# Patient Record
Sex: Female | Born: 1960 | Race: White | Hispanic: No | Marital: Married | Smoking: Never smoker
Health system: Southern US, Community
[De-identification: ages and names within clinical notes are randomized; demographics above are authoritative.]

## PROBLEM LIST (undated history)

## (undated) DIAGNOSIS — T7840XA Allergy, unspecified, initial encounter: Secondary | ICD-10-CM

## (undated) DIAGNOSIS — C4491 Basal cell carcinoma of skin, unspecified: Secondary | ICD-10-CM

## (undated) DIAGNOSIS — E559 Vitamin D deficiency, unspecified: Secondary | ICD-10-CM

## (undated) DIAGNOSIS — Z86018 Personal history of other benign neoplasm: Secondary | ICD-10-CM

## (undated) DIAGNOSIS — Z85828 Personal history of other malignant neoplasm of skin: Secondary | ICD-10-CM

## (undated) DIAGNOSIS — G4730 Sleep apnea, unspecified: Secondary | ICD-10-CM

## (undated) DIAGNOSIS — M1990 Unspecified osteoarthritis, unspecified site: Secondary | ICD-10-CM

## (undated) DIAGNOSIS — R002 Palpitations: Secondary | ICD-10-CM

## (undated) HISTORY — PX: MYOMECTOMY: SHX85

## (undated) HISTORY — PX: SPINE SURGERY: SHX786

## (undated) HISTORY — PX: JOINT REPLACEMENT: SHX530

## (undated) HISTORY — DX: Allergy, unspecified, initial encounter: T78.40XA

## (undated) HISTORY — DX: Vitamin D deficiency, unspecified: E55.9

## (undated) HISTORY — PX: LAPAROSCOPY: SHX197

## (undated) HISTORY — PX: BIOPSY BREAST: PRO8

## (undated) HISTORY — PX: THYMECTOMY: SHX1063

## (undated) HISTORY — PX: TONSILLECTOMY: SUR1361

## (undated) HISTORY — PX: BACK SURGERY: SHX140

## (undated) HISTORY — PX: ANTERIOR CRUCIATE LIGAMENT REPAIR: SHX115

## (undated) HISTORY — PX: PAROTIDECTOMY: SUR1003

## (undated) HISTORY — DX: Sleep apnea, unspecified: G47.30

## (undated) HISTORY — PX: ARTHROSCOPIC REPAIR ACL: SUR80

---
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
## 2001-05-04 DIAGNOSIS — C4491 Basal cell carcinoma of skin, unspecified: Secondary | ICD-10-CM

## 2001-05-04 DIAGNOSIS — Z85828 Personal history of other malignant neoplasm of skin: Secondary | ICD-10-CM

## 2001-05-04 HISTORY — DX: Basal cell carcinoma of skin, unspecified: C44.91

## 2001-05-04 HISTORY — DX: Personal history of other malignant neoplasm of skin: Z85.828

## 2004-03-09 ENCOUNTER — Ambulatory Visit: Payer: Self-pay

## 2005-04-21 ENCOUNTER — Ambulatory Visit: Payer: Self-pay

## 2006-04-27 ENCOUNTER — Ambulatory Visit: Payer: Self-pay

## 2006-05-08 ENCOUNTER — Ambulatory Visit: Payer: Self-pay

## 2007-01-24 ENCOUNTER — Ambulatory Visit: Payer: Self-pay | Admitting: General Surgery

## 2007-07-26 ENCOUNTER — Ambulatory Visit: Payer: Self-pay | Admitting: Family Medicine

## 2008-02-22 HISTORY — PX: BREAST BIOPSY: SHX20

## 2008-11-25 ENCOUNTER — Ambulatory Visit: Payer: Self-pay | Admitting: Family Medicine

## 2008-12-04 ENCOUNTER — Ambulatory Visit: Payer: Self-pay | Admitting: Family Medicine

## 2009-02-21 ENCOUNTER — Ambulatory Visit: Payer: Self-pay | Admitting: Internal Medicine

## 2009-02-23 DIAGNOSIS — Z86018 Personal history of other benign neoplasm: Secondary | ICD-10-CM

## 2009-02-23 HISTORY — DX: Personal history of other benign neoplasm: Z86.018

## 2009-03-02 ENCOUNTER — Emergency Department: Payer: Self-pay | Admitting: Emergency Medicine

## 2009-03-05 ENCOUNTER — Ambulatory Visit: Payer: Self-pay | Admitting: Family Medicine

## 2009-03-11 ENCOUNTER — Ambulatory Visit: Payer: Self-pay | Admitting: Internal Medicine

## 2009-03-24 ENCOUNTER — Ambulatory Visit: Payer: Self-pay | Admitting: Internal Medicine

## 2009-04-21 ENCOUNTER — Ambulatory Visit: Payer: Self-pay | Admitting: Internal Medicine

## 2009-04-29 ENCOUNTER — Ambulatory Visit: Payer: Self-pay | Admitting: Cardiothoracic Surgery

## 2009-05-22 ENCOUNTER — Ambulatory Visit: Payer: Self-pay | Admitting: Internal Medicine

## 2009-05-27 ENCOUNTER — Ambulatory Visit: Payer: Self-pay | Admitting: Cardiothoracic Surgery

## 2009-05-27 ENCOUNTER — Ambulatory Visit: Payer: Self-pay | Admitting: Internal Medicine

## 2009-06-21 ENCOUNTER — Ambulatory Visit: Payer: Self-pay | Admitting: Internal Medicine

## 2009-07-06 ENCOUNTER — Ambulatory Visit: Payer: Self-pay | Admitting: Cardiothoracic Surgery

## 2009-07-08 ENCOUNTER — Ambulatory Visit: Payer: Self-pay | Admitting: Internal Medicine

## 2009-09-21 ENCOUNTER — Ambulatory Visit: Payer: Self-pay | Admitting: Internal Medicine

## 2009-10-09 ENCOUNTER — Ambulatory Visit: Payer: Self-pay | Admitting: Internal Medicine

## 2009-10-14 ENCOUNTER — Ambulatory Visit: Payer: Self-pay | Admitting: Internal Medicine

## 2010-01-26 ENCOUNTER — Ambulatory Visit: Payer: Self-pay

## 2010-04-16 ENCOUNTER — Ambulatory Visit: Payer: Self-pay

## 2010-04-22 ENCOUNTER — Ambulatory Visit: Payer: Self-pay | Admitting: Internal Medicine

## 2010-05-01 ENCOUNTER — Emergency Department: Payer: Self-pay | Admitting: Emergency Medicine

## 2010-05-07 ENCOUNTER — Ambulatory Visit: Payer: Self-pay | Admitting: Unknown Physician Specialty

## 2010-05-23 ENCOUNTER — Ambulatory Visit: Payer: Self-pay | Admitting: Internal Medicine

## 2011-03-15 ENCOUNTER — Ambulatory Visit: Payer: Self-pay | Admitting: Family Medicine

## 2011-04-21 ENCOUNTER — Ambulatory Visit: Payer: Self-pay | Admitting: Cardiothoracic Surgery

## 2011-04-28 ENCOUNTER — Ambulatory Visit: Payer: Self-pay | Admitting: Internal Medicine

## 2011-05-23 ENCOUNTER — Ambulatory Visit: Payer: Self-pay | Admitting: Internal Medicine

## 2011-09-29 ENCOUNTER — Ambulatory Visit: Payer: Self-pay | Admitting: Family Medicine

## 2011-10-27 ENCOUNTER — Ambulatory Visit: Payer: Self-pay | Admitting: Orthopedic Surgery

## 2012-02-02 ENCOUNTER — Ambulatory Visit: Payer: Self-pay | Admitting: Family Medicine

## 2012-02-02 LAB — HM HEPATITIS C SCREENING LAB: HM Hepatitis Screen: NEGATIVE

## 2012-03-15 ENCOUNTER — Ambulatory Visit: Payer: Self-pay | Admitting: Family Medicine

## 2012-04-21 ENCOUNTER — Ambulatory Visit: Payer: Self-pay | Admitting: Cardiothoracic Surgery

## 2012-04-23 ENCOUNTER — Ambulatory Visit: Payer: Self-pay | Admitting: Cardiothoracic Surgery

## 2012-05-22 ENCOUNTER — Ambulatory Visit: Payer: Self-pay | Admitting: Cardiothoracic Surgery

## 2013-03-18 ENCOUNTER — Ambulatory Visit: Payer: Self-pay | Admitting: Family Medicine

## 2013-04-25 ENCOUNTER — Ambulatory Visit: Payer: Self-pay | Admitting: Cardiothoracic Surgery

## 2013-05-02 ENCOUNTER — Ambulatory Visit: Payer: Self-pay | Admitting: Internal Medicine

## 2013-05-22 ENCOUNTER — Ambulatory Visit: Payer: Self-pay | Admitting: Internal Medicine

## 2014-02-19 ENCOUNTER — Ambulatory Visit: Payer: Self-pay | Admitting: Family Medicine

## 2014-02-19 LAB — HM PAP SMEAR: HM PAP: NEGATIVE

## 2014-02-26 LAB — BASIC METABOLIC PANEL
BUN: 14 mg/dL (ref 4–21)
Creatinine: 0.8 mg/dL (ref <=1.1)
Glucose: 94 mg/dL
Potassium: 4.7 mmol/L (ref 3.4–5.3)
Sodium: 144 mmol/L (ref 137–147)

## 2014-02-26 LAB — HEPATIC FUNCTION PANEL
ALT: 22 U/L (ref 7–35)
AST: 25 U/L (ref 13–35)
Alkaline Phosphatase: 56 U/L (ref 25–125)
BILIRUBIN, TOTAL: 0.7 mg/dL

## 2014-02-26 LAB — TSH: TSH: 2.08 u[IU]/mL (ref <=5.90)

## 2014-02-26 LAB — CBC AND DIFFERENTIAL
HEMATOCRIT: 41 % (ref 36–46)
Hemoglobin: 14.2 g/dL (ref 12.0–16.0)
Neutrophils Absolute: 49 /uL
Platelets: 236 10*3/uL (ref 150–399)
WBC: 5.2 10*3/mL

## 2014-02-26 LAB — LIPID PANEL
Cholesterol: 259 mg/dL — AB (ref 0–200)
HDL: 58 mg/dL (ref 35–70)
LDL CALC: 180 mg/dL
LDL/HDL RATIO: 3.1
TRIGLYCERIDES: 106 mg/dL (ref 40–160)

## 2014-03-20 ENCOUNTER — Ambulatory Visit: Payer: Self-pay | Admitting: Family Medicine

## 2014-04-30 ENCOUNTER — Ambulatory Visit: Payer: Self-pay | Admitting: Cardiothoracic Surgery

## 2014-05-02 ENCOUNTER — Ambulatory Visit
Admit: 2014-05-02 | Disposition: A | Discharge status: Home / Self Care | Payer: Self-pay | Attending: Internal Medicine | Admitting: Internal Medicine

## 2014-05-23 ENCOUNTER — Ambulatory Visit (INDEPENDENT_AMBULATORY_CARE_PROVIDER_SITE_OTHER): Payer: 59 | Admitting: Cardiovascular Disease

## 2014-05-23 ENCOUNTER — Encounter (INDEPENDENT_AMBULATORY_CARE_PROVIDER_SITE_OTHER): Payer: Self-pay

## 2014-05-23 ENCOUNTER — Encounter: Payer: Self-pay | Admitting: Cardiovascular Disease

## 2014-05-23 VITALS — BP 120/82 | HR 92 | Ht 68.0 in | Wt 160.0 lb

## 2014-05-23 DIAGNOSIS — Z87898 Personal history of other specified conditions: Secondary | ICD-10-CM | POA: Diagnosis not present

## 2014-05-23 DIAGNOSIS — R079 Chest pain, unspecified: Secondary | ICD-10-CM | POA: Diagnosis not present

## 2014-05-23 DIAGNOSIS — R000 Tachycardia, unspecified: Secondary | ICD-10-CM

## 2014-05-23 DIAGNOSIS — E785 Hyperlipidemia, unspecified: Secondary | ICD-10-CM | POA: Diagnosis not present

## 2014-05-23 DIAGNOSIS — E782 Mixed hyperlipidemia: Secondary | ICD-10-CM | POA: Insufficient documentation

## 2014-05-23 DIAGNOSIS — Z86018 Personal history of other benign neoplasm: Secondary | ICD-10-CM

## 2014-05-23 NOTE — Assessment & Plan Note (Signed)
We had a long discussion concerning her history of tachycardia, arrhythmia, various treatments and medication she has used. Type of arrhythmia is unclear at this time, unable to exclude atrial fibrillation, flutter, SVT or atrial tachycardia Symptoms are happening sporadically. Discussed the various options for management and she prefers a 30 day monitor to help identify her arrhythmia. This was ordered for her today. We did suggest after one or 2 weeks, that she consider changing her Toprol to evening dosing. If she continues to have symptoms, could increase the dose up to 75 mg daily. If this causes fatigue as before, could try bystolic. She does not want propranolol If other arrhythmia noted, could potentially try diltiazem or verapamil. We did touch on using antiarrhythmics depending on her rhythm

## 2014-05-23 NOTE — Progress Notes (Signed)
Patient ID: Hannah Gonzalez, female    DOB: [birth year], 54 y.o.   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  She reports that she had tachycardia as a child and was managed on propranolol. She states this caused eye problems and her physician weaned her off. She has put up with tachycardia for some time since then  15-16 years ago she woke up with general malaise one morning, while in the shower had syncope. Found to have very low blood pressure, erratic heart rate. Sent to the hospital and was noted to have tachycardia while in the hospital. She was offered ablation, started metoprolol 50 mg daily. 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 Faith Rogue  She reports her mother has history of A. fib, father with pacemaker, died of a stroke  EKG on today's visit shows normal sinus rhythm with rate 92 bpm, no significant ST or T-wave changes EKG from January 2011 shows normal sinus rhythm with rate 70 bpm, no significant ST or T-wave changes Echocardiogram from October 2001 is essentially normal  Lab work reviewed showing hematocrit 41, total cholesterol 259, LDL 180, HDL 58, TSH 2.0, creatinine 0.85, potassium 4.7   No Known Allergies  Outpatient Encounter  Prescriptions as of 05/23/2014  Medication Sig  . Cetirizine HCl 10 MG CAPS Take 10 mg by mouth daily.   . Cholecalciferol 1000 UNITS tablet Take 1,000 Units by mouth daily.   . Cranberry-Vitamin C-Vitamin E 420-100-3 MG-MG-UNIT CAPS Take 4,200 mg by mouth daily.   . Flaxseed MISC 1,400 mg daily. Use.  . metoprolol succinate (TOPROL-XL) 50 MG 24 hr tablet Take 50 mg by mouth daily.   . Multiple Vitamins-Minerals (MENS 50+ MULTI VITAMIN/MIN PO) Take by mouth daily.  . Omega-3 Fatty Acids (FISH OIL) 1000 MG CAPS Take 1,200 mg by mouth daily.   Marland Kitchen omeprazole (PRILOSEC) 20 MG capsule Take 20 mg by mouth daily.   Marland Kitchen tolterodine (DETROL LA) 4 MG 24 hr capsule Take 4 mg by mouth daily.     Past Medical History  Diagnosis Date  . Vitamin D deficiency   . GERD (gastroesophageal reflux disease)   . GAD (generalized anxiety disorder)     Past Surgical History  Procedure Laterality Date  . Tonsillectomy    . Arthroscopic repair acl      left  . Myomectomy    . Parotidectomy    . Biopsy breast      left   . Thymectomy      Social History  reports that she has never smoked. She does not have any smokeless tobacco history on file. She reports that she does not drink alcohol or use illicit drugs.  Family History family history includes Arrhythmia in her mother; Heart disease in her mother; Heart failure  in her mother; Hyperlipidemia in her brother and mother; Hypertension in her brother and mother.   Review of Systems  Constitutional: Negative.   Respiratory: Negative.   Cardiovascular: Negative.   Gastrointestinal: Negative.   Musculoskeletal: Negative.   Skin: Negative.   Neurological: Negative.   Hematological: Negative.   Psychiatric/Behavioral: Negative.   All other systems reviewed and are negative.   BP 120/82 mmHg  Pulse 92  Ht 5\' 8"  (1.727 m)  Wt 160 lb (72.576 kg)  BMI 24.33 kg/m2  Physical Exam  Constitutional: She is oriented to person, place, and time. She appears  well-developed and well-nourished.  HENT:  Head: Normocephalic.  Nose: Nose normal.  Mouth/Throat: Oropharynx is clear and moist.  Eyes: Conjunctivae are normal. Pupils are equal, round, and reactive to light.  Neck: Normal range of motion. Neck supple. No JVD present.  Cardiovascular: Normal rate, regular rhythm, S1 normal, S2 normal, normal heart sounds and intact distal pulses.  Exam reveals no gallop and no friction rub.   No murmur heard. Pulmonary/Chest: Effort normal and breath sounds normal. No respiratory distress. She has no wheezes. She has no rales. She exhibits no tenderness.  Abdominal: Soft. Bowel sounds are normal. She exhibits no distension. There is no tenderness.  Musculoskeletal: Normal range of motion. She exhibits no edema or tenderness.  Lymphadenopathy:    She has no cervical adenopathy.  Neurological: She is alert and oriented to person, place, and time. Coordination normal.  Skin: Skin is warm and dry. No rash noted. No erythema.  Psychiatric: She has a normal mood and affect. Her behavior is normal. Judgment and thought content normal.    Assessment and Plan  Nursing note and vitals reviewed.

## 2014-05-23 NOTE — Assessment & Plan Note (Signed)
Details are not available. She has had a resection, followed by Dr. Faith Rogue

## 2014-05-23 NOTE — Patient Instructions (Signed)
You are doing well. No medication changes were made.  We will order a 30 day monitor for tachycardia, chest pain  At a later date, consider changing the toprol to evening  Please call us if you have new issues that need to be addressed before your next appt.  Your physician wants you to follow-up in: 6 weeks

## 2014-05-23 NOTE — Assessment & Plan Note (Signed)
Total cholesterol and LDL are elevated. We will discuss this with her on her next visit

## 2014-05-26 DIAGNOSIS — R000 Tachycardia, unspecified: Secondary | ICD-10-CM

## 2014-07-08 ENCOUNTER — Telehealth: Payer: Self-pay

## 2014-07-08 ENCOUNTER — Ambulatory Visit (INDEPENDENT_AMBULATORY_CARE_PROVIDER_SITE_OTHER): Payer: 59 | Admitting: Cardiovascular Disease

## 2014-07-08 ENCOUNTER — Encounter: Payer: Self-pay | Admitting: Cardiovascular Disease

## 2014-07-08 VITALS — BP 120/86 | HR 67 | Ht 68.0 in | Wt 159.5 lb

## 2014-07-08 DIAGNOSIS — R000 Tachycardia, unspecified: Secondary | ICD-10-CM | POA: Diagnosis not present

## 2014-07-08 DIAGNOSIS — E785 Hyperlipidemia, unspecified: Secondary | ICD-10-CM | POA: Diagnosis not present

## 2014-07-08 NOTE — Telephone Encounter (Signed)
Reviewed results of 30 day monitor w/ pt:  "NSR w/ no significant arrythmia".  She verbalizes understanding, though she does voice some frustration as to what is causing her sx.  She will keep her appt w/ Dr. Rockey Situ this afternoon to discuss.

## 2014-07-08 NOTE — Assessment & Plan Note (Signed)
Currently not on a cholesterol medication. Will defer to primary care. No other risk factors

## 2014-07-08 NOTE — Patient Instructions (Signed)
You are doing well. No medication changes were made.  Please call us if you have new issues that need to be addressed before your next appt.  Your physician wants you to follow-up in: 12 months.  You will receive a reminder letter in the mail two months in advance. If you don't receive a letter, please call our office to schedule the follow-up appointment. 

## 2014-07-08 NOTE — Assessment & Plan Note (Signed)
Heart rate well controlled on the 30 day monitor. No significant arrhythmia noted. We have offered propranolol for breakthrough tachycardia. Also suggested she could try bystolic if she has side effects on metoprolol

## 2014-07-08 NOTE — Progress Notes (Signed)
Patient ID: Hannah Gonzalez, female    DOB: Dec 22, 1960, 54 y.o.   MRN: [id number]  HPI Comments: [name] is a 54 year old woman with long history of tachycardia, arrhythmia dating back to when she was young who presents for evaluation of her tachycardia. She presents today for follow-up after recent 30 day monitor  30 day monitor reviewed with her showing normal sinus rhythm, no significant arrhythmia, average heart rate 80-90 bpm  In general she is tolerating metoprolol succinate 50 mg in the morning. She does not want a higher dose secondary to side effects such as fatigue. Most of her symptoms are at nighttime.  In the past she has tried propranolol Previously she reported having eye problems on propranolol  and her physician weaned her off.  No EKG performed today  Other past medical history 15-16 years ago she woke up with general malaise one morning, while in the shower had syncope. Found to have very low blood pressure, erratic heart rate. Sent to the hospital and was noted to have tachycardia while in the hospital. She was offered ablation, started metoprolol 50 mg daily. She has tried metoprolol tartrate but this did not seem to work for her She tried metoprolol succinate 100 mg daily but this caused fatigue Since then she has stayed on metoprolol succinate 50 mg daily   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 Faith Rogue  She reports her mother has history of A. fib, father with pacemaker, died of a stroke  Echocardiogram from October 2001 is essentially normal  Lab work reviewed showing hematocrit 41, total cholesterol 259, LDL 180, HDL 58, TSH 2.0, creatinine 0.85, potassium 4.7   No Known Allergies  Outpatient Encounter Prescriptions as of 07/08/2014  Medication Sig  . Cetirizine HCl 10 MG CAPS Take 10 mg by mouth daily.   . Cholecalciferol 1000 UNITS tablet Take 1,000 Units by mouth daily.   . Cranberry-Vitamin C-Vitamin E 420-100-3  MG-MG-UNIT CAPS Take 4,200 mg by mouth daily.   . Flaxseed MISC 1,400 mg daily. Use.  . metoprolol succinate (TOPROL-XL) 50 MG 24 hr tablet Take 50 mg by mouth daily.   . Multiple Vitamins-Minerals (MENS 50+ MULTI VITAMIN/MIN PO) Take by mouth daily.  . Omega-3 Fatty Acids (FISH OIL) 1000 MG CAPS Take 1,200 mg by mouth daily.   Marland Kitchen omeprazole (PRILOSEC) 20 MG capsule Take 20 mg by mouth daily.   Marland Kitchen tolterodine (DETROL LA) 4 MG 24 hr capsule Take 4 mg by mouth daily.    No facility-administered encounter medications on file as of 07/08/2014.    Past Medical History  Diagnosis Date  . Vitamin D deficiency   . GERD (gastroesophageal reflux disease)   . GAD (generalized anxiety disorder)     Past Surgical History  Procedure Laterality Date  . Tonsillectomy    . Arthroscopic repair acl      left  . Myomectomy    . Parotidectomy    . Biopsy breast      left   . Thymectomy    . Laparoscopy    . Anterior cruciate ligament repair      Social History  reports that she has never smoked. She does not have any smokeless tobacco history on file. She reports that she does not drink alcohol or use illicit drugs.  Family History family history includes Arrhythmia in her mother; Arthritis in her father and mother; Heart disease in her mother; Heart failure in her mother; Hyperlipidemia in her  brother and mother; Hypertension in her brother, father, and mother; Prostate cancer in her father.   Review of Systems  Constitutional: Negative.   Respiratory: Negative.   Cardiovascular: Positive for palpitations.  Gastrointestinal: Negative.   Musculoskeletal: Negative.   Skin: Negative.   Neurological: Negative.   Hematological: Negative.   Psychiatric/Behavioral: Negative.   All other systems reviewed and are negative.   BP 120/86 mmHg  Pulse 67  Ht 5\' 8"  (1.727 m)  Wt 159 lb 8 oz (72.349 kg)  BMI 24.26 kg/m2  Physical Exam  Constitutional: She is oriented to person, place, and time.  She appears well-developed and well-nourished.  HENT:  Head: Normocephalic.  Nose: Nose normal.  Mouth/Throat: Oropharynx is clear and moist.  Eyes: Conjunctivae are normal. Pupils are equal, round, and reactive to light.  Neck: Normal range of motion. Neck supple. No JVD present.  Cardiovascular: Normal rate, regular rhythm, S1 normal, S2 normal, normal heart sounds and intact distal pulses.  Exam reveals no gallop and no friction rub.   No murmur heard. Pulmonary/Chest: Effort normal and breath sounds normal. No respiratory distress. She has no wheezes. She has no rales. She exhibits no tenderness.  Abdominal: Soft. Bowel sounds are normal. She exhibits no distension. There is no tenderness.  Musculoskeletal: Normal range of motion. She exhibits no edema or tenderness.  Lymphadenopathy:    She has no cervical adenopathy.  Neurological: She is alert and oriented to person, place, and time. Coordination normal.  Skin: Skin is warm and dry. No rash noted. No erythema.  Psychiatric: She has a normal mood and affect. Her behavior is normal. Judgment and thought content normal.    Assessment and Plan  Nursing note and vitals reviewed.

## 2014-07-29 ENCOUNTER — Other Ambulatory Visit: Payer: Self-pay

## 2014-07-29 DIAGNOSIS — R000 Tachycardia, unspecified: Secondary | ICD-10-CM

## 2014-08-01 ENCOUNTER — Encounter (INDEPENDENT_AMBULATORY_CARE_PROVIDER_SITE_OTHER): Payer: 59

## 2014-08-01 ENCOUNTER — Other Ambulatory Visit: Payer: Self-pay

## 2014-08-01 DIAGNOSIS — R000 Tachycardia, unspecified: Secondary | ICD-10-CM

## 2014-08-26 ENCOUNTER — Encounter: Payer: Self-pay | Admitting: Family Medicine

## 2014-08-26 ENCOUNTER — Ambulatory Visit (INDEPENDENT_AMBULATORY_CARE_PROVIDER_SITE_OTHER): Payer: 59 | Admitting: Family Medicine

## 2014-08-26 VITALS — BP 122/80 | HR 78 | Temp 98.4°F | Resp 16 | Wt 159.0 lb

## 2014-08-26 DIAGNOSIS — F411 Generalized anxiety disorder: Secondary | ICD-10-CM | POA: Insufficient documentation

## 2014-08-26 DIAGNOSIS — K219 Gastro-esophageal reflux disease without esophagitis: Secondary | ICD-10-CM | POA: Diagnosis not present

## 2014-08-26 DIAGNOSIS — E7800 Pure hypercholesterolemia, unspecified: Secondary | ICD-10-CM | POA: Insufficient documentation

## 2014-08-26 DIAGNOSIS — F329 Major depressive disorder, single episode, unspecified: Secondary | ICD-10-CM | POA: Insufficient documentation

## 2014-08-26 DIAGNOSIS — I10 Essential (primary) hypertension: Secondary | ICD-10-CM | POA: Diagnosis not present

## 2014-08-26 DIAGNOSIS — N318 Other neuromuscular dysfunction of bladder: Secondary | ICD-10-CM | POA: Insufficient documentation

## 2014-08-26 DIAGNOSIS — E559 Vitamin D deficiency, unspecified: Secondary | ICD-10-CM | POA: Insufficient documentation

## 2014-08-26 DIAGNOSIS — D150 Benign neoplasm of thymus: Secondary | ICD-10-CM

## 2014-08-26 DIAGNOSIS — D4989 Neoplasm of unspecified behavior of other specified sites: Secondary | ICD-10-CM | POA: Insufficient documentation

## 2014-08-26 NOTE — Progress Notes (Signed)
Patient ID: Hannah Gonzalez, female   DOB: Jul 13, 1960, 54 y.o.   MRN: [id number]    Subjective:  Hypertension This is a chronic problem. The problem is controlled. Associated symptoms include anxiety. Pertinent negatives include no blurred vision, headaches, malaise/fatigue, neck pain, orthopnea, palpitations, peripheral edema, PND, shortness of breath or sweats. Risk factors for coronary artery disease include dyslipidemia.  Gastrophageal Reflux She reports no abdominal pain, no belching, no choking, no coughing, no dysphagia, no early satiety, no globus sensation, no heartburn, no hoarse voice, no nausea, no sore throat, no stridor, no tooth decay, no water brash or no wheezing. This is a chronic problem. The problem has been unchanged.   Pt reports that she is doing well overall.   Prior to Admission medications   Medication Sig Start Date End Date Taking? Authorizing Provider  Cetirizine HCl 10 MG CAPS Take 10 mg by mouth daily.    Yes Historical Provider, MD  Cholecalciferol 1000 UNITS tablet Take 1,000 Units by mouth daily.    Yes Historical Provider, MD  Cranberry-Vitamin C-Vitamin E 420-100-3 MG-MG-UNIT CAPS Take 4,200 mg by mouth daily.    Yes Historical Provider, MD  Flaxseed MISC 1,400 mg daily. Use.   Yes Historical Provider, MD  metoprolol succinate (TOPROL-XL) 50 MG 24 hr tablet Take 50 mg by mouth daily.  05/19/13  Yes Historical Provider, MD  Multiple Vitamins-Minerals (MENS 50+ MULTI VITAMIN/MIN PO) Take by mouth daily.   Yes Historical Provider, MD  Omega-3 Fatty Acids (FISH OIL) 1000 MG CAPS Take 1,200 mg by mouth daily.    Yes Historical Provider, MD  omeprazole (PRILOSEC) 20 MG capsule Take 20 mg by mouth daily.  05/20/14  Yes Historical Provider, MD  tolterodine (DETROL LA) 4 MG 24 hr capsule Take 4 mg by mouth daily.  05/19/13  Yes Historical Provider, MD    Patient Active Problem List   Diagnosis Date Noted  . Avitaminosis D 08/26/2014  . Anxiety, generalized  08/26/2014  . Acid reflux 08/26/2014  . Hypercholesteremia 08/26/2014  . BP (high blood pressure) 08/26/2014  . 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 Tachycardia 05/23/2014  . Hyperlipidemia 05/23/2014  . H/O thymoma 05/23/2014    Past Medical History  Diagnosis Date  . Vitamin D deficiency   . GERD (gastroesophageal reflux disease)   . GAD (generalized anxiety disorder)     History   Social History  . Marital Status: Married    Spouse Name: N/A  . Number of Children: N/A  . Years of Education: N/A   Occupational History  . Not on file.   Social History Main Topics  . Smoking status: Never Smoker   . Smokeless tobacco: Not on file  . Alcohol Use: No  . Drug Use: No  . Sexual Activity: Not on file   Other Topics Concern  . Not on file   Social History Narrative    No Known Allergies  Review of Systems  Constitutional: Negative for malaise/fatigue.  HENT: Negative for hoarse voice and sore throat.   Eyes: Negative for blurred vision.  Respiratory: Negative for cough, choking, shortness of breath and wheezing.   Cardiovascular: Negative for palpitations, orthopnea and PND.  Gastrointestinal: Negative for heartburn, dysphagia, nausea and abdominal pain.  Musculoskeletal: Negative for neck pain.  Neurological: Negative.  Negative for headaches.  Endo/Heme/Allergies: Negative.   Psychiatric/Behavioral: Negative.   All other systems reviewed and are negative.   Immunization History  Administered Date(s) Administered  . Tdap 08/30/2005   Objective:  BP 122/80 mmHg  Pulse 78  Temp(Src) 98.4 F (36.9 C) (Oral)  Resp 16  Wt 159 lb (72.122 kg)  Physical Exam  Constitutional: She is oriented to person, place, and time and well-developed, well-nourished, and in no distress.  HENT:  Head: Normocephalic and atraumatic.  Right Ear: External ear normal.  Left Ear: External ear normal.  Nose: Nose  normal.  Eyes: Conjunctivae are normal. Pupils are equal, round, and reactive to light.  Neck: Normal range of motion. Neck supple.  Cardiovascular: Normal rate, regular rhythm and normal heart sounds.   Pulmonary/Chest: Effort normal and breath sounds normal.  Abdominal: Soft. Bowel sounds are normal.  Neurological: She is alert and oriented to person, place, and time. Gait normal.  Skin: Skin is warm and dry.  Very fair skin, reddish hair.  Psychiatric: Mood, memory, affect and judgment normal.  Vitals reviewed.   Lab Results  Component Value Date   WBC 5.2 02/26/2014   HGB 14.2 02/26/2014   HCT 41 02/26/2014   PLT 236 02/26/2014   CHOL 259* 02/26/2014   TRIG 106 02/26/2014   HDL 58 02/26/2014   LDLCALC 180 02/26/2014   TSH 2.08 02/26/2014    CMP     Component Value Date/Time   NA 144 02/26/2014   K 4.7 02/26/2014   BUN 14 02/26/2014   CREATININE 0.8 02/26/2014   AST 25 02/26/2014   ALT 22 02/26/2014   ALKPHOS 56 02/26/2014    Assessment and Plan :  Hypertension Stable  Chronic GERD Stable  Status post removal of thymoma Patient clinically doing well.  Anxiety Clinically stable. Father is deceased, mother in failing health. Patient has been working with [employer] for 35 years. Her job is not enjoyable anymore for her. No retirement plans.  I have done the exam and reviewed the above chart and it is accurate to the best of my knowledge.    Miguel Aschoff MD [facility] 08/26/2014 4:31 PM

## 2014-09-25 ENCOUNTER — Other Ambulatory Visit: Payer: Self-pay | Admitting: Family Medicine

## 2014-10-30 ENCOUNTER — Other Ambulatory Visit: Payer: Self-pay

## 2015-01-24 ENCOUNTER — Other Ambulatory Visit: Payer: Self-pay | Admitting: Family Medicine

## 2015-03-04 ENCOUNTER — Ambulatory Visit (INDEPENDENT_AMBULATORY_CARE_PROVIDER_SITE_OTHER): Payer: 59 | Admitting: Family Medicine

## 2015-03-04 ENCOUNTER — Encounter: Payer: Self-pay | Admitting: Family Medicine

## 2015-03-04 VITALS — BP 118/72 | HR 94 | Temp 98.1°F | Resp 12 | Ht 69.0 in | Wt 165.0 lb

## 2015-03-04 DIAGNOSIS — I10 Essential (primary) hypertension: Secondary | ICD-10-CM

## 2015-03-04 DIAGNOSIS — Z1239 Encounter for other screening for malignant neoplasm of breast: Secondary | ICD-10-CM

## 2015-03-04 DIAGNOSIS — G8929 Other chronic pain: Secondary | ICD-10-CM

## 2015-03-04 DIAGNOSIS — Z1211 Encounter for screening for malignant neoplasm of colon: Secondary | ICD-10-CM | POA: Diagnosis not present

## 2015-03-04 DIAGNOSIS — D369 Benign neoplasm, unspecified site: Secondary | ICD-10-CM

## 2015-03-04 DIAGNOSIS — Z0000 Encounter for general adult medical examination without abnormal findings: Secondary | ICD-10-CM

## 2015-03-04 DIAGNOSIS — M25551 Pain in right hip: Secondary | ICD-10-CM

## 2015-03-04 DIAGNOSIS — E7800 Pure hypercholesterolemia, unspecified: Secondary | ICD-10-CM

## 2015-03-04 LAB — POCT URINALYSIS DIPSTICK
BILIRUBIN UA: NEGATIVE
Blood, UA: NEGATIVE
Glucose, UA: NEGATIVE
KETONES UA: NEGATIVE
Leukocytes, UA: NEGATIVE
Nitrite, UA: NEGATIVE
PROTEIN UA: NEGATIVE
SPEC GRAV UA: 1.025
Urobilinogen, UA: NEGATIVE
pH, UA: 7

## 2015-03-04 NOTE — Progress Notes (Signed)
Patient ID: Hannah Gonzalez, female   DOB: 1960/04/20, 55 y.o.   MRN: [id number]  Visit Date: 03/04/2015  Today's Provider: [name], MD   Chief Complaint  Patient presents with  . Annual Exam   Subjective:  Hannah Gonzalez is a 55 y.o. female who presents today for health maintenance and complete physical. She feels fairly well. She reports exercising no. She reports she is sleeping well.  LAST: Pap smear with HPV 02/19/14 normal- never had a hysterectomy, never had abnormal pap smears  Mammogram 03/20/14  BMD 02/02/12 normal  Colonoscopy 05/07/10 repeat 04/2015-tubular adenoma  Tdap 08/30/05  Flu shot is up to date through work-had this done in October 2016.   Review of Systems  Constitutional: Negative.   HENT: Negative.   Eyes: Negative.   Respiratory: Negative.   Cardiovascular: Positive for palpitations. Negative for chest pain and leg swelling.  Gastrointestinal: Negative.   Endocrine: Negative.   Genitourinary: Positive for urgency.  Musculoskeletal: Positive for arthralgias.  Skin: Negative.   Allergic/Immunologic: Positive for environmental allergies.  Neurological: Negative.   Hematological: Negative.   Psychiatric/Behavioral: Negative.     Social History   Social History  . Marital Status: Married    Spouse Name: N/A  . Number of Children: N/A  . Years of Education: N/A   Occupational History  . Not on file.   Social History Main Topics  . Smoking status: Never Smoker   . Smokeless tobacco: Never Used  . Alcohol Use: No  . Drug Use: No  . Sexual Activity: Not on file   Other Topics Concern  . Not on file   Social History Narrative    Patient Active Problem List   Diagnosis Date Noted  . Avitaminosis D 08/26/2014  . Anxiety, generalized 08/26/2014  . Acid reflux 08/26/2014  . Hypercholesteremia 08/26/2014  . BP (high blood pressure) 08/26/2014  . Affective disorder, major ([place]) 08/26/2014  . Hypertonicity of bladder  08/26/2014  . 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 Tachycardia 05/23/2014  . Hyperlipidemia 05/23/2014  . H/O thymoma 05/23/2014    Past Surgical History  Procedure Laterality Date  . Tonsillectomy    . Arthroscopic repair acl      left  . Myomectomy    . Parotidectomy    . Biopsy breast      left   . Thymectomy    . Laparoscopy    . Anterior cruciate ligament repair      Her family history includes Arrhythmia in her mother; Arthritis in her father and mother; Cancer in her maternal aunt; Heart disease in her mother; Heart failure in her mother; Hyperlipidemia in her brother and mother; Hypertension in her brother, father, and mother; Prostate cancer in her father.    Outpatient Prescriptions Prior to Visit  Medication Sig Dispense Refill  . Cetirizine HCl 10 MG CAPS Take 10 mg by mouth daily.     . Cholecalciferol 1000 UNITS tablet Take 1,000 Units by mouth daily.     . Cranberry-Vitamin C-Vitamin E 420-100-3 MG-MG-UNIT CAPS Take 4,200 mg by mouth daily.     . Flaxseed MISC 1,400 mg daily. Use.    . Multiple Vitamins-Minerals (MENS 50+ MULTI VITAMIN/MIN PO) Take by mouth daily.    . Omega-3 Fatty Acids (FISH OIL) 1000 MG CAPS Take 1,200 mg by mouth daily.     Marland Kitchen omeprazole (PRILOSEC) 20 MG capsule Take 20 mg by mouth daily. Takes it 2 to 3 times a week    .  tolterodine (DETROL LA) 4 MG 24 hr capsule Take 1 capsule by mouth  daily 90 capsule 3  . TOPROL XL 50 MG 24 hr tablet Take 1 tablet by mouth  daily 90 tablet 3   No facility-administered medications prior to visit.    Patient Care Team: [name]., MD as PCP - General (Family Medicine)     Objective:   Vitals:  Filed Vitals:   03/04/15 0925  BP: 118/72  Pulse: 94  Temp: 98.1 F (36.7 C)  Resp: 12  Height: 5\' 9"  (1.753 m)  Weight: 165 lb (74.844 kg)    Physical Exam  Constitutional: She is oriented to person, place, and time. She appears well-developed and well-nourished.  HENT:  Head: Normocephalic and  atraumatic.  Right Ear: External ear normal.  Left Ear: External ear normal.  Mouth/Throat: Oropharynx is clear and moist.  Small tauri upper mouth.  Eyes: Conjunctivae are normal. Pupils are equal, round, and reactive to light.  Neck: Normal range of motion. Neck supple.  Cardiovascular: Normal rate, regular rhythm, normal heart sounds and intact distal pulses.   No murmur heard. Pulmonary/Chest: Effort normal and breath sounds normal. No respiratory distress. She has no wheezes.  Abdominal: Soft. Bowel sounds are normal. She exhibits no distension. There is no tenderness.  Musculoskeletal: She exhibits no edema.  Neurological: She is alert and oriented to person, place, and time.  Skin: Skin is warm and dry.  Psychiatric: She has a normal mood and affect. Her behavior is normal. Judgment and thought content normal.     Depression Screen PHQ 2/9 Scores 03/04/2015  PHQ - 2 Score 0      Assessment & Plan:  1. Annual physical exam Defer DRE today due to patient is due for colonoscopy. Check routine labs. Advised patient she does not need another pap smear until 2020.  2. Colon cancer screening - Ambulatory referral to Gastroenterology  3. Tubular adenoma - Ambulatory referral to Gastroenterology  4. Breast cancer screening - MM Digital Screening; Future  5. Chronic hip pain, right Chronic. Discussed this with patient at length today. Patient will decide if she wants to go back to see Dr. Mack Guise at any time and we can make that referral for her.can also obtain second opinion with Dr. Ricki Rodriguez or Dr. Alvan Dame in [place] I have done the exam and reviewed the above chart and it is accurate to the best of my knowledge.  Patient was seen and examined by Dr. Eulas Post and note was scribed by Theressa Millard, RMA.

## 2015-03-11 ENCOUNTER — Telehealth: Payer: Self-pay

## 2015-03-11 LAB — LIPID PANEL WITH LDL/HDL RATIO
Cholesterol, Total: 230 mg/dL — ABNORMAL HIGH (ref 100–199)
HDL: 54 mg/dL (ref >39)
LDL CALC: 156 mg/dL — AB (ref 0–99)
LDL/HDL RATIO: 2.9 ratio (ref 0.0–3.2)
Triglycerides: 99 mg/dL (ref 0–149)
VLDL Cholesterol Cal: 20 mg/dL (ref 5–40)

## 2015-03-11 LAB — COMPREHENSIVE METABOLIC PANEL
A/G RATIO: 2.1 (ref 1.1–2.5)
ALT: 19 IU/L (ref 0–32)
AST: 22 IU/L (ref 0–40)
Albumin: 4.2 g/dL (ref 3.5–5.5)
Alkaline Phosphatase: 57 IU/L (ref 39–117)
BUN/Creatinine Ratio: 20 (ref 9–23)
BUN: 17 mg/dL (ref 6–24)
Bilirubin Total: 0.3 mg/dL (ref 0.0–1.2)
CO2: 27 mmol/L (ref 18–29)
CREATININE: 0.85 mg/dL (ref 0.57–1.00)
Calcium: 9.1 mg/dL (ref 8.7–10.2)
Chloride: 102 mmol/L (ref 96–106)
GFR, EST AFRICAN AMERICAN: 89 mL/min/{1.73_m2} (ref >59)
GFR, EST NON AFRICAN AMERICAN: 77 mL/min/{1.73_m2} (ref >59)
GLOBULIN, TOTAL: 2 g/dL (ref 1.5–4.5)
Glucose: 96 mg/dL (ref 65–99)
POTASSIUM: 4.6 mmol/L (ref 3.5–5.2)
Sodium: 142 mmol/L (ref 134–144)
Total Protein: 6.2 g/dL (ref 6.0–8.5)

## 2015-03-11 LAB — CBC WITH DIFFERENTIAL/PLATELET
BASOS: 0 %
Basophils Absolute: 0 10*3/uL (ref 0.0–0.2)
EOS (ABSOLUTE): 0.1 10*3/uL (ref 0.0–0.4)
EOS: 2 %
HEMOGLOBIN: 13.2 g/dL (ref 11.1–15.9)
Hematocrit: 39.3 % (ref 34.0–46.6)
IMMATURE GRANS (ABS): 0 10*3/uL (ref 0.0–0.1)
Immature Granulocytes: 0 %
Lymphocytes Absolute: 2.2 10*3/uL (ref 0.7–3.1)
Lymphs: 49 %
MCH: 29.5 pg (ref 26.6–33.0)
MCHC: 33.6 g/dL (ref 31.5–35.7)
MCV: 88 fL (ref 79–97)
MONOCYTES: 6 %
Monocytes Absolute: 0.3 10*3/uL (ref 0.1–0.9)
Neutrophils Absolute: 2 10*3/uL (ref 1.4–7.0)
Neutrophils: 43 %
Platelets: 213 10*3/uL (ref 150–379)
RBC: 4.48 x10E6/uL (ref 3.77–5.28)
RDW: 13.7 % (ref 12.3–15.4)
WBC: 4.6 10*3/uL (ref 3.4–10.8)

## 2015-03-11 LAB — TSH: TSH: 3.17 u[IU]/mL (ref 0.450–4.500)

## 2015-03-11 NOTE — Telephone Encounter (Signed)
-----   Message from [name]., MD sent at 03/11/2015 11:33 AM EST ----- Labs stable

## 2015-03-11 NOTE — Telephone Encounter (Signed)
Left message to call back  

## 2015-03-12 NOTE — Telephone Encounter (Signed)
Patient advised.

## 2015-03-29 ENCOUNTER — Other Ambulatory Visit: Payer: Self-pay | Admitting: Family Medicine

## 2015-04-03 ENCOUNTER — Ambulatory Visit
Admission: RE | Admit: 2015-04-03 | Discharge: 2015-04-03 | Disposition: A | Discharge status: Home / Self Care | Payer: 59 | Source: Ambulatory Visit | Attending: Family Medicine | Admitting: Family Medicine

## 2015-04-03 DIAGNOSIS — Z1231 Encounter for screening mammogram for malignant neoplasm of breast: Secondary | ICD-10-CM | POA: Insufficient documentation

## 2015-04-03 DIAGNOSIS — Z1239 Encounter for other screening for malignant neoplasm of breast: Secondary | ICD-10-CM | POA: Insufficient documentation

## 2015-05-06 ENCOUNTER — Ambulatory Visit (INDEPENDENT_AMBULATORY_CARE_PROVIDER_SITE_OTHER): Payer: 59 | Admitting: Family Medicine

## 2015-05-06 ENCOUNTER — Encounter: Payer: Self-pay | Admitting: Family Medicine

## 2015-05-06 VITALS — BP 110/68 | HR 95 | Temp 97.7°F | Resp 18 | Wt 169.0 lb

## 2015-05-06 DIAGNOSIS — J329 Chronic sinusitis, unspecified: Secondary | ICD-10-CM

## 2015-05-06 MED ORDER — AMOXICILLIN-POT CLAVULANATE 875-125 MG PO TABS
1.0000 | ORAL_TABLET | Freq: Two times a day (BID) | ORAL | Status: DC
Start: 2015-05-06 — End: 2015-07-13

## 2015-05-06 MED ORDER — DOXYCYCLINE HYCLATE 100 MG PO TABS: 100.0000 mg | ORAL_TABLET | Freq: Two times a day (BID) | ORAL | Status: DC

## 2015-05-06 NOTE — Progress Notes (Signed)
Patient ID: Hannah Gonzalez, female   DOB: 09-04-1960, 55 y.o.   MRN: [id number]    Subjective:  HPI Pt is here for sinus problems. She was diagnoses with the flu about 10 days ago. She was given Tamiflu, she reports that she finished that but now she thinks that has turned into a sinus infection. She never got to feeling better since she had the flu but her body aches, fever all went away. Today she has sinus pain, pressure, congestion that is green, thick and bloody, and ear fullness. She still has a little cough.    Prior to Admission medications   Medication Sig Start Date End Date Taking? Authorizing Provider  Cetirizine HCl 10 MG CAPS Take 10 mg by mouth daily.    Yes Historical Provider, MD  Cholecalciferol 1000 UNITS tablet Take 1,000 Units by mouth daily.    Yes Historical Provider, MD  Cranberry-Vitamin C-Vitamin E 420-100-3 MG-MG-UNIT CAPS Take 4,200 mg by mouth daily.    Yes Historical Provider, MD  Flaxseed MISC 1,400 mg daily. Use.   Yes Historical Provider, MD  Multiple Vitamins-Minerals (MENS 50+ MULTI VITAMIN/MIN PO) Take by mouth daily.   Yes Historical Provider, MD  Omega-3 Fatty Acids (FISH OIL) 1000 MG CAPS Take 1,200 mg by mouth daily.    Yes Historical Provider, MD  omeprazole (PRILOSEC) 20 MG capsule Take 1 capsule by mouth  every morning 03/30/15  Yes [name]., MD  tolterodine (DETROL LA) 4 MG 24 hr capsule Take 1 capsule by mouth  daily 01/26/15  Yes [name]., MD  TOPROL XL 50 MG 24 hr tablet Take 1 tablet by mouth  daily 09/25/14  Yes [name]., MD    Patient Active Problem List   Diagnosis Date Noted  . Avitaminosis D 08/26/2014  . Anxiety, generalized 08/26/2014  . Acid reflux 08/26/2014  . Hypercholesteremia 08/26/2014  . BP (high blood pressure) 08/26/2014  . 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 Tachycardia 05/23/2014  . Hyperlipidemia 05/23/2014  .  H/O thymoma 05/23/2014    Past Medical History  Diagnosis Date  . Vitamin D deficiency   . GERD (gastroesophageal reflux disease)   . GAD (generalized anxiety disorder)     Social History   Social History  . Marital Status: Married    Spouse Name: N/A  . Number of Children: N/A  . Years of Education: N/A   Occupational History  . Not on file.   Social History Main Topics  . Smoking status: Never Smoker   . Smokeless tobacco: Never Used  . Alcohol Use: No  . Drug Use: No  . Sexual Activity: Not on file   Other Topics Concern  . Not on file   Social History Narrative    No Known Allergies  Review of Systems  Constitutional: Positive for malaise/fatigue.  HENT: Positive for congestion.        Sinus pain, pressure and behind her eyes  Eyes: Negative.   Respiratory: Positive for cough.   Cardiovascular: Negative.   Gastrointestinal: Negative.   Musculoskeletal: Negative.   Neurological: Positive for headaches.  Endo/Heme/Allergies: Negative.   Psychiatric/Behavioral: Negative.     Immunization History  Administered Date(s) Administered  . Influenza-Unspecified 12/03/2014  . Tdap 08/30/2005   Objective:  BP 110/68 mmHg  Pulse 95  Temp(Src) 97.7 F (36.5 C) (Oral)  Resp 18  Wt 169 lb (76.658 kg)  SpO2 98%  Physical Exam  Constitutional: She is oriented to person, place, and time and well-developed, well-nourished, and in no distress.  HENT:  Head: Normocephalic and atraumatic.  Right Ear: External ear normal.  Left Ear: External ear normal.  Nose: Nose normal.  Mouth/Throat: Oropharyngeal exudate present.  Mild maxillary sinus tenderness  Eyes: Conjunctivae are normal.  Neck: Neck supple.  Cardiovascular: Normal rate, regular rhythm and normal heart sounds.   Pulmonary/Chest: Effort normal and breath sounds normal. No respiratory distress.  Abdominal: Soft.  Lymphadenopathy:    She has no cervical adenopathy.  Neurological: She is alert and  oriented to person, place, and time.  Skin: Skin is warm and dry.  Psychiatric: Mood, memory, affect and judgment normal.    Lab Results  Component Value Date   WBC 4.6 03/10/2015   HGB 14.2 02/26/2014   HCT 39.3 03/10/2015   PLT 213 03/10/2015   GLUCOSE 96 03/10/2015   CHOL 230* 03/10/2015   TRIG 99 03/10/2015   HDL 54 03/10/2015   LDLCALC 156* 03/10/2015   TSH 3.170 03/10/2015    CMP     Component Value Date/Time   NA 142 03/10/2015 0854   K 4.6 03/10/2015 0854   CL 102 03/10/2015 0854   CO2 27 03/10/2015 0854   GLUCOSE 96 03/10/2015 0854   BUN 17 03/10/2015 0854   CREATININE 0.85 03/10/2015 0854   CREATININE 0.8 02/26/2014   CALCIUM 9.1 03/10/2015 0854   PROT 6.2 03/10/2015 0854   ALBUMIN 4.2 03/10/2015 0854   AST 22 03/10/2015 0854   ALT 19 03/10/2015 0854   ALKPHOS 57 03/10/2015 0854   BILITOT 0.3 03/10/2015 0854   GFRNONAA 77 03/10/2015 0854   GFRAA 89 03/10/2015 0854    Assessment and Plan :  1. Chronic sinusitis, unspecified location  - amoxicillin-clavulanate (AUGMENTIN) 875-125 MG tablet; Take 1 tablet by mouth 2 (two) times daily.  Dispense: 20 tablet; Refill: 1 - doxycycline (VIBRA-TABS) 100 MG tablet; Take 1 tablet (100 mg total) by mouth 2 (two) times daily.  Dispense: 20 tablet; Refill: 1 2. Viral URI Resolving. I have done the exam and reviewed the above chart and it is accurate to the best of my knowledge.   Miguel Aschoff MD [facility] 05/06/2015 3:00 PM

## 2015-05-19 LAB — HM COLONOSCOPY

## 2015-05-20 ENCOUNTER — Encounter: Payer: Self-pay | Admitting: Family Medicine

## 2015-07-13 ENCOUNTER — Ambulatory Visit (INDEPENDENT_AMBULATORY_CARE_PROVIDER_SITE_OTHER): Payer: 59 | Admitting: Cardiovascular Disease

## 2015-07-13 ENCOUNTER — Encounter: Payer: Self-pay | Admitting: Cardiovascular Disease

## 2015-07-13 VITALS — BP 110/78 | HR 73 | Ht 68.0 in | Wt 167.0 lb

## 2015-07-13 DIAGNOSIS — D150 Benign neoplasm of thymus: Secondary | ICD-10-CM | POA: Diagnosis not present

## 2015-07-13 DIAGNOSIS — R000 Tachycardia, unspecified: Secondary | ICD-10-CM

## 2015-07-13 DIAGNOSIS — E785 Hyperlipidemia, unspecified: Secondary | ICD-10-CM

## 2015-07-13 DIAGNOSIS — I10 Essential (primary) hypertension: Secondary | ICD-10-CM

## 2015-07-13 DIAGNOSIS — D4989 Neoplasm of unspecified behavior of other specified sites: Secondary | ICD-10-CM

## 2015-07-13 NOTE — Assessment & Plan Note (Signed)
Long discussion concerning her cholesterol Review of prior CT scans dating back for several years do not mention anything about coronary calcifications or premature CAD She is not particularly interested in taking a statin at this time

## 2015-07-13 NOTE — Assessment & Plan Note (Signed)
She feels well on her current dose of beta blocker, denies any symptoms No changes to her medications. We did discuss other beta blockers such as bystolic

## 2015-07-13 NOTE — Assessment & Plan Note (Signed)
Blood pressure is well controlled on today's visit. No changes made to the medications. 

## 2015-07-13 NOTE — Assessment & Plan Note (Signed)
History of prior surgical resection  She attributes the surgery to very Mild shortness of breath at times.

## 2015-07-13 NOTE — Patient Instructions (Signed)
You are doing well. No medication changes were made.  Please call us if you have new issues that need to be addressed before your next appt.  Your physician wants you to follow-up in: 12 months.  You will receive a reminder letter in the mail two months in advance. If you don't receive a letter, please call our office to schedule the follow-up appointment. 

## 2015-07-13 NOTE — Progress Notes (Signed)
Patient ID: Hannah Gonzalez, female    DOB: May 15, 1960, 55 y.o.   MRN: [id number]  HPI Comments: [name] is a 55 year old woman with long history of tachycardia, arrhythmia dating back to when she was young who presents for evaluation of her tachycardia. She presents today for follow-up of her tachycardia episodes 30 day monitor reviewed with her showing normal sinus rhythm, no significant arrhythmia, average heart rate 80-90 bpm  In follow-up today she reports that she is doing well overall, denies any significant arrhythmia events Active, no significant fatigue on her Toprol. She takes brand name drug not the generic  Lab work reviewed with her showing total cholesterol 230 Long discussion concerning her family history, atrial fibrillation, stroke  Some shortness of breath at times which she attributes to her chest surgery   EKG on today's visit shows normal sinus rhythm with rate 76 bpm, no significant ST or T-wave changes  Other past medical history Fatigue on higher dose metoprolol succinate . Tachycardia symptoms are at nighttime.  In the past she has tried propranolol Previously she reported having eye problems on propranolol  and her physician weaned her off.  15-16 years ago she woke up with general malaise one morning, while in the shower had syncope. Found to have very low blood pressure, erratic heart rate. Sent to the hospital and was noted to have tachycardia while in the hospital. She was offered ablation, started metoprolol 50 mg daily. 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 Faith Rogue  She reports her mother has history of A. fib, father with pacemaker, died of a stroke  Echocardiogram from October 2001 is essentially normal   No Known Allergies  Outpatient Encounter Prescriptions as of 07/13/2015   Medication Sig  . Cetirizine HCl 10 MG CAPS Take 10 mg by mouth daily.   . Cholecalciferol 1000 UNITS tablet Take 1,000 Units by mouth daily.   . Cranberry-Vitamin C-Vitamin E 420-100-3 MG-MG-UNIT CAPS Take 4,200 mg by mouth daily.   . Flaxseed MISC 1,400 mg daily. Use.  . Multiple Vitamins-Minerals (MENS 50+ MULTI VITAMIN/MIN PO) Take by mouth daily.  . Omega-3 Fatty Acids (FISH OIL) 1000 MG CAPS Take 1,200 mg by mouth daily.   Marland Kitchen omeprazole (PRILOSEC) 20 MG capsule Take 1 capsule by mouth  every morning  . tolterodine (DETROL LA) 4 MG 24 hr capsule Take 1 capsule by mouth  daily  . TOPROL XL 50 MG 24 hr tablet Take 1 tablet by mouth  daily  . [DISCONTINUED] amoxicillin-clavulanate (AUGMENTIN) 875-125 MG tablet Take 1 tablet by mouth 2 (two) times daily. (Patient not taking: Reported on 07/13/2015)  . [DISCONTINUED] doxycycline (VIBRA-TABS) 100 MG tablet Take 1 tablet (100 mg total) by mouth 2 (two) times daily. (Patient not taking: Reported on 07/13/2015)   No facility-administered encounter medications on file as of 07/13/2015.    Past Medical History  Diagnosis Date  . Vitamin D deficiency   . GERD (gastroesophageal reflux disease)   . GAD (generalized anxiety disorder)     Past Surgical History  Procedure Laterality Date  . Tonsillectomy    . Arthroscopic repair acl      left  . Myomectomy    . Parotidectomy    . Biopsy breast      left   . Thymectomy    .  Laparoscopy    . Anterior cruciate ligament repair    . Breast biopsy Left     benign    Social History  reports that she has never smoked. She has never used smokeless tobacco. She reports that she does not drink alcohol or use illicit drugs.  Family History family history includes Arrhythmia in her mother; Arthritis in her father and mother; Breast cancer in her cousin and maternal aunt; Cancer in her maternal aunt; Heart disease in her mother; Heart failure in her mother; Hyperlipidemia in her brother and mother;  Hypertension in her brother, father, and mother; Prostate cancer in her father.   Review of Systems  Constitutional: Negative.   Respiratory: Positive for shortness of breath.   Cardiovascular: Negative.   Gastrointestinal: Negative.   Musculoskeletal: Negative.   Skin: Negative.   Neurological: Negative.   Hematological: Negative.   Psychiatric/Behavioral: Negative.   All other systems reviewed and are negative.   BP 110/78 mmHg  Pulse 73  Ht 5\' 8"  (1.727 m)  Wt 167 lb (75.751 kg)  BMI 25.40 kg/m2  Physical Exam  Constitutional: She is oriented to person, place, and time. She appears well-developed and well-nourished.  HENT:  Head: Normocephalic.  Nose: Nose normal.  Mouth/Throat: Oropharynx is clear and moist.  Eyes: Conjunctivae are normal. Pupils are equal, round, and reactive to light.  Neck: Normal range of motion. Neck supple. No JVD present.  Cardiovascular: Normal rate, regular rhythm, S1 normal, S2 normal, normal heart sounds and intact distal pulses.  Exam reveals no gallop and no friction rub.   No murmur heard. Well-healed mediastinal scar  Pulmonary/Chest: Effort normal and breath sounds normal. No respiratory distress. She has no wheezes. She has no rales. She exhibits no tenderness.  Abdominal: Soft. Bowel sounds are normal. She exhibits no distension. There is no tenderness.  Musculoskeletal: Normal range of motion. She exhibits no edema or tenderness.  Lymphadenopathy:    She has no cervical adenopathy.  Neurological: She is alert and oriented to person, place, and time. Coordination normal.  Skin: Skin is warm and dry. No rash noted. No erythema.  Psychiatric: She has a normal mood and affect. Her behavior is normal. Judgment and thought content normal.    Assessment and Plan  Nursing note and vitals reviewed.

## 2015-09-01 ENCOUNTER — Ambulatory Visit: Payer: 59 | Admitting: Family Medicine

## 2015-09-03 ENCOUNTER — Other Ambulatory Visit: Payer: Self-pay | Admitting: Family Medicine

## 2015-09-09 ENCOUNTER — Ambulatory Visit: Payer: 59 | Admitting: Family Medicine

## 2015-09-21 ENCOUNTER — Ambulatory Visit (INDEPENDENT_AMBULATORY_CARE_PROVIDER_SITE_OTHER): Payer: 59 | Admitting: Family Medicine

## 2015-09-21 ENCOUNTER — Ambulatory Visit
Admission: RE | Admit: 2015-09-21 | Discharge: 2015-09-21 | Disposition: A | Discharge status: Home / Self Care | Payer: 59 | Source: Ambulatory Visit | Attending: Family Medicine | Admitting: Family Medicine

## 2015-09-21 VITALS — BP 124/82 | HR 82 | Temp 98.1°F | Resp 16 | Wt 171.0 lb

## 2015-09-21 DIAGNOSIS — E7800 Pure hypercholesterolemia, unspecified: Secondary | ICD-10-CM

## 2015-09-21 DIAGNOSIS — M25551 Pain in right hip: Secondary | ICD-10-CM | POA: Diagnosis not present

## 2015-09-21 DIAGNOSIS — R29898 Other symptoms and signs involving the musculoskeletal system: Secondary | ICD-10-CM

## 2015-09-21 DIAGNOSIS — M79642 Pain in left hand: Secondary | ICD-10-CM | POA: Diagnosis present

## 2015-09-21 DIAGNOSIS — Z23 Encounter for immunization: Secondary | ICD-10-CM | POA: Diagnosis not present

## 2015-09-21 DIAGNOSIS — K219 Gastro-esophageal reflux disease without esophagitis: Secondary | ICD-10-CM | POA: Diagnosis not present

## 2015-09-21 DIAGNOSIS — M6289 Other specified disorders of muscle: Secondary | ICD-10-CM | POA: Diagnosis present

## 2015-09-21 DIAGNOSIS — I10 Essential (primary) hypertension: Secondary | ICD-10-CM | POA: Diagnosis not present

## 2015-09-21 DIAGNOSIS — G8929 Other chronic pain: Secondary | ICD-10-CM | POA: Diagnosis not present

## 2015-09-21 DIAGNOSIS — M79641 Pain in right hand: Secondary | ICD-10-CM | POA: Diagnosis not present

## 2015-09-21 MED ORDER — NAPROXEN 500 MG PO TABS: 500.0000 mg | ORAL_TABLET | Freq: Two times a day (BID) | ORAL | 5 refills | Status: DC

## 2015-09-21 NOTE — Progress Notes (Signed)
Subjective:  HPI  Patient is here for 6 months follow up.  Hypertension: Patient has not been checking her b/p. No cardiac symptoms. BP Readings from Last 3 Encounters:  09/21/15 124/82  07/13/15 110/78  05/06/15 110/68   GERD: Patient does not have any issues with heartburn, she usually will take at the most 1 tablet twice a week if that.  Hip pain is still present, has click and cracks in the joints. She also developed bilateral hand pain since her last visit, knuckles are red and swollen, her grip is weaker and she drops things.  Prior to Admission medications   Medication Sig Start Date End Date Taking? Authorizing Provider  Cetirizine HCl 10 MG CAPS Take 10 mg by mouth daily.    Yes Historical Provider, MD  Cholecalciferol 1000 UNITS tablet Take 1,000 Units by mouth daily.    Yes Historical Provider, MD  Cranberry-Vitamin C-Vitamin E 420-100-3 MG-MG-UNIT CAPS Take 4,200 mg by mouth daily.    Yes Historical Provider, MD  Flaxseed MISC 1,400 mg daily. Use.   Yes Historical Provider, MD  Multiple Vitamins-Minerals (MENS 50+ MULTI VITAMIN/MIN PO) Take by mouth daily.   Yes Historical Provider, MD  Omega-3 Fatty Acids (FISH OIL) 1000 MG CAPS Take 1,200 mg by mouth daily.    Yes Historical Provider, MD  omeprazole (PRILOSEC) 20 MG capsule Take 1 capsule by mouth  every morning 03/30/15  Yes [name]., MD  tolterodine (DETROL LA) 4 MG 24 hr capsule Take 1 capsule by mouth  daily 01/26/15  Yes [name]., MD  TOPROL XL 50 MG 24 hr tablet Take 1 tablet by mouth  daily 09/03/15  Yes Johnnae Impastato Maceo Pro., MD    Patient Active Problem List   Diagnosis Date Noted  . Avitaminosis D 08/26/2014  . Anxiety, generalized 08/26/2014  . Acid reflux 08/26/2014  . Hypercholesteremia 08/26/2014  . BP (high blood pressure) 08/26/2014  . 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 Tachycardia 05/23/2014  .  Hyperlipidemia 05/23/2014  . 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 GAD (generalized anxiety disorder)   . GERD (gastroesophageal reflux disease)   . Vitamin D deficiency     Social History   Social History  . Marital status: Married    Spouse name: N/A  . Number of children: N/A  . Years of education: N/A   Occupational History  . Not on file.   Social History Main Topics  . Smoking status: Never Smoker  . Smokeless tobacco: Never Used  . Alcohol use No  . Drug use: No  . Sexual activity: Not on file   Other Topics Concern  . Not on file   Social History Narrative  . No narrative on file    No Known Allergies  Review of Systems  Constitutional: Negative.   Respiratory: Negative.   Cardiovascular: Negative.   Gastrointestinal: Negative.   Musculoskeletal: Positive for joint pain and myalgias.       Hand swelling, decreased grip strength.    Immunization History  Administered Date(s) Administered  . Influenza-Unspecified 12/03/2014  . Tdap 08/30/2005   Objective:  BP 124/82   Pulse 82   Temp 98.1 F (36.7 C)   Resp 16   Wt 171 lb (77.6 kg)   BMI 26.00 kg/m   Physical Exam  Constitutional: She is oriented to person, place, and  time and well-developed, well-nourished, and in no distress.  HENT:  Head: Normocephalic and atraumatic.  Right Ear: External ear normal.  Left Ear: External ear normal.  Nose: Nose normal.  Eyes: Conjunctivae are normal. Pupils are equal, round, and reactive to light.  Neck: Normal range of motion. Neck supple.  Cardiovascular: Normal rate, regular rhythm, normal heart sounds and intact distal pulses.   No murmur heard. Pulmonary/Chest: Effort normal and breath sounds normal. No respiratory distress. She has no wheezes.  Musculoskeletal: Normal range of motion. She exhibits no edema, tenderness or deformity.  Neurological: She is alert and oriented to person, place, and time.  Mild swelling  of the PIP joints of the fingers  Skin: Skin is warm and dry.  Psychiatric: Mood, memory, affect and judgment normal.    Lab Results  Component Value Date   WBC 4.6 03/10/2015   HGB 14.2 02/26/2014   HCT 39.3 03/10/2015   PLT 213 03/10/2015   GLUCOSE 96 03/10/2015   CHOL 230 (H) 03/10/2015   TRIG 99 03/10/2015   HDL 54 03/10/2015   LDLCALC 156 (H) 03/10/2015   TSH 3.170 03/10/2015    CMP     Component Value Date/Time   NA 142 03/10/2015 0854   K 4.6 03/10/2015 0854   CL 102 03/10/2015 0854   CO2 27 03/10/2015 0854   GLUCOSE 96 03/10/2015 0854   BUN 17 03/10/2015 0854   CREATININE 0.85 03/10/2015 0854   CALCIUM 9.1 03/10/2015 0854   PROT 6.2 03/10/2015 0854   ALBUMIN 4.2 03/10/2015 0854   AST 22 03/10/2015 0854   ALT 19 03/10/2015 0854   ALKPHOS 57 03/10/2015 0854   BILITOT 0.3 03/10/2015 0854   GFRNONAA 77 03/10/2015 0854   GFRAA 89 03/10/2015 0854    Assessment and Plan :  1. Hypercholesteremia  2. Essential hypertension Stable.  3. Gastroesophageal reflux disease, esophagitis presence not specified Advised patient she can try and decrease use of medication and then stop, follow.  4. Chronic hip pain, right  5. Bilateral hand pain New. I think this arthritis related. Try Naproxen for 2 weeks and take omeprazole daily with this medication. Will get xray-pending results.May need referral to rheumatology. - naproxen (NAPROSYN) 500 MG tablet; Take 1 tablet (500 mg total) by mouth 2 (two) times daily with a meal.  Dispense: 60 tablet; Refill: 5 - DG Hand Complete Left; Future - DG Hand Complete Right; Future  6. Need for TD vaccine Administered today. - Td : Tetanus/diphtheria >7yo Preservative  free  7. Reduced hand strength Pending xray results. - DG Hand Complete Left; Future - DG Hand Complete Right; Future  Patient was seen and examined by Dr. Eulas Post and note was scribed by Theressa Millard, RMA.   Miguel Aschoff MD [facility] 09/21/2015 3:11 PM

## 2015-09-22 ENCOUNTER — Telehealth: Payer: Self-pay | Admitting: Family Medicine

## 2015-09-22 DIAGNOSIS — M79641 Pain in right hand: Secondary | ICD-10-CM

## 2015-09-22 DIAGNOSIS — M79642 Pain in left hand: Principal | ICD-10-CM

## 2015-09-22 MED ORDER — NAPROXEN 500 MG PO TABS: 500.0000 mg | ORAL_TABLET | Freq: Two times a day (BID) | ORAL | 5 refills | Status: DC

## 2015-09-22 NOTE — Telephone Encounter (Signed)
Pt called saying her Rx for nyproxin 500mg  should have went to [facility] instead of her mail order.  CB [phone].  Baker Pierini

## 2015-09-22 NOTE — Telephone Encounter (Signed)
RX re sent, I thought it did go to [facility]. My mistake-aa

## 2015-09-23 ENCOUNTER — Telehealth: Payer: Self-pay

## 2015-09-23 NOTE — Telephone Encounter (Signed)
Pt advised of imaging results-aa

## 2015-10-29 ENCOUNTER — Other Ambulatory Visit: Payer: Self-pay

## 2015-10-29 MED ORDER — METOPROLOL SUCCINATE ER 50 MG PO TB24: 50.0000 mg | ORAL_TABLET | Freq: Every day | ORAL | 3 refills | Status: DC

## 2016-01-24 ENCOUNTER — Other Ambulatory Visit: Payer: Self-pay | Admitting: Family Medicine

## 2016-03-15 ENCOUNTER — Other Ambulatory Visit: Payer: Self-pay

## 2016-03-15 MED ORDER — TOLTERODINE TARTRATE ER 4 MG PO CP24: 4.0000 mg | ORAL_CAPSULE | Freq: Every day | ORAL | 3 refills | Status: DC

## 2016-03-15 NOTE — Telephone Encounter (Signed)
Refill request for Detrol LA-for urinary incontinence-generic-aa

## 2016-03-22 ENCOUNTER — Encounter: Payer: Self-pay | Admitting: Family Medicine

## 2016-03-22 ENCOUNTER — Ambulatory Visit (INDEPENDENT_AMBULATORY_CARE_PROVIDER_SITE_OTHER): Payer: 59 | Admitting: Family Medicine

## 2016-03-22 VITALS — BP 120/84 | HR 88 | Temp 98.6°F | Resp 16 | Ht 67.75 in | Wt 174.0 lb

## 2016-03-22 DIAGNOSIS — Z0000 Encounter for general adult medical examination without abnormal findings: Secondary | ICD-10-CM

## 2016-03-22 DIAGNOSIS — Z1231 Encounter for screening mammogram for malignant neoplasm of breast: Secondary | ICD-10-CM | POA: Diagnosis not present

## 2016-03-22 NOTE — Progress Notes (Signed)
Patient: Hannah Gonzalez, Female    DOB: 1960/12/22, 56 y.o.   MRN: [id number] Visit Date: 03/22/2016  Today's Provider: [name], MD   Chief Complaint  Patient presents with  . Annual Exam   Subjective:    Annual physical exam Hannah Gonzalez is a 56 y.o. female who presents today for health maintenance and complete physical. She feels well. She reports she is not exercising regularly. She reports she is sleeping well.  Last colonoscopy- 05/19/2015. Dr. Vira Agar. Repeat 5 years. Last mammo- 04/03/2015- negative Last pap with HPV- 02/19/2014- normal Pt had flu shot through work in October. -----------------------------------------------------------------   Review of Systems  Constitutional: Negative.   HENT: Negative.   Eyes: Negative.   Respiratory: Negative.   Cardiovascular: Negative.   Gastrointestinal: Negative.   Endocrine: Negative.   Genitourinary: Positive for urgency. Negative for decreased urine volume, difficulty urinating, dyspareunia, dysuria, enuresis, flank pain, frequency, genital sores, hematuria, menstrual problem, pelvic pain, vaginal bleeding, vaginal discharge and vaginal pain.  Musculoskeletal: Positive for arthralgias. Negative for back pain, gait problem, joint swelling, myalgias, neck pain and neck stiffness.  Skin: Negative.   Allergic/Immunologic: Negative.   Neurological: Negative.   Hematological: Negative.   Psychiatric/Behavioral: Negative.     Social History      She  reports that she has never smoked. She has never used smokeless tobacco. She reports that she does not drink alcohol or use drugs.       Social History   Social History  . Marital status: Married    Spouse name: Shanon Brow  . Number of children: 0  . Years of education: associates   Occupational History  .  Lab [employer]   Social History Main Topics  . Smoking status: Never Smoker  . Smokeless tobacco: Never Used  . Alcohol use No  . Drug use: No    . Sexual activity: Yes   Other Topics Concern  . Not on file   Social History Narrative  . No narrative on file    Past Medical History:  Diagnosis Date  . GAD (generalized anxiety disorder)   . GERD (gastroesophageal reflux disease)   . Vitamin D deficiency      Patient Active Problem List   Diagnosis Date Noted  . Avitaminosis D 08/26/2014  . Anxiety, generalized 08/26/2014  . Acid reflux 08/26/2014  . Hypercholesteremia 08/26/2014  . BP (high blood pressure) 08/26/2014  . 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 Tachycardia 05/23/2014  . Hyperlipidemia 05/23/2014  . 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 ANTERIOR CRUCIATE LIGAMENT REPAIR    . ARTHROSCOPIC REPAIR ACL     left  . BIOPSY BREAST     left   . BREAST BIOPSY Left    benign  . LAPAROSCOPY    . MYOMECTOMY    . PAROTIDECTOMY    . THYMECTOMY    . TONSILLECTOMY      Family History        Family Status  Relation Status  . Mother Alive  . Brother Alive  . Father Deceased   pacemaker implant  . Cousin Alive  . Maternal Aunt         Her family history includes Arrhythmia in her mother; Arthritis in her father and mother; Breast cancer in her cousin and maternal aunt; Cancer in her maternal aunt and mother; Heart  disease in her mother; Heart failure in her mother; Hyperlipidemia in her brother and mother; Hypertension in her brother, father, and mother; Prostate cancer in her father.     No Known Allergies   Current Outpatient Prescriptions:  .  Cetirizine HCl 10 MG CAPS, Take 10 mg by mouth daily. , Disp: , Rfl:  .  Cholecalciferol 1000 UNITS tablet, Take 1,000 Units by mouth daily. , Disp: , Rfl:  .  Cranberry-Vitamin C-Vitamin E 420-100-3 MG-MG-UNIT CAPS, Take 4,200 mg by mouth daily. , Disp: , Rfl:  .  Flaxseed MISC, 1,400 mg daily. Use., Disp: , Rfl:  .  metoprolol succinate (TOPROL XL) 50  MG 24 hr tablet, Take 1 tablet (50 mg total) by mouth daily. Take with or immediately following a meal., Disp: 90 tablet, Rfl: 3 .  Multiple Vitamins-Minerals (MENS 50+ MULTI VITAMIN/MIN PO), Take by mouth daily., Disp: , Rfl:  .  Omega-3 Fatty Acids (FISH OIL) 1000 MG CAPS, Take 1,200 mg by mouth daily. , Disp: , Rfl:  .  tolterodine (DETROL LA) 4 MG 24 hr capsule, Take 1 capsule (4 mg total) by mouth daily., Disp: 90 capsule, Rfl: 3   Patient Care Team: [name]., MD as PCP - General (Family Medicine) Minna Merritts, MD as Consulting Physician (Cardiology)      Objective:   Vitals: BP 120/84 (BP Location: Right Arm, Patient Position: Sitting, Cuff Size: Normal)   Pulse 88   Temp 98.6 F (37 C) (Oral)   Resp 16   Ht 5' 7.75" (1.721 m)   Wt 174 lb (78.9 kg)   BMI 26.65 kg/m    Physical Exam  Constitutional: She is oriented to person, place, and time. She appears well-developed and well-nourished.  HENT:  Head: Normocephalic and atraumatic.  Right Ear: Tympanic membrane, external ear and ear canal normal.  Left Ear: Tympanic membrane, external ear and ear canal normal.  Nose: Nose normal.  Mouth/Throat: Uvula is midline, oropharynx is clear and moist and mucous membranes are normal.  Eyes: Conjunctivae, EOM and lids are normal. Pupils are equal, round, and reactive to light.  Neck: Trachea normal and normal range of motion. Neck supple. Carotid bruit is not present. No thyroid mass and no thyromegaly present.  Cardiovascular: Normal rate, regular rhythm and normal heart sounds.   Pulmonary/Chest: Effort normal and breath sounds normal.  Abdominal: Soft. Normal appearance and bowel sounds are normal. There is no hepatosplenomegaly. There is no tenderness.  Genitourinary: No breast swelling, tenderness or discharge.  Musculoskeletal: Normal range of motion.  Lymphadenopathy:    She has no cervical adenopathy.    She has no axillary adenopathy.  Neurological: She is  alert and oriented to person, place, and time. She has normal strength. No cranial nerve deficit.  Skin: Skin is warm, dry and intact.  Psychiatric: She has a normal mood and affect. Her speech is normal and behavior is normal. Judgment and thought content normal. Cognition and memory are normal.     Depression Screen PHQ 2/9 Scores 03/22/2016 03/04/2015  PHQ - 2 Score 0 0      Assessment & Plan:     Routine Health Maintenance and Physical Exam  Exercise Activities and Dietary recommendations Goals    None      Immunization History  Administered Date(s) Administered  . Influenza Split 11/22/2015  . Influenza-Unspecified 12/03/2014  . Td 09/21/2015  . Tdap 08/30/2005    Health Maintenance  Topic Date Due  . Hepatitis  C Screening  12-Aug-1960  . HIV Screening  04/13/1974  . PAP SMEAR  04/13/1980  . INFLUENZA VACCINE  09/21/2016 (Originally 09/22/2015)  . MAMMOGRAM  04/02/2017  . COLONOSCOPY  05/06/2020  . TETANUS/TDAP  09/20/2025     Discussed health benefits of physical activity, and encouraged her to engage in regular exercise appropriate for her age and condition.    -------------------------------------------------------------------- 1. Annual physical exam Stable. Increase exercise. Consider swimming. Pap UTD; will check every 5 years since pt has no H/O abnormal paps. Immunizations UTD. Check labs as below. FU pending results. - CBC with Differential/Platelet - Comprehensive metabolic panel - TSH - Lipid panel  2. Encounter for screening mammogram for breast cancer Order mammogram. Pt will call [facility] to schedule this. - MM DIGITAL SCREENING BILATERAL; Future    Patient seen and examined by Miguel Aschoff, MD, and note scribed by Renaldo Fiddler, CMA.  I have done the exam and reviewed the above chart and it is accurate to the best of my knowledge. Development worker, community has been used in this note in any air is in the dictation or transcription are  unintentional.  [name], MD  [place]

## 2016-03-22 NOTE — Patient Instructions (Signed)
Call [facility] to schedule mammogram.

## 2016-04-06 ENCOUNTER — Telehealth: Payer: Self-pay

## 2016-04-06 LAB — CBC WITH DIFFERENTIAL/PLATELET
BASOS ABS: 0 10*3/uL (ref 0.0–0.2)
Basos: 0 %
EOS (ABSOLUTE): 0.1 10*3/uL (ref 0.0–0.4)
Eos: 2 %
HEMOGLOBIN: 14.1 g/dL (ref 11.1–15.9)
Hematocrit: 41.6 % (ref 34.0–46.6)
IMMATURE GRANULOCYTES: 0 %
Immature Grans (Abs): 0 10*3/uL (ref 0.0–0.1)
LYMPHS ABS: 2.9 10*3/uL (ref 0.7–3.1)
Lymphs: 48 %
MCH: 30.1 pg (ref 26.6–33.0)
MCHC: 33.9 g/dL (ref 31.5–35.7)
MCV: 89 fL (ref 79–97)
MONOCYTES: 7 %
MONOS ABS: 0.4 10*3/uL (ref 0.1–0.9)
NEUTROS PCT: 43 %
Neutrophils Absolute: 2.6 10*3/uL (ref 1.4–7.0)
Platelets: 242 10*3/uL (ref 150–379)
RBC: 4.68 x10E6/uL (ref 3.77–5.28)
RDW: 13.5 % (ref 12.3–15.4)
WBC: 6 10*3/uL (ref 3.4–10.8)

## 2016-04-06 LAB — COMPREHENSIVE METABOLIC PANEL
ALK PHOS: 58 IU/L (ref 39–117)
ALT: 20 IU/L (ref 0–32)
AST: 22 IU/L (ref 0–40)
Albumin/Globulin Ratio: 2.2 (ref 1.2–2.2)
Albumin: 4.4 g/dL (ref 3.5–5.5)
BUN/Creatinine Ratio: 18 (ref 9–23)
BUN: 15 mg/dL (ref 6–24)
Bilirubin Total: 0.4 mg/dL (ref 0.0–1.2)
CALCIUM: 9.4 mg/dL (ref 8.7–10.2)
CO2: 25 mmol/L (ref 18–29)
CREATININE: 0.84 mg/dL (ref 0.57–1.00)
Chloride: 102 mmol/L (ref 96–106)
GFR calc Af Amer: 90 mL/min/{1.73_m2} (ref >59)
GFR calc non Af Amer: 78 mL/min/{1.73_m2} (ref >59)
GLUCOSE: 92 mg/dL (ref 65–99)
Globulin, Total: 2 g/dL (ref 1.5–4.5)
Potassium: 4.6 mmol/L (ref 3.5–5.2)
Sodium: 143 mmol/L (ref 134–144)
Total Protein: 6.4 g/dL (ref 6.0–8.5)

## 2016-04-06 LAB — LIPID PANEL
CHOLESTEROL TOTAL: 235 mg/dL — AB (ref 100–199)
Chol/HDL Ratio: 4.4 ratio units (ref 0.0–4.4)
HDL: 53 mg/dL (ref >39)
LDL Calculated: 150 mg/dL — ABNORMAL HIGH (ref 0–99)
TRIGLYCERIDES: 160 mg/dL — AB (ref 0–149)
VLDL CHOLESTEROL CAL: 32 mg/dL (ref 5–40)

## 2016-04-06 LAB — TSH: TSH: 2.99 u[IU]/mL (ref 0.450–4.500)

## 2016-04-06 NOTE — Telephone Encounter (Signed)
Left message to call back  

## 2016-04-06 NOTE — Telephone Encounter (Signed)
-----   Message from [name]., MD sent at 04/06/2016  8:51 AM EST ----- Labs okay.

## 2016-04-07 NOTE — Telephone Encounter (Signed)
[id number] 

## 2016-04-08 NOTE — Telephone Encounter (Signed)
Pt informed and voiced understanding of results. 

## 2016-04-15 ENCOUNTER — Ambulatory Visit
Admission: RE | Admit: 2016-04-15 | Discharge: 2016-04-15 | Disposition: A | Discharge status: Home / Self Care | Payer: 59 | Source: Ambulatory Visit | Attending: Family Medicine | Admitting: Family Medicine

## 2016-04-15 DIAGNOSIS — Z1231 Encounter for screening mammogram for malignant neoplasm of breast: Secondary | ICD-10-CM | POA: Diagnosis not present

## 2016-05-12 ENCOUNTER — Other Ambulatory Visit: Payer: Self-pay

## 2016-05-12 MED ORDER — METOPROLOL SUCCINATE ER 50 MG PO TB24: 50.0000 mg | ORAL_TABLET | Freq: Every day | ORAL | 3 refills | Status: DC

## 2016-05-19 ENCOUNTER — Ambulatory Visit (INDEPENDENT_AMBULATORY_CARE_PROVIDER_SITE_OTHER): Payer: 59 | Admitting: Family Medicine

## 2016-05-19 ENCOUNTER — Encounter: Payer: Self-pay | Admitting: Family Medicine

## 2016-05-19 VITALS — BP 122/64 | Temp 98.2°F | Resp 16 | Wt 175.0 lb

## 2016-05-19 DIAGNOSIS — L539 Erythematous condition, unspecified: Secondary | ICD-10-CM | POA: Diagnosis not present

## 2016-05-19 DIAGNOSIS — W57XXXA Bitten or stung by nonvenomous insect and other nonvenomous arthropods, initial encounter: Secondary | ICD-10-CM

## 2016-05-19 MED ORDER — DOXYCYCLINE HYCLATE 100 MG PO TABS: 100.0000 mg | ORAL_TABLET | Freq: Two times a day (BID) | ORAL | 0 refills | Status: DC

## 2016-05-19 NOTE — Patient Instructions (Signed)
Gently cleanse with soap and water.  Call the office if you develop any headache, body aches, and fever.

## 2016-05-19 NOTE — Progress Notes (Signed)
       Patient: Hannah Gonzalez Female    DOB: 01-01-1961   56 y.o.   MRN: [id number] Visit Date: 05/19/2016  Today's Provider: [name], MD   Chief Complaint  Patient presents with  . Tick Removal   Subjective:    HPI  Patient comes in today c/o tick bite. She reports that she found a tick this morning attached to her skin. Patient reports that she is unaware how long the tick has been there. She comes in the office today asymptomatic.     No Known Allergies   Current Outpatient Prescriptions:  .  Cetirizine HCl 10 MG CAPS, Take 10 mg by mouth daily. , Disp: , Rfl:  .  Cholecalciferol 1000 UNITS tablet, Take 1,000 Units by mouth daily. , Disp: , Rfl:  .  Cranberry-Vitamin C-Vitamin E 420-100-3 MG-MG-UNIT CAPS, Take 4,200 mg by mouth daily. , Disp: , Rfl:  .  Flaxseed MISC, 1,400 mg daily. Use., Disp: , Rfl:  .  metoprolol succinate (TOPROL XL) 50 MG 24 hr tablet, Take 1 tablet (50 mg total) by mouth daily. Take with or immediately following a meal., Disp: 90 tablet, Rfl: 3 .  Multiple Vitamins-Minerals (MENS 50+ MULTI VITAMIN/MIN PO), Take by mouth daily., Disp: , Rfl:  .  Omega-3 Fatty Acids (FISH OIL) 1000 MG CAPS, Take 1,200 mg by mouth daily. , Disp: , Rfl:  .  tolterodine (DETROL LA) 4 MG 24 hr capsule, Take 1 capsule (4 mg total) by mouth daily., Disp: 90 capsule, Rfl: 3  Review of Systems  Constitutional: Negative.   HENT: Negative.   Respiratory: Negative.   Cardiovascular: Negative.   Endocrine: Negative.   Musculoskeletal: Negative.   Skin: Negative.   Allergic/Immunologic: Negative.   Neurological: Negative.   Psychiatric/Behavioral: Negative.     Social History  Substance Use Topics  . Smoking status: Never Smoker  . Smokeless tobacco: Never Used  . Alcohol use No   Objective:   BP 122/64 (BP Location: Right Arm, Patient Position: Sitting, Cuff Size: Normal)   Temp 98.2 F (36.8 C)   Resp 16   Wt 175 lb (79.4 kg)   BMI 26.81 kg/m    Vitals:   05/19/16 1345  BP: 122/64  Resp: 16  Temp: 98.2 F (36.8 C)  Weight: 175 lb (79.4 kg)     Physical Exam  Constitutional: She appears well-developed and well-nourished.  Skin: Skin is warm and dry. No rash noted. There is erythema. No pallor.  Psychiatric: She has a normal mood and affect. Her behavior is normal. Judgment and thought content normal.        Assessment & Plan:     1. Tick bite, initial encounter Cover for the possibility offuture  infection. Call if not improving.   - doxycycline (VIBRA-TABS) 100 MG tablet; Take 1 tablet (100 mg total) by mouth 2 (two) times daily.  Dispense: 20 tablet; Refill: 0       I have done the exam and reviewed the above chart and it is accurate to the best of my knowledge. Development worker, community has been used in this note in any air is in the dictation or transcription are unintentional.  [name], MD  [place]

## 2016-07-09 NOTE — Progress Notes (Signed)
Cardiology Office Note  Date:  07/12/2016   ID:  Hannah, Gonzalez [birth year], MRN [id number]  PCP:  [name]., MD   Chief Complaint  Patient presents with  . other    12 month follow up. Patient denies chest pain and SOB. Meds reviewd verbally with patient.     HPI:  Hannah Gonzalez is a 56 year old woman with history of  Tachycardia,  arrhythmia dating back to when she was young hyperlipidemia  who presents for evaluation of her tachycardia.  Major issue is back and hip pain "spinal stenosis" Had cortisone,  Better on prednisone for 3 days, better Now with pain down the leg  Denies any tachycardia on metoprolol  30 day monitor reviewed with her showing normal sinus rhythm, no significant arrhythmia, average heart rate 80-90 bpm  In follow-up today she reports that she is doing well overall, denies any significant arrhythmia events Active, no significant fatigue on her Toprol. She takes brand name drug not the generic  Previous labs reviewed personally by myself and with the patient on todays visit total cholesterol 235, LDL 150  EKG personally reviewed by myself on todays visit shows normal sinus rhythm with rate 84 bpm, no significant ST or T-wave changes  Other past medical history Fatigue on higher dose metoprolol succinate . Tachycardia symptoms are at nighttime.  In the past she has tried propranolol Previously she reported having eye problems on propranolol  and her physician weaned her off.  15-16 years ago she woke up with general malaise one morning, while in the shower had syncope. Found to have very low blood pressure, erratic heart rate. Sent to the hospital and was noted to have tachycardia while in the hospital. She was offered ablation, started metoprolol 50 mg daily. 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 Faith Rogue  She reports her mother has history of A. fib, father with pacemaker, died of a stroke  Echocardiogram from October 2001 is essentially normal   PMH:   has a past medical history of GAD (generalized anxiety disorder); GERD (gastroesophageal reflux disease); and Vitamin D deficiency.  PSH:    Past Surgical History:  Procedure Laterality Date  . ANTERIOR CRUCIATE LIGAMENT REPAIR    . ARTHROSCOPIC REPAIR ACL     left  . BIOPSY BREAST     left   . BREAST BIOPSY Left    benign  . LAPAROSCOPY    . MYOMECTOMY    . PAROTIDECTOMY    . THYMECTOMY    . TONSILLECTOMY      Current Outpatient Prescriptions  Medication Sig Dispense Refill  . Cetirizine HCl 10 MG CAPS Take 10 mg by mouth daily.     . Cholecalciferol 1000 UNITS tablet Take 1,000 Units by mouth daily.     . Cranberry-Vitamin C-Vitamin E 420-100-3 MG-MG-UNIT CAPS Take 4,200 mg by mouth daily.     . Flaxseed MISC 1,400 mg daily. Use.    . metoprolol succinate (TOPROL XL) 50 MG 24 hr tablet Take 1 tablet (50 mg total) by mouth daily. Take with or immediately following a meal. 90 tablet 3  . Multiple Vitamins-Minerals (MENS 50+ MULTI VITAMIN/MIN PO) Take by mouth daily.    . Omega-3 Fatty Acids (FISH OIL) 1000 MG CAPS Take 1,200 mg by mouth daily.     Marland Kitchen  tolterodine (DETROL LA) 4 MG 24 hr capsule Take 1 capsule (4 mg total) by mouth daily. 90 capsule 3   No current facility-administered medications for this visit.      Allergies:   Patient has no known allergies.   Social History:  The patient  reports that she has never smoked. She has never used smokeless tobacco. She reports that she does not drink alcohol or use drugs.   Family History:   family history includes Arrhythmia in her mother; Arthritis in her father and mother; Breast cancer in her cousin and maternal aunt; Cancer in her maternal aunt and mother; Heart disease in her mother; Heart failure in her mother;  Hyperlipidemia in her brother and mother; Hypertension in her brother, father, and mother; Prostate cancer in her father.    Review of Systems: Review of Systems  Constitutional: Negative.   Respiratory: Negative.   Cardiovascular: Negative.   Gastrointestinal: Negative.   Musculoskeletal: Positive for back pain.       Hip pain  Neurological: Negative.   Psychiatric/Behavioral: Negative.   All other systems reviewed and are negative.    PHYSICAL EXAM: VS:  BP 122/82 (BP Location: Left Arm, Patient Position: Sitting, Cuff Size: Normal)   Pulse 84   Ht 5\' 8"  (1.727 m)   Wt 174 lb 8 oz (79.2 kg)   BMI 26.53 kg/m  , BMI Body mass index is 26.53 kg/m. GEN: Well nourished, well developed, in no acute distress  HEENT: normal  Neck: no JVD, carotid bruits, or masses Cardiac: RRR; no murmurs, rubs, or gallops,no edema  Respiratory:  clear to auscultation bilaterally, normal work of breathing GI: soft, nontender, nondistended, + BS MS: no deformity or atrophy  Skin: warm and dry, no rash Neuro:  Strength and sensation are intact Psych: euthymic mood, full affect    Recent Labs: 04/05/2016: ALT 20; BUN 15; Creatinine, Ser 0.84; Platelets 242; Potassium 4.6; Sodium 143; TSH 2.990    Lipid Panel Lab Results  Component Value Date   CHOL 235 (H) 04/05/2016   HDL 53 04/05/2016   LDLCALC 150 (H) 04/05/2016   TRIG 160 (H) 04/05/2016      Wt Readings from Last 3 Encounters:  07/12/16 174 lb 8 oz (79.2 kg)  05/19/16 175 lb (79.4 kg)  03/22/16 174 lb (78.9 kg)       ASSESSMENT AND PLAN:  Tachycardia - Plan: EKG 12-Lead Well controlled on metoprolol  Mixed hyperlipidemia - Plan: EKG 12-Lead Elevated, discussed with her Suggested CT coronary calcium score  Essential hypertension - Plan: EKG 12-Lead Blood pressure is well controlled on today's visit. No changes made to the medications.  Hip and back pain Sees orthopedics, had shots, Significant symptoms   Total  encounter time more than 25 minutes  Greater than 50% was spent in counseling and coordination of care with the patient   Disposition:   F/U  12 months   Orders Placed This Encounter  Procedures  . EKG 12-Lead     Signed, Hannah Gonzalez, M.D., Ph.D. 07/12/2016  Gonzalez, Hannah

## 2016-07-12 ENCOUNTER — Ambulatory Visit (INDEPENDENT_AMBULATORY_CARE_PROVIDER_SITE_OTHER): Payer: 59 | Admitting: Cardiovascular Disease

## 2016-07-12 ENCOUNTER — Encounter: Payer: Self-pay | Admitting: Cardiovascular Disease

## 2016-07-12 VITALS — BP 122/82 | HR 84 | Ht 68.0 in | Wt 174.5 lb

## 2016-07-12 DIAGNOSIS — M546 Pain in thoracic spine: Secondary | ICD-10-CM | POA: Diagnosis not present

## 2016-07-12 DIAGNOSIS — M25552 Pain in left hip: Secondary | ICD-10-CM

## 2016-07-12 DIAGNOSIS — R000 Tachycardia, unspecified: Secondary | ICD-10-CM

## 2016-07-12 DIAGNOSIS — E782 Mixed hyperlipidemia: Secondary | ICD-10-CM | POA: Diagnosis not present

## 2016-07-12 DIAGNOSIS — I10 Essential (primary) hypertension: Secondary | ICD-10-CM

## 2016-07-12 DIAGNOSIS — G8929 Other chronic pain: Secondary | ICD-10-CM

## 2016-07-12 DIAGNOSIS — M25551 Pain in right hip: Secondary | ICD-10-CM

## 2016-07-12 NOTE — Patient Instructions (Signed)

## 2016-07-13 DIAGNOSIS — M5416 Radiculopathy, lumbar region: Secondary | ICD-10-CM | POA: Insufficient documentation

## 2016-07-15 ENCOUNTER — Other Ambulatory Visit: Payer: Self-pay | Admitting: Orthopedic Surgery

## 2016-07-15 DIAGNOSIS — M5416 Radiculopathy, lumbar region: Secondary | ICD-10-CM

## 2016-07-27 ENCOUNTER — Ambulatory Visit
Admission: RE | Admit: 2016-07-27 | Discharge: 2016-07-27 | Disposition: A | Discharge status: Home / Self Care | Payer: 59 | Source: Ambulatory Visit | Attending: Orthopedic Surgery | Admitting: Orthopedic Surgery

## 2016-07-27 DIAGNOSIS — M5416 Radiculopathy, lumbar region: Secondary | ICD-10-CM | POA: Diagnosis present

## 2016-07-27 DIAGNOSIS — M5116 Intervertebral disc disorders with radiculopathy, lumbar region: Secondary | ICD-10-CM | POA: Diagnosis not present

## 2016-09-15 ENCOUNTER — Ambulatory Visit (INDEPENDENT_AMBULATORY_CARE_PROVIDER_SITE_OTHER): Payer: 59 | Admitting: Family Medicine

## 2016-09-15 VITALS — BP 138/82 | HR 102 | Temp 98.4°F | Resp 14 | Wt 170.0 lb

## 2016-09-15 DIAGNOSIS — M5126 Other intervertebral disc displacement, lumbar region: Secondary | ICD-10-CM

## 2016-09-15 DIAGNOSIS — I10 Essential (primary) hypertension: Secondary | ICD-10-CM | POA: Diagnosis not present

## 2016-09-15 DIAGNOSIS — E7800 Pure hypercholesterolemia, unspecified: Secondary | ICD-10-CM

## 2016-09-15 DIAGNOSIS — M5416 Radiculopathy, lumbar region: Secondary | ICD-10-CM

## 2016-09-15 NOTE — Progress Notes (Signed)
Hannah Gonzalez  MRN: [id number] DOB: [birth year]  Subjective:  HPI  Patient is here for 6 months follow up. Last routine office visit was on 03/22/16 for CPE. Routine lab work was done at that time and it was ok. Patient is not checking her b/p at home. BP Readings from Last 3 Encounters:  09/15/16 138/82  07/12/16 122/82  05/19/16 122/64   She is not on any cholesterol medication. Lab Results  Component Value Date   CHOL 235 (H) 04/05/2016   HDL 53 04/05/2016   LDLCALC 150 (H) 04/05/2016   TRIG 160 (H) 04/05/2016   CHOLHDL 4.4 04/05/2016   Patient has been seen Dr Erma Heritage for ruptured disc and sciatica nerve since April 2018 and has surgery scheduled in August 2018. She did have epidural injections but only gave her relief for 2 days.  Patient Active Problem List   Diagnosis Date Noted  . Pain of both hip joints 07/12/2016  . Avitaminosis D 08/26/2014  . Anxiety, generalized 08/26/2014  . Acid reflux 08/26/2014  . Hypercholesteremia 08/26/2014  . Essential hypertension 08/26/2014  . 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 Tachycardia 05/23/2014  . Mixed hyperlipidemia 05/23/2014  . 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 GAD (generalized anxiety disorder)   . GERD (gastroesophageal reflux disease)   . Vitamin D deficiency     Social History   Social History  . Marital status: Married    Spouse name: Shanon Brow  . Number of children: 0  . Years of education: associates   Occupational History  .  Lab [employer]   Social History Main Topics  . Smoking status: Never Smoker  . Smokeless tobacco: Never Used  . Alcohol use No  . Drug use: No  . Sexual activity: Yes   Other Topics Concern  . Not on file   Social History Narrative  . No narrative on file    Outpatient Encounter Prescriptions as of 09/15/2016  Medication Sig Note  . Cetirizine HCl 10 MG CAPS Take 10 mg by  mouth daily.  05/23/2014: Received from: [place] [facility]  . Cholecalciferol 1000 UNITS tablet Take 1,000 Units by mouth daily.  05/23/2014: Received from: [place]  . Cranberry-Vitamin C-Vitamin E 420-100-3 MG-MG-UNIT CAPS Take 4,200 mg by mouth daily.  05/23/2014: Received from: [place]  . Flaxseed MISC 1,400 mg daily. Use. 05/23/2014: Received from: [place]  . ibuprofen (ADVIL,MOTRIN) 200 MG tablet Take 200 mg by mouth every 6 (six) hours as needed.   . metoprolol succinate (TOPROL XL) 50 MG 24 hr tablet Take 1 tablet (50 mg total) by mouth daily. Take with or immediately following a meal.   . Multiple Vitamins-Minerals (MENS 50+ MULTI VITAMIN/MIN PO) Take by mouth daily.   . Omega-3 Fatty Acids (FISH OIL) 1000 MG CAPS Take 1,200 mg by mouth daily.  05/23/2014: Received from: [place]  . tolterodine (DETROL LA) 4 MG 24 hr capsule Take 1 capsule (4 mg total) by mouth daily.    No facility-administered encounter medications on file as of 09/15/2016.     No Known Allergies  Review of Systems  Constitutional: Negative.   Eyes: Negative.   Respiratory: Negative.   Cardiovascular: Negative.   Gastrointestinal: Negative.   Musculoskeletal: Positive for back pain and joint pain.       Has ruptured disc and  sciatica nerve pain on the right per patient.  Skin: Negative.   Neurological:       Numbness on the right, in the right leg and down to the right foot, has decreased sensation in the right foot.  Endo/Heme/Allergies: Negative.   Psychiatric/Behavioral: Negative.     Objective:  BP 138/82   Pulse (!) 102   Temp 98.4 F (36.9 C)   Resp 14   Wt 170 lb (77.1 kg)   BMI 25.85 kg/m   Physical Exam  Assessment and Plan :  1. Essential hypertension Stable.  2. Displacement of lumbar intervertebral disc without myelopathy Following Dr. Sharyne Richters. Has surgery scheduled in August 2018. 3. Lumbar  radiculopathy  4. Hypercholesteremia  HPI, Exam and A&P transcribed by Theressa Millard, RMA under direction and in the presence of Miguel Aschoff, MD. I have done the exam and reviewed the chart and it is accurate to the best of my knowledge. Development worker, community has been used and  any errors in dictation or transcription are unintentional. Miguel Aschoff M.D. [facility]

## 2016-10-19 ENCOUNTER — Other Ambulatory Visit: Payer: Self-pay | Admitting: Family Medicine

## 2016-11-13 ENCOUNTER — Emergency Department
Admission: EM | Admit: 2016-11-13 | Discharge: 2016-11-14 | Disposition: A | Discharge status: Home / Self Care | Payer: 59 | Attending: Emergency Medicine | Admitting: Emergency Medicine

## 2016-11-13 DIAGNOSIS — K219 Gastro-esophageal reflux disease without esophagitis: Secondary | ICD-10-CM | POA: Diagnosis not present

## 2016-11-13 DIAGNOSIS — R197 Diarrhea, unspecified: Secondary | ICD-10-CM | POA: Diagnosis present

## 2016-11-13 DIAGNOSIS — I10 Essential (primary) hypertension: Secondary | ICD-10-CM | POA: Insufficient documentation

## 2016-11-13 DIAGNOSIS — Z79899 Other long term (current) drug therapy: Secondary | ICD-10-CM | POA: Diagnosis not present

## 2016-11-13 DIAGNOSIS — R112 Nausea with vomiting, unspecified: Secondary | ICD-10-CM

## 2016-11-13 HISTORY — DX: Palpitations: R00.2

## 2016-11-13 LAB — CBC
HEMATOCRIT: 42.7 % (ref 35.0–47.0)
Hemoglobin: 14.7 g/dL (ref 12.0–16.0)
MCH: 29.9 pg (ref 26.0–34.0)
MCHC: 34.6 g/dL (ref 32.0–36.0)
MCV: 86.6 fL (ref 80.0–100.0)
PLATELETS: 278 10*3/uL (ref 150–440)
RBC: 4.93 MIL/uL (ref 3.80–5.20)
RDW: 13.1 % (ref 11.5–14.5)
WBC: 7.6 10*3/uL (ref 3.6–11.0)

## 2016-11-13 LAB — COMPREHENSIVE METABOLIC PANEL
ALT: 25 U/L (ref 14–54)
AST: 33 U/L (ref 15–41)
Albumin: 4.4 g/dL (ref 3.5–5.0)
Alkaline Phosphatase: 47 U/L (ref 38–126)
Anion gap: 12 (ref 5–15)
BILIRUBIN TOTAL: 0.9 mg/dL (ref 0.3–1.2)
BUN: 11 mg/dL (ref 6–20)
CO2: 24 mmol/L (ref 22–32)
CREATININE: 0.77 mg/dL (ref 0.44–1.00)
Calcium: 9.6 mg/dL (ref 8.9–10.3)
Chloride: 101 mmol/L (ref 101–111)
Glucose, Bld: 136 mg/dL — ABNORMAL HIGH (ref 65–99)
POTASSIUM: 3.7 mmol/L (ref 3.5–5.1)
Sodium: 137 mmol/L (ref 135–145)
TOTAL PROTEIN: 7.5 g/dL (ref 6.5–8.1)

## 2016-11-13 LAB — LIPASE, BLOOD: Lipase: 30 U/L (ref 11–51)

## 2016-11-13 MED ORDER — ONDANSETRON 4 MG PO TBDP
4.0000 mg | ORAL_TABLET | Freq: Once | ORAL | Status: AC | PRN
  Administered 2016-11-13: 4 mg via ORAL
  Filled 2016-11-13: qty 1

## 2016-11-13 NOTE — ED Triage Notes (Signed)
Patient c/o multiple emeses and episodes of diarrhea beginning yesterday. Pt denies pain. Patient reports some form to stool.

## 2016-11-14 LAB — URINALYSIS, COMPLETE (UACMP) WITH MICROSCOPIC
BACTERIA UA: NONE SEEN
BILIRUBIN URINE: NEGATIVE
Glucose, UA: NEGATIVE mg/dL
Hgb urine dipstick: NEGATIVE
KETONES UR: 5 mg/dL — AB
LEUKOCYTES UA: NEGATIVE
Nitrite: NEGATIVE
PH: 7 (ref 5.0–8.0)
PROTEIN: NEGATIVE mg/dL
Specific Gravity, Urine: 1.016 (ref 1.005–1.030)

## 2016-11-14 MED ORDER — ONDANSETRON HCL 4 MG/2ML IJ SOLN
4.0000 mg | Freq: Once | INTRAMUSCULAR | Status: AC
  Administered 2016-11-14: 4 mg via INTRAVENOUS
  Filled 2016-11-14: qty 2

## 2016-11-14 MED ORDER — ONDANSETRON 4 MG PO TBDP: 4.0000 mg | ORAL_TABLET | Freq: Three times a day (TID) | ORAL | 0 refills | Status: DC | PRN

## 2016-11-14 MED ORDER — SODIUM CHLORIDE 0.9 % IV BOLUS (SEPSIS)
1000.0000 mL | Freq: Once | INTRAVENOUS | Status: AC
  Administered 2016-11-14: 1000 mL via INTRAVENOUS

## 2016-11-14 NOTE — ED Provider Notes (Signed)
[facility] Emergency Department Provider Note   ____________________________________________   First MD Initiated Contact with Patient 11/14/16 0022     (approximate)  I have reviewed the triage vital signs and the nursing notes.   HISTORY  Chief Complaint Emesis and Diarrhea    HPI Hannah Gonzalez is a 56 y.o. female who presents to the ED from home with a chief complaint of nausea, vomiting and diarrhea.symptoms started yesterday about lunch time. Reports vomiting about 7 times and diarrhea about 4 times. Symptoms not associated with abdominal pain. Patient does report recent travel from [facility] to home, with multiple connections in multiple airports. Denies associated fever, chills, chest pain, shortness of breath, dysuria. ODT Zofran given in triage without relief of symptoms.   Past Medical History:  Diagnosis Date  . GAD (generalized anxiety disorder)   . GERD (gastroesophageal reflux disease)   . Palpitations   . Vitamin D deficiency     Patient Active Problem List   Diagnosis Date Noted  . Lumbar radiculopathy 07/13/2016  . Pain of both hip joints 07/12/2016  . Avitaminosis D 08/26/2014  . Anxiety, generalized 08/26/2014  . Acid reflux 08/26/2014  . Hypercholesteremia 08/26/2014  . Essential hypertension 08/26/2014  . 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 Tachycardia 05/23/2014  . Mixed hyperlipidemia 05/23/2014  . 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 ANTERIOR CRUCIATE LIGAMENT REPAIR    . ARTHROSCOPIC REPAIR ACL     left  . BIOPSY BREAST     left   . BREAST BIOPSY Left    benign  . LAPAROSCOPY    . MYOMECTOMY    . PAROTIDECTOMY    . THYMECTOMY    . TONSILLECTOMY      Prior to Admission medications   Medication Sig Start Date End Date Taking? Authorizing Provider  Cetirizine HCl 10 MG CAPS Take 10 mg by  mouth daily.     [provider]  Cholecalciferol 1000 UNITS tablet Take 1,000 Units by mouth daily.     [provider]  Cranberry-Vitamin C-Vitamin E 420-100-3 MG-MG-UNIT CAPS Take 4,200 mg by mouth daily.     [provider]  Flaxseed MISC 1,400 mg daily. Use.    [provider]  ibuprofen (ADVIL,MOTRIN) 200 MG tablet Take 200 mg by mouth every 6 (six) hours as needed.    [provider]  metoprolol succinate (TOPROL XL) 50 MG 24 hr tablet Take 1 tablet (50 mg total) by mouth daily. Take with or immediately following a meal. 05/12/16   [name]., MD  Multiple Vitamins-Minerals (MENS 50+ MULTI VITAMIN/MIN PO) Take by mouth daily.    [provider]  Omega-3 Fatty Acids (FISH OIL) 1000 MG CAPS Take 1,200 mg by mouth daily.     [provider]  tolterodine (DETROL LA) 4 MG 24 hr capsule Take 1 capsule (4 mg total) by mouth daily. 03/15/16   [name]., MD    Allergies Patient has no known allergies.  Family History  Problem Relation Age of Onset  . Hypertension Mother   . Hyperlipidemia Mother   . Heart disease Mother   . Heart failure Mother   . Arrhythmia Mother        A-Fib  . Arthritis Mother   . Cancer Mother   . Hypertension Brother   . Hyperlipidemia Brother   .  Arthritis Father   . Hypertension Father   . Prostate cancer Father   . Breast cancer Cousin   . Cancer Maternal Aunt   . Breast cancer Maternal Aunt     Social History Social History  Substance Use Topics  . Smoking status: Never Smoker  . Smokeless tobacco: Never Used  . Alcohol use No    Review of Systems  Constitutional: No fever/chills. Eyes: No visual changes. ENT: No sore throat. Cardiovascular: Denies chest pain. Respiratory: Denies shortness of breath. Gastrointestinal: No abdominal pain.  positive for nausea, vomiting and diarrhea.  No constipation. Genitourinary: Negative for dysuria. Musculoskeletal:  Negative for back pain. Skin: Negative for rash. Neurological: Negative for headaches, focal weakness or numbness.   ____________________________________________   PHYSICAL EXAM:  VITAL SIGNS: ED Triage Vitals  Enc Vitals Group     BP 11/13/16 2301 118/84     Pulse Rate 11/13/16 2301 85     Resp 11/13/16 2301 17     Temp 11/13/16 2301 (!) 96.6 F (35.9 C)     Temp Source 11/13/16 2301 Axillary     SpO2 11/13/16 2301 100 %     Weight 11/13/16 2303 170 lb (77.1 kg)     Height --      Head Circumference --      Peak Flow --      Pain Score --      Pain Loc --      Pain Edu? --      Excl. in [place]? --     Constitutional: Alert and oriented. Tired appearing and in no acute distress. Eyes: Conjunctivae are normal. PERRL. EOMI. Head: Atraumatic. Nose: No congestion/rhinnorhea. Mouth/Throat: Mucous membranes are mildly dry.  Oropharynx non-erythematous. Neck: No stridor.   Cardiovascular: Normal rate, regular rhythm. Grossly normal heart sounds.  Good peripheral circulation. Respiratory: Normal respiratory effort.  No retractions. Lungs CTAB. Gastrointestinal: Soft and nontender to light or deep palpation. No distention. No abdominal bruits. No CVA tenderness. Musculoskeletal: No lower extremity tenderness nor edema.  No joint effusions. Neurologic:  Normal speech and language. No gross focal neurologic deficits are appreciated. No gait instability. Skin:  Skin is warm, dry and intact. No rash noted. Psychiatric: Mood and affect are normal. Speech and behavior are normal.  ____________________________________________   LABS (all labs ordered are listed, but only abnormal results are displayed)  Labs Reviewed  COMPREHENSIVE METABOLIC PANEL - Abnormal; Notable for the following:       Result Value   Glucose, Bld 136 (*)    All other components within normal limits  URINALYSIS, COMPLETE (UACMP) WITH MICROSCOPIC - Abnormal; Notable for the following:    Color, Urine YELLOW (*)     APPearance HAZY (*)    Ketones, ur 5 (*)    Squamous Epithelial / LPF 0-5 (*)    All other components within normal limits  C DIFFICILE QUICK SCREEN W PCR REFLEX  GASTROINTESTINAL PANEL BY PCR, STOOL (REPLACES STOOL CULTURE)  LIPASE, BLOOD  CBC   ____________________________________________  EKG  none ____________________________________________  RADIOLOGY  No results found.  ____________________________________________   PROCEDURES  Procedure(s) performed: None  Procedures  Critical Care performed: No  ____________________________________________   INITIAL IMPRESSION / ASSESSMENT AND PLAN / ED COURSE  Pertinent labs & imaging results that were available during my care of the patient were reviewed by me and considered in my medical decision making (see chart for details).  56 year old female who presents with nausea, vomiting and diarrhea in the  setting of recent travel. Differential diagnosis includes but is not limited to gastroenteritis, colitis, diverticulitis, food borne illness.laboratory results noted and are unremarkable. Will initiate IV fluid resuscitation, IV antiemetic, stool cultures, urinalysis and reassess.  Clinical Course as of Nov 14 349  Mon Nov 14, 2016  0250 Patient is feeling much better. Resting comfortably in no acute distress. Will try ice chips.  [JS]  0320 Patient tolerated ice chips without emesis. Feeling much better and eager for discharge home. Will discharge home with Zofran and patient will follow up closely with her PCP. Strict return precautions given. Patient verbalizes understanding and agrees with plan of care.  [JS]    Clinical Course User Index [JS] Paulette Blanch, MD     ____________________________________________   FINAL CLINICAL IMPRESSION(S) / ED DIAGNOSES  Final diagnoses:  Nausea vomiting and diarrhea      NEW MEDICATIONS STARTED DURING THIS VISIT:  New Prescriptions   No medications on file      Note:  This document was prepared using Dragon voice recognition software and may include unintentional dictation errors.    Paulette Blanch, MD 11/14/16 [phone]

## 2016-11-14 NOTE — ED Notes (Signed)
Pt reports she developed n/v/d on Saturday around lunch time. Pt reports vomited about 7 times and stool that has been soft to liquid about 4 times. Pt denies abd pain. Pt does report she flew home from [place] on Friday. Pt denies fever.

## 2016-11-14 NOTE — Discharge Instructions (Signed)
1. You may take Zofran as needed for nausea. 2. Clear liquids 12 hours, then slowly advance diet as tolerated. BRAT diet 3 days, then slowly advance diet as tolerated. 3. Return to the ER for worsening symptoms, persistent vomiting, difficulty breathing or other concerns.

## 2016-11-16 ENCOUNTER — Ambulatory Visit (INDEPENDENT_AMBULATORY_CARE_PROVIDER_SITE_OTHER): Payer: 59 | Admitting: Family Medicine

## 2016-11-16 VITALS — BP 122/80 | HR 96 | Temp 98.8°F | Resp 16 | Wt 168.0 lb

## 2016-11-16 DIAGNOSIS — M5416 Radiculopathy, lumbar region: Secondary | ICD-10-CM

## 2016-11-16 DIAGNOSIS — K529 Noninfective gastroenteritis and colitis, unspecified: Secondary | ICD-10-CM | POA: Diagnosis not present

## 2016-11-16 NOTE — Patient Instructions (Signed)
Continue to push fluids and follow the BRAT diet.

## 2016-11-16 NOTE — Progress Notes (Signed)
TYTEANNA OST  MRN: [id number] DOB: 06-20-1960  Subjective:  HPI   The patient is a 56 year old female who presents for follow after being seen in the emergency room on 11/13/16 for nausea and diarrhea.  At the time she had reported to the ER team that she had vomited 7 times and had diarrhea 4 times in about a 12 hour period.  She denied abdominal pain at the time.  She had reported recent travel from [facility] but on international travel.   Vital signs, urine and labs in the ER were relatively normal.  She was given fluids and Zofran with improvement and sent home with Zofran and instructions to contact PCP or return to ED if symptoms returned or worsened.   The patient reports today that she had continued to have diarrhea after leaving the hospital until yesterday, no vomiting.  She also reports that yesterday she ran a low grade fever off 99.6 and had chills.  Yesterday was the first day she was able to eat something and not have it run through her.  She had rice last night and today along with some crackers and toast today.  She has not had a bowel movement of any kind today.   Patient Active Problem List   Diagnosis Date Noted  . Lumbar radiculopathy 07/13/2016  . Pain of both hip joints 07/12/2016  . Avitaminosis D 08/26/2014  . Anxiety, generalized 08/26/2014  . Acid reflux 08/26/2014  . Hypercholesteremia 08/26/2014  . Essential hypertension 08/26/2014  . 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 Tachycardia 05/23/2014  . Mixed hyperlipidemia 05/23/2014  . 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 GAD (generalized anxiety disorder)   . GERD (gastroesophageal reflux disease)   . Palpitations   . Vitamin D deficiency     Social History   Social History  . Marital status: Married    Spouse name: Shanon Brow  . Number of children: 0  . Years of education: associates   Occupational  History  .  [employer]   Social History Main Topics  . Smoking status: Never Smoker  . Smokeless tobacco: Never Used  . Alcohol use No  . Drug use: No  . Sexual activity: Yes   Other Topics Concern  . Not on file   Social History Narrative  . No narrative on file    Outpatient Encounter Prescriptions as of 11/16/2016  Medication Sig Note  . Cetirizine HCl 10 MG CAPS Take 10 mg by mouth daily.  05/23/2014: Received from: [place]  . Cholecalciferol 1000 UNITS tablet Take 1,000 Units by mouth daily.  05/23/2014: Received from: [place]  . Cranberry-Vitamin C-Vitamin E 420-100-3 MG-MG-UNIT CAPS Take 4,200 mg by mouth daily.  05/23/2014: Received from: [place]  . Flaxseed MISC 1,400 mg daily. Use. 05/23/2014: Received from: [place]  . ibuprofen (ADVIL,MOTRIN) 200 MG tablet Take 200 mg by mouth every 6 (six) hours as needed.   . metoprolol succinate (TOPROL XL) 50 MG 24 hr tablet Take 1 tablet (50 mg total) by mouth daily. Take with or immediately following a meal.   . Multiple Vitamins-Minerals (MENS 50+ MULTI VITAMIN/MIN PO) Take by mouth daily.   . Omega-3 Fatty Acids (FISH OIL) 1000 MG CAPS Take 1,200 mg by mouth daily.  05/23/2014: Received from: [place]  . ondansetron (  ZOFRAN ODT) 4 MG disintegrating tablet Take 1 tablet (4 mg total) by mouth every 8 (eight) hours as needed for nausea or vomiting.   . tolterodine (DETROL LA) 4 MG 24 hr capsule Take 1 capsule (4 mg total) by mouth daily.    No facility-administered encounter medications on file as of 11/16/2016.     No Known Allergies  Review of Systems  Constitutional: Positive for chills, diaphoresis, fever and malaise/fatigue.  Respiratory: Negative.   Cardiovascular: Negative.   Gastrointestinal: Positive for nausea. Negative for abdominal pain, blood in stool, constipation, diarrhea, heartburn, melena and vomiting.       As of  today patient is not having any GI symptoms except for the nausea.  Neurological: Positive for weakness.  Endo/Heme/Allergies: Negative.   Psychiatric/Behavioral: Negative.     Objective:  BP 122/80 (BP Location: Right Arm, Patient Position: Sitting, Cuff Size: Normal)   Pulse 96   Temp 98.8 F (37.1 C) (Oral)   Resp 16   Wt 168 lb (76.2 kg)   BMI 25.54 kg/m   Physical Exam  Constitutional: She is well-developed, well-nourished, and in no distress.  HENT:  Head: Normocephalic and atraumatic.  Eyes: Pupils are equal, round, and reactive to light. Conjunctivae are normal.  Neck: Normal range of motion.  Cardiovascular: Normal rate, regular rhythm and normal heart sounds.   Pulmonary/Chest: Effort normal and breath sounds normal.  Abdominal: Soft. Bowel sounds are normal. There is tenderness (mild tenderness throughtout).    Assessment and Plan :    Viral gastroenteritis  slowly resolving.  Encouraged fluids and slowly advancing diet.  recent lumbar laminectomy  I have done the exam and reviewed the chart and it is accurate to the best of my knowledge. Development worker, community has been used and  any errors in dictation or transcription are unintentional. Miguel Aschoff M.D. [facility]

## 2016-11-22 ENCOUNTER — Telehealth: Payer: Self-pay

## 2016-11-22 MED ORDER — METOPROLOL SUCCINATE ER 50 MG PO TB24: 50.0000 mg | ORAL_TABLET | Freq: Every day | ORAL | 3 refills | Status: DC

## 2016-11-22 NOTE — Telephone Encounter (Signed)
Done-Kiaraliz Rafuse V Tonjua Rossetti, RMA  

## 2016-11-22 NOTE — Telephone Encounter (Signed)
Patient called office request a 90day supply for Metoprolol Succinate 50mg  be sent to [facility]. KW

## 2017-02-11 ENCOUNTER — Other Ambulatory Visit: Payer: Self-pay | Admitting: Family Medicine

## 2017-03-23 ENCOUNTER — Encounter: Payer: Self-pay | Admitting: Family Medicine

## 2017-03-23 ENCOUNTER — Ambulatory Visit (INDEPENDENT_AMBULATORY_CARE_PROVIDER_SITE_OTHER): Payer: Managed Care, Other (non HMO) | Admitting: Family Medicine

## 2017-03-23 ENCOUNTER — Other Ambulatory Visit: Payer: Self-pay

## 2017-03-23 VITALS — BP 110/72 | HR 82 | Temp 98.5°F | Resp 12 | Ht 67.75 in | Wt 173.0 lb

## 2017-03-23 DIAGNOSIS — I10 Essential (primary) hypertension: Secondary | ICD-10-CM | POA: Diagnosis not present

## 2017-03-23 DIAGNOSIS — Z1231 Encounter for screening mammogram for malignant neoplasm of breast: Secondary | ICD-10-CM

## 2017-03-23 DIAGNOSIS — N941 Unspecified dyspareunia: Secondary | ICD-10-CM | POA: Diagnosis not present

## 2017-03-23 DIAGNOSIS — Z0000 Encounter for general adult medical examination without abnormal findings: Secondary | ICD-10-CM | POA: Diagnosis not present

## 2017-03-23 DIAGNOSIS — Z1211 Encounter for screening for malignant neoplasm of colon: Secondary | ICD-10-CM

## 2017-03-23 DIAGNOSIS — M25551 Pain in right hip: Secondary | ICD-10-CM | POA: Diagnosis not present

## 2017-03-23 DIAGNOSIS — G8929 Other chronic pain: Secondary | ICD-10-CM | POA: Diagnosis not present

## 2017-03-23 DIAGNOSIS — G5791 Unspecified mononeuropathy of right lower limb: Secondary | ICD-10-CM | POA: Diagnosis not present

## 2017-03-23 LAB — IFOBT (OCCULT BLOOD): IMMUNOLOGICAL FECAL OCCULT BLOOD TEST: NEGATIVE

## 2017-03-23 NOTE — Progress Notes (Signed)
Patient: Hannah Gonzalez, Female    DOB: Sep 26, 1960, 57 y.o.   MRN: [id number] Visit Date: 03/23/2017  Today's Provider: [name], MD   Chief Complaint  Patient presents with  . Annual Exam   Subjective:  Hannah Gonzalez is a 57 y.o. female who presents today for health maintenance and complete physical. She feels well. She reports exercising some walking. She reports she is sleeping well.  Last colonoscopy was 05/19/2015 Dr Elliott-record requested. Pap smear 02/19/2014 normal. Per last CPE note in 2018 decided to repeat pap smear every 5 years. No history of abnormal pap smears, no hysterectomy in the past done. Mammogram 04/15/2016 normal BMD 02/02/2012 normal.  Review of Systems  Constitutional: Negative.   HENT: Negative.   Eyes: Negative.   Respiratory: Negative.   Cardiovascular: Negative.   Gastrointestinal: Negative.   Endocrine: Negative.   Genitourinary: Positive for urgency.  Musculoskeletal: Positive for arthralgias.  Skin: Negative.   Allergic/Immunologic: Negative.   Neurological: Negative.   Hematological: Negative.   Psychiatric/Behavioral: Negative.     Social History   Socioeconomic History  . Marital status: Married    Spouse name: Shanon Brow  . Number of children: 0  . Years of education: associates  . Highest education level: Not on file  Social Needs  . Financial resource strain: Not on file  . Food insecurity - worry: Not on file  . Food insecurity - inability: Not on file  . Transportation needs - medical: Not on file  . Transportation needs - non-medical: Not on file  Occupational History    Employer: [employer]  Tobacco Use  . Smoking status: Never Smoker  . Smokeless tobacco: Never Used  Substance and Sexual Activity  . Alcohol use: No  . Drug use: No  . Sexual activity: Yes  Other Topics Concern  . Not on file  Social History Narrative  . Not on file    Patient Active Problem List   Diagnosis Date Noted  . Lumbar  radiculopathy 07/13/2016  . Pain of both hip joints 07/12/2016  . Avitaminosis D 08/26/2014  . Anxiety, generalized 08/26/2014  . Acid reflux 08/26/2014  . Hypercholesteremia 08/26/2014  . Essential hypertension 08/26/2014  . 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 Tachycardia 05/23/2014  . Mixed hyperlipidemia 05/23/2014  . 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 ANTERIOR CRUCIATE LIGAMENT REPAIR    . ARTHROSCOPIC REPAIR ACL     left  . BIOPSY BREAST     left   . BREAST BIOPSY Left    benign  . LAPAROSCOPY    . MYOMECTOMY    . PAROTIDECTOMY    . THYMECTOMY    . TONSILLECTOMY      Her family history includes Arrhythmia in her mother; Arthritis in her father and mother; Breast cancer in her cousin and maternal aunt; Cancer in her maternal aunt and mother; Heart disease in her mother; Heart failure in her mother; Hyperlipidemia in her brother and mother; Hypertension in her brother, father, and mother; Prostate cancer in her father.     Outpatient Encounter Medications as of 03/23/2017  Medication Sig Note  . Cetirizine HCl 10 MG CAPS Take 10 mg by mouth daily.  05/23/2014: Received from: [place]  . Cholecalciferol 1000 UNITS tablet Take 1,000 Units by mouth daily.  05/23/2014: Received from: [place]  . Cranberry-Vitamin C-Vitamin E  420-100-3 MG-MG-UNIT CAPS Take 4,200 mg by mouth daily.  05/23/2014: Received from: [place]  . Flaxseed MISC 1,400 mg daily. Use. 05/23/2014: Received from: [employer]  . ibuprofen (ADVIL,MOTRIN) 200 MG tablet Take 200 mg by mouth every 6 (six) hours as needed.   . metoprolol succinate (TOPROL XL) 50 MG 24 hr tablet Take 1 tablet (50 mg total) by mouth daily. Take with or immediately following a meal.   . Multiple Vitamins-Minerals (MENS 50+ MULTI VITAMIN/MIN PO) Take by mouth  daily.   . Omega-3 Fatty Acids (FISH OIL) 1000 MG CAPS Take 1,200 mg by mouth daily.  05/23/2014: Received from: [place]  . tolterodine (DETROL LA) 4 MG 24 hr capsule TAKE 1 CAPSULE BY MOUTH  DAILY   . TURMERIC PO Take by mouth daily.   . [DISCONTINUED] ondansetron (ZOFRAN ODT) 4 MG disintegrating tablet Take 1 tablet (4 mg total) by mouth every 8 (eight) hours as needed for nausea or vomiting.    No facility-administered encounter medications on file as of 03/23/2017.     Patient Care Team: [name]., MD as PCP - General (Family Medicine) Minna Merritts, MD as Consulting Physician (Cardiology)      Objective:   Vitals:  Vitals:   03/23/17 0849  BP: 110/72  Pulse: 82  Resp: 12  Temp: 98.5 F (36.9 C)  Weight: 173 lb (78.5 kg)  Height: 5' 7.75" (1.721 m)    Physical Exam  Constitutional: She is oriented to person, place, and time. She appears well-developed and well-nourished.  HENT:  Head: Normocephalic and atraumatic.  Right Ear: External ear normal.  Left Ear: External ear normal.  Nose: Nose normal.  Mouth/Throat: Oropharynx is clear and moist.  Eyes: Conjunctivae are normal. No scleral icterus.  Neck: No thyromegaly present.  Cardiovascular: Normal rate, regular rhythm, normal heart sounds and intact distal pulses.  Pulmonary/Chest: Effort normal and breath sounds normal.  Abdominal: Soft.  Genitourinary: Vagina normal. Rectal exam shows guaiac negative stool. No vaginal discharge found.  Genitourinary Comments: Attempted to perform pap smear, patient was in significant pain. Defer at this time. Bimanual was done. See A&P   Lymphadenopathy:    She has no cervical adenopathy.  Neurological: She is alert and oriented to person, place, and time.  Skin: Skin is warm and dry.  Fair skin.  Psychiatric: She has a normal mood and affect. Her behavior is normal. Judgment and thought content normal.   Depression Screen PHQ 2/9 Scores  03/22/2016 03/04/2015  PHQ - 2 Score 0 0    Assessment & Plan:   1. Annual physical exam - CBC with Differential/Platelet - Comprehensive metabolic panel - TSH - Lipid Panel With LDL/HDL Ratio  2. Encounter for screening mammogram for breast cancer - MM SCREENING BREAST TOMO BILATERAL  3. Essential hypertension 4. Chronic hip pain, right  5. Colon cancer screening - IFOBT POC (occult bld, rslt in office)  6. Neuropathy of right foot  7. Dyspareunia, female Per patient this has been an issue since menopause. Patient was in significant pain/discomfort when attempting to do pap smear today which is new. Bimanual exam was done and was normal today. Sexual intercourse is very painful for patient also. Discussed this in detail. Refer to Dr Glennon Mac for further work up. - Ambulatory referral to Gynecology  HPI, Exam and A&P transcribed by Tiffany Kocher, RMA under direction and in the presence of Miguel Aschoff, MD. I have done the  exam and reviewed the chart and it is accurate to the best of my knowledge. Development worker, community has been used and  any errors in dictation or transcription are unintentional. Miguel Aschoff M.D. [facility]

## 2017-04-07 ENCOUNTER — Encounter: Payer: Self-pay | Admitting: Obstetrics and Gynecology

## 2017-04-07 ENCOUNTER — Ambulatory Visit: Payer: Managed Care, Other (non HMO) | Admitting: Obstetrics and Gynecology

## 2017-04-07 VITALS — BP 128/82 | HR 99 | Ht 67.75 in | Wt 171.0 lb

## 2017-04-07 DIAGNOSIS — N941 Unspecified dyspareunia: Secondary | ICD-10-CM | POA: Diagnosis not present

## 2017-04-07 MED ORDER — OSPEMIFENE 60 MG PO TABS: 1.0000 | ORAL_TABLET | Freq: Every day | ORAL | 11 refills | Status: DC

## 2017-04-07 NOTE — Progress Notes (Signed)
.   Patient ID: Hannah Gonzalez, female   DOB: [birth year], 57 y.o.   MRN: [id number]  Reason for Consult: Referral (dyspareunia)   Referred by [name].,*  Subjective:     HPI:  Hannah Gonzalez is a 57 y.o. female she presents today for dyspareunia. She has not been sexually active for over a year. She had back surgery and was recovering so there was a long hiatus of sexual activity. Recently she was at her primary care providers office and she had significant pain on speculum exam. Her provider was not able to obtain a pap smear. She denies any vulvar itching or burning. Denies any abnormal discharge or malodorous discharge.  Past Medical History:  Diagnosis Date  . GAD (generalized anxiety disorder)   . GERD (gastroesophageal reflux disease)   . Palpitations   . Vitamin D deficiency    Family History  Problem Relation Age of Onset  . Hypertension Mother   . Hyperlipidemia Mother   . Heart disease Mother   . Heart failure Mother   . Arrhythmia Mother        A-Fib  . Arthritis Mother   . Cancer Mother   . Hypertension Brother   . Hyperlipidemia Brother   . Arthritis Father   . Hypertension Father   . Prostate cancer Father   . Breast cancer Cousin   . Cancer Maternal Aunt   . Breast cancer Maternal Aunt    Past Surgical History:  Procedure Laterality Date  . ANTERIOR CRUCIATE LIGAMENT REPAIR    . ARTHROSCOPIC REPAIR ACL     left  . BIOPSY BREAST     left   . BREAST BIOPSY Left    benign  . LAPAROSCOPY    . MYOMECTOMY    . PAROTIDECTOMY    . THYMECTOMY    . TONSILLECTOMY      Short Social History:  Social History   Tobacco Use  . Smoking status: Never Smoker  . Smokeless tobacco: Never Used  Substance Use Topics  . Alcohol use: No    No Known Allergies  Current Outpatient Medications  Medication Sig Dispense Refill  . Cetirizine HCl 10 MG CAPS Take 10 mg by mouth daily.     . Cholecalciferol 1000 UNITS tablet Take 1,000 Units  by mouth daily.     . Cranberry-Vitamin C-Vitamin E 420-100-3 MG-MG-UNIT CAPS Take 4,200 mg by mouth daily.     . Flaxseed MISC 1,400 mg daily. Use.    . ibuprofen (ADVIL,MOTRIN) 200 MG tablet Take 200 mg by mouth every 6 (six) hours as needed.    . metoprolol succinate (TOPROL XL) 50 MG 24 hr tablet Take 1 tablet (50 mg total) by mouth daily. Take with or immediately following a meal. 90 tablet 3  . Multiple Vitamins-Minerals (MENS 50+ MULTI VITAMIN/MIN PO) Take by mouth daily.    . Omega-3 Fatty Acids (FISH OIL) 1000 MG CAPS Take 1,200 mg by mouth daily.     Marland Kitchen tolterodine (DETROL LA) 4 MG 24 hr capsule TAKE 1 CAPSULE BY MOUTH  DAILY 90 capsule 3  . TURMERIC PO Take by mouth daily.    . Ospemifene (OSPHENA) 60 MG TABS Take 1 tablet by mouth daily. 30 tablet 11   No current facility-administered medications for this visit.     Review of Systems  Constitutional: Negative for chills, fatigue, fever and unexpected weight change.  HENT: Negative for trouble swallowing.  Eyes: Negative for loss of vision.  Respiratory: Negative for cough, shortness of breath and wheezing.  Cardiovascular: Negative for chest pain, leg swelling, palpitations and syncope.  GI: Negative for abdominal pain, blood in stool, diarrhea, nausea and vomiting.  GU: Negative for difficulty urinating, dysuria, frequency and hematuria.  Musculoskeletal: Negative for back pain, leg pain and joint pain.  Skin: Negative for rash.  Neurological: Negative for dizziness, headaches, light-headedness, numbness and seizures.  Psychiatric: Negative for behavioral problem, confusion, depressed mood and sleep disturbance.        Objective:  Objective   Vitals:   04/07/17 0829  BP: 128/82  Pulse: 99  Weight: 171 lb (77.6 kg)  Height: 5' 7.75" (1.721 m)   Body mass index is 26.19 kg/m.  Physical Exam  Constitutional: She is oriented to person, place, and time. She appears well-developed and well-nourished.  HENT:  Head:  Normocephalic and atraumatic.  Eyes: EOM are normal.  Cardiovascular: Normal rate, regular rhythm and normal heart sounds.  Pulmonary/Chest: Effort normal and breath sounds normal.  Genitourinary:  Genitourinary Comments: Significantly narrowed introitus.  Perineal skin appears normal, no discoloration, no lesions, no erythema, no induration.  Neurological: She is alert and oriented to person, place, and time.  Skin: Skin is warm and dry.  Psychiatric: She has a normal mood and affect. Her behavior is normal. Judgment and thought content normal.  Nursing note and vitals reviewed.       Assessment/Plan:    57 yo with dyspareunia- Discussed options with patient of oral medication (osphena),  vaginal creams (premarin, intrarosa etc.), Monalisa touch procedure and pelvic floor physical therapy.  Patient elected for Osphena medication. She will consider physical therapy in the future, but not now.    Follow up in 3-4 months to evaluate treatment efficacy.    Homero Fellers MD  [facility] 04/09/17 2:56 PM

## 2017-04-09 ENCOUNTER — Encounter: Payer: Self-pay | Admitting: Obstetrics and Gynecology

## 2017-04-17 ENCOUNTER — Ambulatory Visit
Admission: RE | Admit: 2017-04-17 | Discharge: 2017-04-17 | Disposition: A | Discharge status: Home / Self Care | Payer: Managed Care, Other (non HMO) | Source: Ambulatory Visit | Attending: Family Medicine | Admitting: Family Medicine

## 2017-04-17 DIAGNOSIS — Z1231 Encounter for screening mammogram for malignant neoplasm of breast: Secondary | ICD-10-CM | POA: Diagnosis not present

## 2017-05-11 ENCOUNTER — Telehealth: Payer: Self-pay

## 2017-05-11 LAB — COMPREHENSIVE METABOLIC PANEL
A/G RATIO: 2 (ref 1.2–2.2)
ALK PHOS: 53 IU/L (ref 39–117)
ALT: 18 IU/L (ref 0–32)
AST: 24 IU/L (ref 0–40)
Albumin: 4.4 g/dL (ref 3.5–5.5)
BUN / CREAT RATIO: 18 (ref 9–23)
BUN: 15 mg/dL (ref 6–24)
Bilirubin Total: 0.3 mg/dL (ref 0.0–1.2)
CALCIUM: 8.9 mg/dL (ref 8.7–10.2)
CO2: 23 mmol/L (ref 20–29)
Chloride: 103 mmol/L (ref 96–106)
Creatinine, Ser: 0.84 mg/dL (ref 0.57–1.00)
GFR calc Af Amer: 89 mL/min/{1.73_m2} (ref >59)
GFR, EST NON AFRICAN AMERICAN: 77 mL/min/{1.73_m2} (ref >59)
GLOBULIN, TOTAL: 2.2 g/dL (ref 1.5–4.5)
Glucose: 97 mg/dL (ref 65–99)
POTASSIUM: 4.3 mmol/L (ref 3.5–5.2)
SODIUM: 143 mmol/L (ref 134–144)
Total Protein: 6.6 g/dL (ref 6.0–8.5)

## 2017-05-11 LAB — LIPID PANEL WITH LDL/HDL RATIO
CHOLESTEROL TOTAL: 232 mg/dL — AB (ref 100–199)
HDL: 50 mg/dL (ref >39)
LDL CALC: 156 mg/dL — AB (ref 0–99)
LDl/HDL Ratio: 3.1 ratio (ref 0.0–3.2)
Triglycerides: 129 mg/dL (ref 0–149)
VLDL Cholesterol Cal: 26 mg/dL (ref 5–40)

## 2017-05-11 LAB — CBC WITH DIFFERENTIAL/PLATELET
BASOS: 0 %
Basophils Absolute: 0 10*3/uL (ref 0.0–0.2)
EOS (ABSOLUTE): 0.1 10*3/uL (ref 0.0–0.4)
EOS: 2 %
HEMATOCRIT: 42.2 % (ref 34.0–46.6)
Hemoglobin: 13.4 g/dL (ref 11.1–15.9)
Immature Grans (Abs): 0 10*3/uL (ref 0.0–0.1)
Immature Granulocytes: 0 %
LYMPHS ABS: 2.3 10*3/uL (ref 0.7–3.1)
Lymphs: 46 %
MCH: 29.1 pg (ref 26.6–33.0)
MCHC: 31.8 g/dL (ref 31.5–35.7)
MCV: 92 fL (ref 79–97)
MONOS ABS: 0.4 10*3/uL (ref 0.1–0.9)
Monocytes: 9 %
Neutrophils Absolute: 2.2 10*3/uL (ref 1.4–7.0)
Neutrophils: 43 %
Platelets: 254 10*3/uL (ref 150–379)
RBC: 4.61 x10E6/uL (ref 3.77–5.28)
RDW: 13.8 % (ref 12.3–15.4)
WBC: 5.1 10*3/uL (ref 3.4–10.8)

## 2017-05-11 LAB — TSH: TSH: 3.39 u[IU]/mL (ref 0.450–4.500)

## 2017-05-11 NOTE — Telephone Encounter (Signed)
-----   Message from [name]., MD sent at 05/11/2017 10:28 AM EDT ----- Stable.

## 2017-05-11 NOTE — Telephone Encounter (Signed)
[id number] 

## 2017-05-12 NOTE — Telephone Encounter (Signed)
Pt advised.

## 2017-06-02 ENCOUNTER — Telehealth: Payer: Self-pay | Admitting: Cardiovascular Disease

## 2017-06-02 NOTE — Telephone Encounter (Signed)
° °  [facility] Pre-operative Risk Assessment    Request for surgical clearance:  1. What type of surgery is being performed? Right Total interior hip replacement  2. When is this surgery scheduled? 07/24/17   3. What type of clearance is required (medical clearance vs. Pharmacy clearance to hold med vs. Both)? Not listed   4. Are there any medications that need to be held prior to surgery and how long? Not listed   5. Practice name and name of physician performing surgery? [facility] Dr Kurtis Bushman   6. What is your office phone number  [phone]   7.   What is your office fax number [phone]   8.   Anesthesia type (None, local, MAC, general) General

## 2017-06-02 NOTE — Telephone Encounter (Signed)
Spoke with patient regarding clearance request and she denies any problems or changes since her last office visit. She is scheduled to come in for follow up and confirmed her appointment. Will route clearance to provider for his review.

## 2017-06-05 NOTE — Telephone Encounter (Signed)
Acceptable risk for procedure No further testing needed 

## 2017-06-06 ENCOUNTER — Telehealth: Payer: Self-pay | Admitting: Emergency Medicine

## 2017-06-06 NOTE — Telephone Encounter (Signed)
Tried to call pt to schedule surgical clearance. LMTCB. Her surgery is on 07/24/17. Needs to be no more than 30 days prior to surgery. Form in [facility] box.

## 2017-06-06 NOTE — Telephone Encounter (Signed)
Clearance routed to number provided via Epic fax.  

## 2017-06-08 NOTE — Telephone Encounter (Signed)
Scheduled 07/20/17

## 2017-06-20 NOTE — Progress Notes (Signed)
Cardiology Office Note  Date:  06/21/2017   ID:  Hannah Gonzalez, DOB 1960/12/08, MRN [id number]  PCP:  [name]., MD   Chief Complaint  Patient presents with  . Other    12 month follow up. Patient denies chest pain and SOB at this time. Patient needs clearance for a procedure. Meds reviewed verbally with patient.     HPI:  Hannah Gonzalez is a 57 year old woman with history of  Tachycardia,  arrhythmia dating back to when she was young hyperlipidemia  who presents for evaluation of her tachycardia.  Major issue is back and hip pain Had back surgery Now back better  Now hip pain Previously with shot was doing well, now hip is back starting 04/2017 Needs THR ("buckling")  Denies any tachycardia on metoprolol Heart is "ok" Dr.Bowen at [facility]  EKG personally reviewed by myself on todays visit Shows normal sinus rhythm with rate 90 bpm no significant ST or T wave changes   Other past medical history reviewed 30 day monitor reviewed with her showing normal sinus rhythm, no significant arrhythmia, average heart rate 80-90 bpm  Previous labs reviewed personally by myself and with the patient on todays visit total cholesterol 235, LDL 150  Fatigue on higher dose metoprolol succinate . Tachycardia symptoms are at nighttime.  In the past she has tried propranolol Previously she reported having eye problems on propranolol  and her physician weaned her off.  15-16 years ago she woke up with general malaise one morning, while in the shower had syncope. Found to have very low blood pressure, erratic heart rate. Sent to the hospital and was noted to have tachycardia while in the hospital. She was offered ablation, started metoprolol 50 mg daily. 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 Faith Rogue  She  reports her mother has history of A. fib, father with pacemaker, died of a stroke  Echocardiogram from October 2001 is essentially normal   PMH:   has a past medical history of GAD (generalized anxiety disorder), GERD (gastroesophageal reflux disease), Palpitations, and Vitamin D deficiency.  PSH:    Past Surgical History:  Procedure Laterality Date  . ANTERIOR CRUCIATE LIGAMENT REPAIR    . ARTHROSCOPIC REPAIR ACL     left  . BIOPSY BREAST     left   . BREAST BIOPSY Left    benign  . LAPAROSCOPY    . MYOMECTOMY    . PAROTIDECTOMY    . THYMECTOMY    . TONSILLECTOMY      Current Outpatient Medications  Medication Sig Dispense Refill  . Cetirizine HCl 10 MG CAPS Take 10 mg by mouth daily.     . Cholecalciferol 1000 UNITS tablet Take 1,000 Units by mouth daily.     . Cranberry-Vitamin C-Vitamin E 420-100-3 MG-MG-UNIT CAPS Take 4,200 mg by mouth daily.     . Flaxseed MISC 1,400 mg daily. Use.    . ibuprofen (ADVIL,MOTRIN) 200 MG tablet Take 200 mg by mouth every 6 (six) hours as needed.    . metoprolol succinate (TOPROL XL) 50 MG 24 hr tablet Take 1 tablet (50 mg total) by mouth daily. Take with or immediately following a meal. 90 tablet 3  . Multiple Vitamins-Minerals (MENS 50+ MULTI VITAMIN/MIN PO) Take by mouth daily.    . Omega-3 Fatty  Acids (FISH OIL) 1000 MG CAPS Take 1,200 mg by mouth daily.     . Ospemifene (OSPHENA) 60 MG TABS Take 1 tablet by mouth daily. 30 tablet 11  . tolterodine (DETROL LA) 4 MG 24 hr capsule TAKE 1 CAPSULE BY MOUTH  DAILY 90 capsule 3  . TURMERIC PO Take by mouth daily.     No current facility-administered medications for this visit.      Allergies:   Patient has no known allergies.   Social History:  The patient  reports that she has never smoked. She has never used smokeless tobacco. She reports that she does not drink alcohol or use drugs.   Family History:   family history includes Arrhythmia in her mother; Arthritis in her father and  mother; Breast cancer in her cousin and maternal aunt; Cancer in her maternal aunt and mother; Heart disease in her mother; Heart failure in her mother; Hyperlipidemia in her brother and mother; Hypertension in her brother, father, and mother; Prostate cancer in her father.    Review of Systems: Review of Systems  Constitutional: Negative.   Respiratory: Negative.   Cardiovascular: Negative.   Gastrointestinal: Negative.   Musculoskeletal: Positive for joint pain.       Hip pain  Neurological: Negative.   Psychiatric/Behavioral: Negative.   All other systems reviewed and are negative.    PHYSICAL EXAM: VS:  BP 128/74 (BP Location: Left Arm, Patient Position: Sitting, Cuff Size: Normal)   Pulse 90   Ht 5\' 8"  (1.727 m)   Wt 173 lb (78.5 kg)   BMI 26.30 kg/m  , BMI Body mass index is 26.3 kg/m. Constitutional:  oriented to person, place, and time. No distress.  HENT:  Head: Normocephalic and atraumatic.  Eyes:  no discharge. No scleral icterus.  Neck: Normal range of motion. Neck supple. No JVD present.  Cardiovascular: Normal rate, regular rhythm, normal heart sounds and intact distal pulses. Exam reveals no gallop and no friction rub. No edema No murmur heard. Pulmonary/Chest: Effort normal and breath sounds normal. No stridor. No respiratory distress.  no wheezes.  no rales.  no tenderness.  Abdominal: Soft.  no distension.  no tenderness.  Musculoskeletal: Normal range of motion.  no  tenderness or deformity.  Neurological:  normal muscle tone. Coordination normal. No atrophy Skin: Skin is warm and dry. No rash noted. not diaphoretic.  Psychiatric:  normal mood and affect. behavior is normal. Thought content normal.   Recent Labs: 05/10/2017: ALT 18; BUN 15; Creatinine, Ser 0.84; Hemoglobin 13.4; Platelets 254; Potassium 4.3; Sodium 143; TSH 3.390    Lipid Panel Lab Results  Component Value Date   CHOL 232 (H) 05/10/2017   HDL 50 05/10/2017   LDLCALC 156 (H) 05/10/2017    TRIG 129 05/10/2017      Wt Readings from Last 3 Encounters:  06/21/17 173 lb (78.5 kg)  04/07/17 171 lb (77.6 kg)  03/23/17 173 lb (78.5 kg)       ASSESSMENT AND PLAN:  Tachycardia - Plan: EKG 12-Lead Well controlled on metoprolol No further work-up needed, no medication changes made  Mixed hyperlipidemia - Plan: EKG 12-Lead Previous CT scan reviewed in detail, images discussed There is no coronary calcification, no aortic atherosclerosis No strong indication for aggressive treatment of her cholesterol  Essential hypertension - Plan: EKG 12-Lead Blood pressure is well controlled on today's visit. No changes made to the medications. Stable Hip and back pain Followed by orthopedics, scheduled to have total hip replacement  Acceptable risk for procedure, no further testing needed  Long discussion concerning her cardiac risk factors, CT scan images reviewed in detail in the office  Total encounter time more than 45 minutes  Greater than 50% was spent in counseling and coordination of care with the patient   Disposition:   F/U  12 months   Orders Placed This Encounter  Procedures  . EKG 12-Lead     Signed, Esmond Plants, M.D., Ph.D. 06/21/2017  [place], [place]

## 2017-06-21 ENCOUNTER — Ambulatory Visit: Payer: Managed Care, Other (non HMO) | Admitting: Cardiovascular Disease

## 2017-06-21 ENCOUNTER — Encounter: Payer: Self-pay | Admitting: Cardiovascular Disease

## 2017-06-21 VITALS — BP 128/74 | HR 90 | Ht 68.0 in | Wt 173.0 lb

## 2017-06-21 DIAGNOSIS — M546 Pain in thoracic spine: Secondary | ICD-10-CM | POA: Diagnosis not present

## 2017-06-21 DIAGNOSIS — I479 Paroxysmal tachycardia, unspecified: Secondary | ICD-10-CM | POA: Diagnosis not present

## 2017-06-21 DIAGNOSIS — I10 Essential (primary) hypertension: Secondary | ICD-10-CM

## 2017-06-21 DIAGNOSIS — E782 Mixed hyperlipidemia: Secondary | ICD-10-CM | POA: Diagnosis not present

## 2017-06-21 DIAGNOSIS — M25551 Pain in right hip: Secondary | ICD-10-CM

## 2017-06-21 DIAGNOSIS — G8929 Other chronic pain: Secondary | ICD-10-CM | POA: Insufficient documentation

## 2017-06-21 NOTE — Patient Instructions (Signed)

## 2017-06-30 ENCOUNTER — Ambulatory Visit: Payer: Self-pay | Admitting: Orthopedic Surgery

## 2017-07-12 ENCOUNTER — Other Ambulatory Visit: Payer: Self-pay

## 2017-07-12 ENCOUNTER — Encounter
Admission: RE | Admit: 2017-07-12 | Discharge: 2017-07-12 | Disposition: A | Discharge status: Home / Self Care | Payer: Managed Care, Other (non HMO) | Source: Ambulatory Visit | Attending: Orthopedic Surgery | Admitting: Orthopedic Surgery

## 2017-07-12 DIAGNOSIS — Z01812 Encounter for preprocedural laboratory examination: Secondary | ICD-10-CM | POA: Insufficient documentation

## 2017-07-12 HISTORY — DX: Basal cell carcinoma of skin, unspecified: C44.91

## 2017-07-12 LAB — URINALYSIS, ROUTINE W REFLEX MICROSCOPIC
Bilirubin Urine: NEGATIVE
GLUCOSE, UA: NEGATIVE mg/dL
Hgb urine dipstick: NEGATIVE
Ketones, ur: NEGATIVE mg/dL
LEUKOCYTES UA: NEGATIVE
Nitrite: NEGATIVE
PROTEIN: NEGATIVE mg/dL
SPECIFIC GRAVITY, URINE: 1.014 (ref 1.005–1.030)
pH: 5 (ref 5.0–8.0)

## 2017-07-12 LAB — TYPE AND SCREEN
ABO/RH(D): O POS
ANTIBODY SCREEN: NEGATIVE

## 2017-07-12 LAB — CBC
HEMATOCRIT: 38.5 % (ref 35.0–47.0)
Hemoglobin: 13.4 g/dL (ref 12.0–16.0)
MCH: 30.9 pg (ref 26.0–34.0)
MCHC: 34.7 g/dL (ref 32.0–36.0)
MCV: 89 fL (ref 80.0–100.0)
Platelets: 244 10*3/uL (ref 150–440)
RBC: 4.32 MIL/uL (ref 3.80–5.20)
RDW: 13.2 % (ref 11.5–14.5)
WBC: 5.2 10*3/uL (ref 3.6–11.0)

## 2017-07-12 LAB — PROTIME-INR
INR: 0.93
Prothrombin Time: 12.4 seconds (ref 11.4–15.2)

## 2017-07-12 LAB — BASIC METABOLIC PANEL
ANION GAP: 6 (ref 5–15)
BUN: 17 mg/dL (ref 6–20)
CHLORIDE: 109 mmol/L (ref 101–111)
CO2: 27 mmol/L (ref 22–32)
Calcium: 9.1 mg/dL (ref 8.9–10.3)
Creatinine, Ser: 0.62 mg/dL (ref 0.44–1.00)
Glucose, Bld: 99 mg/dL (ref 65–99)
POTASSIUM: 4 mmol/L (ref 3.5–5.1)
SODIUM: 142 mmol/L (ref 135–145)

## 2017-07-12 LAB — SURGICAL PCR SCREEN
MRSA, PCR: NEGATIVE
Staphylococcus aureus: NEGATIVE

## 2017-07-12 LAB — APTT: APTT: 26 s (ref 24–36)

## 2017-07-12 NOTE — Patient Instructions (Signed)
Your procedure is scheduled on: Monday 07/24/17 Report to [place]. To find out your arrival time please call [phone] between 1PM - 3PM on Friday 07/21/17 .  Remember: Instructions that are not followed completely may result in serious medical risk, up to and including death, or upon the discretion of your surgeon and anesthesiologist your surgery may need to be rescheduled.     _X__ 1. Do not eat food after midnight the night before your procedure.                 No gum chewing or hard candies. You may drink clear liquids up to 2 hours                 before you are scheduled to arrive for your surgery- DO not drink clear                 liquids within 2 hours of the start of your surgery.                 Clear Liquids include:  water, apple juice without pulp, clear carbohydrate                 drink such as Clearfast or Gatorade, Black Coffee or Tea (Do not add                 anything to coffee or tea).  __X__2.  On the morning of surgery brush your teeth with toothpaste and water, you                 may rinse your mouth with mouthwash if you wish.  Do not swallow any              toothpaste of mouthwash.     _X__ 3.  No Alcohol for 24 hours before or after surgery.   _X__ 4.  Do Not Smoke or use e-cigarettes For 24 Hours Prior to Your Surgery.                 Do not use any chewable tobacco products for at least 6 hours prior to                 surgery.  ____  5.  Bring all medications with you on the day of surgery if instructed.   __X__  6.  Notify your doctor if there is any change in your medical condition      (cold, fever, infections).     Do not wear jewelry, make-up, hairpins, clips or nail polish. Do not wear lotions, powders, or perfumes.  Do not shave 48 hours prior to surgery. Men may shave face and neck. Do not bring valuables to the hospital.    [facility] is not responsible for any belongings or  valuables.  Contacts, dentures/partials or body piercings may not be worn into surgery. Bring a case for your contacts, glasses or hearing aids, a denture cup will be supplied. Leave your suitcase in the car. After surgery it may be brought to your room. For patients admitted to the hospital, discharge time is determined by your treatment team.   Patients discharged the day of surgery will not be allowed to drive home.   Please read over the following fact sheets that you were given:   MRSA Information  __X__ Take these medicines the morning of surgery with A SIP OF WATER:  1. metoprolol  2. cetirizine  3. tolterodine  4.  5.  6.  ____ Fleet Enema (as directed)   __X__ Use CHG Soap/SAGE wipes as directed  ____ Use inhalers on the day of surgery  ____ Stop metformin/Janumet/Farxiga 2 days prior to surgery    ____ Take 1/2 of usual insulin dose the night before surgery. No insulin the morning          of surgery.   ____ Stop Blood Thinners Coumadin/Plavix/Xarelto/Pleta/Pradaxa/Eliquis/Effient/Aspirin  on   Or contact your Surgeon, Cardiologist or Medical Doctor regarding  ability to stop your blood thinners  __X__ Stop Anti-inflammatories 7 days before surgery such as Advil, Ibuprofen, Motrin,  BC or Goodies Powder, Naprosyn, Naproxen, Aleve, Aspirin You may use Tylenol or Acetaminophen   __X__ Stop all herbal and other  Supplements including fish oil or vitamin E until after surgery.  You may continue your multivitamin and D3  ____ Bring C-Pap to the hospital.

## 2017-07-12 NOTE — Pre-Procedure Instructions (Signed)
Patient cleared by cardiologist Dr Rockey Situ on 06/21/17

## 2017-07-19 ENCOUNTER — Encounter: Payer: Self-pay | Admitting: Obstetrics and Gynecology

## 2017-07-19 ENCOUNTER — Ambulatory Visit (INDEPENDENT_AMBULATORY_CARE_PROVIDER_SITE_OTHER): Payer: Managed Care, Other (non HMO) | Admitting: Obstetrics and Gynecology

## 2017-07-19 VITALS — BP 124/72 | HR 106 | Ht 67.75 in | Wt 170.0 lb

## 2017-07-19 DIAGNOSIS — N951 Menopausal and female climacteric states: Secondary | ICD-10-CM

## 2017-07-19 DIAGNOSIS — N941 Unspecified dyspareunia: Secondary | ICD-10-CM

## 2017-07-19 NOTE — Progress Notes (Signed)
Patient ID: Hannah Gonzalez, female   DOB: [birth year], 57 y.o.   MRN: [id number]  Reason for Consult: Follow-up (dyspareunia)   Referred by [name].,*  Subjective:     HPI:  Hannah Gonzalez is a 57 y.o. female. She was seen previously for vaginal pain with penetration.  She has been on osphena for 3 months and  has seen improvement, she was able to be intimate with her husband. Has stopped the medication prior to planned hip surgery for next Monday, which may or not happen. She is having difficulty with insurance authorizing the procedure. She reports she is having frequent falls from her hip and back pain and is worried she may soon break her hip.   Some urinary urgency leading to incontinence. Discussed timed voids every 2-3 hours.   Past Medical History:  Diagnosis Date  . Basal cell carcinoma   . GAD (generalized anxiety disorder)   . GERD (gastroesophageal reflux disease)   . Palpitations   . Vitamin D deficiency    Family History  Problem Relation Age of Onset  . Hypertension Mother   . Hyperlipidemia Mother   . Heart disease Mother   . Heart failure Mother   . Arrhythmia Mother        A-Fib  . Arthritis Mother   . Cancer Mother   . Hypertension Brother   . Hyperlipidemia Brother   . Arthritis Father   . Hypertension Father   . Prostate cancer Father   . Breast cancer Cousin   . Cancer Maternal Aunt   . Breast cancer Maternal Aunt    Past Surgical History:  Procedure Laterality Date  . ANTERIOR CRUCIATE LIGAMENT REPAIR    . ARTHROSCOPIC REPAIR ACL     left  . BACK SURGERY    . BIOPSY BREAST     left   . BREAST BIOPSY Left    benign  . LAPAROSCOPY    . MYOMECTOMY    . PAROTIDECTOMY    . THYMECTOMY    . TONSILLECTOMY      Short Social History:  Social History   Tobacco Use  . Smoking status: Never Smoker  . Smokeless tobacco: Never Used  Substance Use Topics  . Alcohol use: No    No Known Allergies  Current Outpatient  Medications  Medication Sig Dispense Refill  . Cetirizine HCl 10 MG CAPS Take 10 mg by mouth daily.     . Cholecalciferol (VITAMIN D3) 25 MCG TABS Take 25 mcg by mouth daily.    . Cranberry-Vitamin C-Vitamin E 420-100-3 MG-MG-UNIT CAPS Take 4,200 mg by mouth daily.     . Flaxseed MISC Take 1,000 mg by mouth every other day. Alternating days with the fish oil    . ibuprofen (ADVIL,MOTRIN) 200 MG tablet Take 200-400 mg by mouth 2 (two) times daily as needed for moderate pain.     . Liniments (BLUE GEL/MUSCULAR RELIEF EX) Apply 1 application topically 2 (two) times daily.    . metoprolol succinate (TOPROL XL) 50 MG 24 hr tablet Take 1 tablet (50 mg total) by mouth daily. Take with or immediately following a meal. 90 tablet 3  . Multiple Vitamins-Minerals (ONE-A-DAY WOMENS 50 PLUS PO) Take 1 tablet by mouth daily.    . Omega-3 Fatty Acids (FISH OIL) 1200 MG CAPS Take 1,200 mg by mouth every other day. Alternating days with flaxseed oil    . tolterodine (DETROL LA) 4 MG 24 hr capsule TAKE 1 CAPSULE  BY MOUTH  DAILY 90 capsule 3  . TURMERIC PO Take 538 mg by mouth daily.     . Ospemifene (OSPHENA) 60 MG TABS Take 1 tablet by mouth daily. (Patient not taking: Reported on 07/19/2017) 30 tablet 11   No current facility-administered medications for this visit.     Review of Systems  Constitutional: Negative for chills, fatigue, fever and unexpected weight change.  HENT: Negative for trouble swallowing.  Eyes: Negative for loss of vision.  Respiratory: Negative for cough, shortness of breath and wheezing.  Cardiovascular: Negative for chest pain, leg swelling, palpitations and syncope.  GI: Negative for abdominal pain, blood in stool, diarrhea, nausea and vomiting.  GU: Negative for difficulty urinating, dysuria, frequency and hematuria.  Musculoskeletal: Positive for back pain and joint pain. Negative for leg pain.  Skin: Negative for rash.  Neurological: Negative for dizziness, headaches,  light-headedness, numbness and seizures.  Psychiatric: Negative for behavioral problem, confusion, depressed mood and sleep disturbance.        Objective:  Objective   Vitals:   07/19/17 0816  BP: 124/72  Pulse: (!) 106  Weight: 170 lb (77.1 kg)  Height: 5' 7.75" (1.721 m)   Body mass index is 26.04 kg/m.  Physical Exam  Constitutional: She is oriented to person, place, and time. She appears well-developed and well-nourished.  HENT:  Head: Normocephalic and atraumatic.  Eyes: EOM are normal.  Cardiovascular: Normal rate.  Pulmonary/Chest: Effort normal.  Abdominal: Soft.  Neurological: She is alert and oriented to person, place, and time.  Skin: Skin is warm and dry.  Psychiatric: She has a normal mood and affect. Her behavior is normal. Judgment and thought content normal.  Nursing note and vitals reviewed.      Assessment/Plan:     57 yo with vaginal dryness and pain with intercourse or vaginal penetration, symptoms improved with medication. Has had some side effects of hot flashes but this is tolerable. Discussed restarting  osphena once she is recovered from her hip surgery and able to ambulate, perhaps once she is at least 12 weeks postoperative. Discussed risk of thrombosis and DVT associated with osphena. Discussed yearly follow up to decide if she will continue therapy with osphena.  More than 25 minutes were spent face to face with the patient in the room with more than 50% of the time spent providing counseling and discussing the plan of management.   Adrian Prows MD [facility], [facility] 07/19/17 6:16 PM

## 2017-07-20 ENCOUNTER — Ambulatory Visit: Payer: Managed Care, Other (non HMO) | Admitting: Family Medicine

## 2017-07-20 VITALS — BP 144/82 | HR 100 | Temp 98.2°F | Resp 16 | Wt 173.0 lb

## 2017-07-20 DIAGNOSIS — I10 Essential (primary) hypertension: Secondary | ICD-10-CM

## 2017-07-20 DIAGNOSIS — M25551 Pain in right hip: Secondary | ICD-10-CM | POA: Diagnosis not present

## 2017-07-20 DIAGNOSIS — I479 Paroxysmal tachycardia, unspecified: Secondary | ICD-10-CM

## 2017-07-20 DIAGNOSIS — G8929 Other chronic pain: Secondary | ICD-10-CM

## 2017-07-20 NOTE — Progress Notes (Signed)
Hannah Gonzalez  MRN: [id number] DOB: 1960-11-09  Subjective:  HPI   The patient is a 57 year old female who presents for surgical clearance.  She is scheduled for hip replacement on 07/24/17.  She has had all her other pre-op testing including labs and EKG.   The patient states that her insurance company is not approving the surgery but her orthopedist is working on getting the approval so she wanted to proceed with today's appointment. She is unable to pursue any physical activity and has recently started to fall due to this hip issue.She needs the surgery.  Patient Active Problem List   Diagnosis Date Noted  . Paroxysmal tachycardia ([place]) 06/21/2017  . Chronic thoracic back pain 06/21/2017  . Chronic pain of right hip 06/21/2017  . Lumbar radiculopathy 07/13/2016  . Pain of both hip joints 07/12/2016  . Avitaminosis D 08/26/2014  . Anxiety, generalized 08/26/2014  . Acid reflux 08/26/2014  . Hypercholesteremia 08/26/2014  . Essential hypertension 08/26/2014  . 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 Tachycardia 05/23/2014  . Mixed hyperlipidemia 05/23/2014  . 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 Basal cell carcinoma   . GAD (generalized anxiety disorder)   . GERD (gastroesophageal reflux disease)   . Palpitations   . Vitamin D deficiency     Social History   Socioeconomic History  . Marital status: Married    Spouse name: Shanon Brow  . Number of children: 0  . Years of education: associates  . Highest education level: Not on file  Occupational History    Employer: [employer]  . Financial resource strain: Not on file  . Food insecurity:    Worry: Not on file    Inability: Not on file  . Transportation needs:    Medical: Not on file    Non-medical: Not on file  Tobacco Use  . Smoking status: Never Smoker  . Smokeless tobacco: Never Used  Substance and  Sexual Activity  . Alcohol use: No  . Drug use: No  . Sexual activity: Yes    Birth control/protection: Post-menopausal  Lifestyle  . Physical activity:    Days per week: 0 days    Minutes per session: 0 min  . Stress: Only a little  Relationships  . Social connections:    Talks on phone: Not on file    Gets together: Not on file    Attends religious service: Not on file    Active member of club or organization: Not on file    Attends meetings of clubs or organizations: Not on file    Relationship status: Not on file  . Intimate partner violence:    Fear of current or ex partner: Not on file    Emotionally abused: Not on file    Physically abused: Not on file    Forced sexual activity: Not on file  Other Topics Concern  . Not on file  Social History Narrative  . Not on file    Outpatient Encounter Medications as of 07/20/2017  Medication Sig  . Cetirizine HCl 10 MG CAPS Take 10 mg by mouth daily.   . Cholecalciferol (VITAMIN D3) 25 MCG TABS Take 25 mcg by mouth daily.  . Cranberry-Vitamin C-Vitamin E 420-100-3 MG-MG-UNIT CAPS Take 4,200 mg by mouth daily.   . Flaxseed MISC Take 1,000 mg by mouth every other day. Alternating days with  the fish oil  . ibuprofen (ADVIL,MOTRIN) 200 MG tablet Take 200-400 mg by mouth 2 (two) times daily as needed for moderate pain.   . Liniments (BLUE GEL/MUSCULAR RELIEF EX) Apply 1 application topically 2 (two) times daily.  . metoprolol succinate (TOPROL XL) 50 MG 24 hr tablet Take 1 tablet (50 mg total) by mouth daily. Take with or immediately following a meal.  . Multiple Vitamins-Minerals (ONE-A-DAY WOMENS 50 PLUS PO) Take 1 tablet by mouth daily.  . Omega-3 Fatty Acids (FISH OIL) 1200 MG CAPS Take 1,200 mg by mouth every other day. Alternating days with flaxseed oil  . Ospemifene (OSPHENA) 60 MG TABS Take 1 tablet by mouth daily. (Patient not taking: Reported on 07/19/2017)  . tolterodine (DETROL LA) 4 MG 24 hr capsule TAKE 1 CAPSULE BY MOUTH   DAILY  . TURMERIC PO Take 538 mg by mouth daily.    No facility-administered encounter medications on file as of 07/20/2017.     No Known Allergies  Review of Systems  Constitutional: Negative for fever, malaise/fatigue and weight loss.  Respiratory: Negative for cough, shortness of breath and wheezing.   Cardiovascular: Negative for chest pain, palpitations, orthopnea, claudication and leg swelling.  Gastrointestinal: Negative.   Genitourinary: Negative for dysuria.  Musculoskeletal: Positive for joint pain. Negative for back pain and neck pain.  Neurological: Negative for dizziness, weakness and headaches.       She has chronic tingling of her right foot since before last years back surgery.  Psychiatric/Behavioral: Negative.     Objective:  BP (!) 144/82 (BP Location: Right Arm, Patient Position: Sitting, Cuff Size: Normal)   Pulse 100   Temp 98.2 F (36.8 C) (Oral)   Resp 16   Wt 173 lb (78.5 kg)   BMI 26.50 kg/m   Physical Exam  Constitutional: She is oriented to person, place, and time and well-developed, well-nourished, and in no distress.  HENT:  Head: Normocephalic and atraumatic.  Right Ear: External ear normal.  Left Ear: External ear normal.  Nose: Nose normal.  Eyes: Conjunctivae are normal. No scleral icterus.  Neck: No thyromegaly present.  Cardiovascular: Normal rate, regular rhythm, normal heart sounds and intact distal pulses.  Pulmonary/Chest: Effort normal and breath sounds normal.  Abdominal: Soft.  Musculoskeletal: She exhibits no edema.  Lymphadenopathy:    She has no cervical adenopathy.  Neurological: She is alert and oriented to person, place, and time. Gait normal. GCS score is 15.  Skin: Skin is warm and dry.  Psychiatric: Mood, memory, affect and judgment normal.    Assessment and Plan :  Hip Pain Pt cleared medically for hip surgery. HTN/SVT Controlled. H/o Thymoma resection  I have done the exam and reviewed the chart and it is  accurate to the best of my knowledge. Development worker, community has been used and  any errors in dictation or transcription are unintentional. Miguel Aschoff M.D. [facility]

## 2017-07-24 ENCOUNTER — Inpatient Hospital Stay
Admission: RE | Admit: 2017-07-24 | Payer: Managed Care, Other (non HMO) | Source: Ambulatory Visit | Admitting: Orthopedic Surgery

## 2017-07-24 ENCOUNTER — Encounter: Admission: RE | Payer: Self-pay | Source: Ambulatory Visit

## 2017-07-24 SURGERY — ARTHROPLASTY, HIP, TOTAL, ANTERIOR APPROACH
Anesthesia: Choice | Laterality: Right

## 2017-08-28 ENCOUNTER — Other Ambulatory Visit: Payer: Self-pay | Admitting: Family Medicine

## 2017-12-10 ENCOUNTER — Other Ambulatory Visit: Payer: Self-pay | Admitting: Family Medicine

## 2017-12-27 ENCOUNTER — Ambulatory Visit: Payer: Self-pay | Admitting: Orthopedic Surgery

## 2017-12-27 ENCOUNTER — Other Ambulatory Visit: Payer: Managed Care, Other (non HMO)

## 2017-12-29 ENCOUNTER — Other Ambulatory Visit: Payer: Self-pay

## 2017-12-29 ENCOUNTER — Encounter
Admission: RE | Admit: 2017-12-29 | Discharge: 2017-12-29 | Disposition: A | Discharge status: Home / Self Care | Payer: Managed Care, Other (non HMO) | Source: Ambulatory Visit | Attending: Orthopedic Surgery | Admitting: Orthopedic Surgery

## 2017-12-29 DIAGNOSIS — Z01818 Encounter for other preprocedural examination: Secondary | ICD-10-CM | POA: Insufficient documentation

## 2017-12-29 HISTORY — DX: Unspecified osteoarthritis, unspecified site: M19.90

## 2017-12-29 LAB — SURGICAL PCR SCREEN
MRSA, PCR: NEGATIVE
STAPHYLOCOCCUS AUREUS: NEGATIVE

## 2017-12-29 LAB — PROTIME-INR
INR: 0.94
Prothrombin Time: 12.5 seconds (ref 11.4–15.2)

## 2017-12-29 LAB — TYPE AND SCREEN
ABO/RH(D): O POS
ANTIBODY SCREEN: NEGATIVE

## 2017-12-29 LAB — URINALYSIS, ROUTINE W REFLEX MICROSCOPIC
BILIRUBIN URINE: NEGATIVE
Glucose, UA: NEGATIVE mg/dL
Hgb urine dipstick: NEGATIVE
Ketones, ur: NEGATIVE mg/dL
Leukocytes, UA: NEGATIVE
NITRITE: NEGATIVE
PH: 5 (ref 5.0–8.0)
Protein, ur: NEGATIVE mg/dL
SPECIFIC GRAVITY, URINE: 1.014 (ref 1.005–1.030)

## 2017-12-29 LAB — CBC
HCT: 40.9 % (ref 36.0–46.0)
Hemoglobin: 13.3 g/dL (ref 12.0–15.0)
MCH: 29.7 pg (ref 26.0–34.0)
MCHC: 32.5 g/dL (ref 30.0–36.0)
MCV: 91.3 fL (ref 80.0–100.0)
PLATELETS: 236 10*3/uL (ref 150–400)
RBC: 4.48 MIL/uL (ref 3.87–5.11)
RDW: 12.5 % (ref 11.5–15.5)
WBC: 4.6 10*3/uL (ref 4.0–10.5)
nRBC: 0 % (ref 0.0–0.2)

## 2017-12-29 LAB — BASIC METABOLIC PANEL
ANION GAP: 7 (ref 5–15)
BUN: 18 mg/dL (ref 6–20)
CALCIUM: 8.9 mg/dL (ref 8.9–10.3)
CO2: 28 mmol/L (ref 22–32)
CREATININE: 0.82 mg/dL (ref 0.44–1.00)
Chloride: 106 mmol/L (ref 98–111)
GFR calc Af Amer: >60 mL/min (ref >60)
GFR calc non Af Amer: >60 mL/min (ref >60)
GLUCOSE: 72 mg/dL (ref 70–99)
Potassium: 3.8 mmol/L (ref 3.5–5.1)
Sodium: 141 mmol/L (ref 135–145)

## 2017-12-29 LAB — APTT: aPTT: 29 seconds (ref 24–36)

## 2017-12-29 NOTE — Patient Instructions (Signed)
Your procedure is scheduled on: Wednesday 01/10/18.  Report to DAY SURGERY DEPARTMENT LOCATED ON 2ND FLOOR MEDICAL MALL ENTRANCE. To find out your arrival time please call [phone] between 1PM - 3PM on Tuesday 01/09/18.  Remember: Instructions that are not followed completely may result in serious medical risk, up to and including death, or upon the discretion of your surgeon and anesthesiologist your surgery may need to be rescheduled.     _X__ 1. Do not eat food after midnight the night before your procedure.                 No gum chewing or hard candies. You may drink clear liquids up to 2 hours                 before you are scheduled to arrive for your surgery- DO NOT drink clear                 liquids within 2 hours of the start of your surgery.                 Clear Liquids include:  water, apple juice without pulp, clear carbohydrate                 drink such as Clearfast or Gatorade, Black Coffee or Tea (Do not add                 anything to coffee or tea).  __X__2.  On the morning of surgery brush your teeth with toothpaste and water, you may rinse your mouth with mouthwash if you wish.  Do not swallow any toothpaste or mouthwash.     _X__ 3.  No Alcohol for 24 hours before or after surgery.   _X__ 4.  Do Not Smoke or use e-cigarettes For 24 Hours Prior to Your Surgery.                 Do not use any chewable tobacco products for at least 6 hours prior to                 surgery.  __X__ 5.  Notify your doctor if there is any change in your medical condition      (cold, fever, infections).     Do not wear jewelry, make-up, hairpins, clips or nail polish. Do not wear lotions, powders, or perfumes.  Do not shave 48 hours prior to surgery. Men may shave face and neck. Do not bring valuables to the hospital.    [facility] is not responsible for any belongings or valuables.  Contacts, dentures/partials or body piercings may not be worn into surgery. Bring a case for  your contacts, glasses or hearing aids, a denture cup will be supplied. Leave your suitcase in the car. After surgery it may be brought to your room. For patients admitted to the hospital, discharge time is determined by your treatment team.   Patients discharged the day of surgery will not be allowed to drive home.   Please read over the following fact sheets that you were given:   MRSA Information  __X__ Take these medicines the morning of surgery with A SIP OF WATER:     1. Cetirizine HCl 10 MG CAPS  2. metoprolol succinate (TOPROL-XL) 50 MG 24 hr tablet    __X__ Use CHG Soap as directed  __X__ Stop Anti-inflammatories 7 days before surgery (Stop on 12/31/17) such as Advil, Ibuprofen, Motrin, BC or Goodies Powder, Naprosyn,  Naproxen, Aleve, Aspirin, Meloxicam. May take Tylenol if needed for pain or discomfort.    __X__ Stop the following herbal supplements:  Flaxseed MISC   CRANBERRY PO  Apple Cider Vinegar 600 MG CAPS  Omega-3 Fatty Acids (FISH OIL) 1200 MG CAPS

## 2018-01-09 MED ORDER — CEFAZOLIN SODIUM-DEXTROSE 2-4 GM/100ML-% IV SOLN
2.0000 g | INTRAVENOUS | Status: AC
  Administered 2018-01-10: 2 g via INTRAVENOUS

## 2018-01-09 MED ORDER — TRANEXAMIC ACID-NACL 1000-0.7 MG/100ML-% IV SOLN
1000.0000 mg | INTRAVENOUS | Status: AC
  Administered 2018-01-10: 1000 mg via INTRAVENOUS
  Filled 2018-01-09: qty 100

## 2018-01-10 ENCOUNTER — Inpatient Hospital Stay: Payer: Managed Care, Other (non HMO)

## 2018-01-10 ENCOUNTER — Encounter
Admission: RE | Disposition: A | Discharge status: Home / Self Care | Payer: Self-pay | Source: Home / Self Care | Attending: Orthopedic Surgery

## 2018-01-10 ENCOUNTER — Inpatient Hospital Stay
Admission: RE | Admit: 2018-01-10 | Discharge: 2018-01-12 | DRG: 470 | Disposition: A | Discharge status: Home / Self Care | Payer: Managed Care, Other (non HMO) | Attending: Orthopedic Surgery | Admitting: Orthopedic Surgery

## 2018-01-10 ENCOUNTER — Other Ambulatory Visit: Payer: Self-pay

## 2018-01-10 ENCOUNTER — Inpatient Hospital Stay: Payer: Managed Care, Other (non HMO) | Admitting: Anesthesiology

## 2018-01-10 ENCOUNTER — Encounter: Payer: Self-pay | Admitting: *Deleted

## 2018-01-10 DIAGNOSIS — M1611 Unilateral primary osteoarthritis, right hip: Principal | ICD-10-CM | POA: Diagnosis present

## 2018-01-10 DIAGNOSIS — Z85828 Personal history of other malignant neoplasm of skin: Secondary | ICD-10-CM

## 2018-01-10 DIAGNOSIS — Z419 Encounter for procedure for purposes other than remedying health state, unspecified: Secondary | ICD-10-CM

## 2018-01-10 DIAGNOSIS — K219 Gastro-esophageal reflux disease without esophagitis: Secondary | ICD-10-CM | POA: Diagnosis present

## 2018-01-10 DIAGNOSIS — Z09 Encounter for follow-up examination after completed treatment for conditions other than malignant neoplasm: Secondary | ICD-10-CM

## 2018-01-10 HISTORY — PX: TOTAL HIP ARTHROPLASTY: SHX124

## 2018-01-10 SURGERY — ARTHROPLASTY, HIP, TOTAL, ANTERIOR APPROACH
Anesthesia: Spinal | Site: Hip | Laterality: Right

## 2018-01-10 MED ORDER — FAMOTIDINE 20 MG PO TABS: 20.0000 mg | ORAL_TABLET | Freq: Once | ORAL | Status: DC

## 2018-01-10 MED ORDER — ACETAMINOPHEN 500 MG PO TABS
500.0000 mg | ORAL_TABLET | Freq: Four times a day (QID) | ORAL | Status: AC
  Administered 2018-01-10 – 2018-01-11 (×4): 500 mg via ORAL
  Filled 2018-01-10 (×4): qty 1

## 2018-01-10 MED ORDER — BUPIVACAINE-EPINEPHRINE (PF) 0.25% -1:200000 IJ SOLN
INTRAMUSCULAR | Status: AC
  Filled 2018-01-10: qty 30

## 2018-01-10 MED ORDER — LACTATED RINGERS IV SOLN
INTRAVENOUS | Status: DC
  Administered 2018-01-10 (×2): via INTRAVENOUS

## 2018-01-10 MED ORDER — ACETAMINOPHEN 500 MG PO TABS
1000.0000 mg | ORAL_TABLET | Freq: Once | ORAL | Status: AC
  Administered 2018-01-10: 1000 mg via ORAL

## 2018-01-10 MED ORDER — LORATADINE 10 MG PO TABS
10.0000 mg | ORAL_TABLET | Freq: Every day | ORAL | Status: DC
  Administered 2018-01-11 – 2018-01-12 (×2): 10 mg via ORAL
  Filled 2018-01-10 (×2): qty 1

## 2018-01-10 MED ORDER — DOCUSATE SODIUM 100 MG PO CAPS
100.0000 mg | ORAL_CAPSULE | Freq: Two times a day (BID) | ORAL | Status: DC
  Administered 2018-01-10 – 2018-01-11 (×4): 100 mg via ORAL
  Filled 2018-01-10 (×5): qty 1

## 2018-01-10 MED ORDER — GABAPENTIN 300 MG PO CAPS
ORAL_CAPSULE | ORAL | Status: AC
  Administered 2018-01-10: 300 mg via ORAL
  Filled 2018-01-10: qty 1

## 2018-01-10 MED ORDER — CEFAZOLIN SODIUM-DEXTROSE 1-4 GM/50ML-% IV SOLN
1.0000 g | Freq: Four times a day (QID) | INTRAVENOUS | Status: AC
  Administered 2018-01-10 (×2): 1 g via INTRAVENOUS
  Filled 2018-01-10 (×2): qty 50

## 2018-01-10 MED ORDER — BISACODYL 10 MG RE SUPP: 10.0000 mg | Freq: Every day | RECTAL | Status: DC | PRN

## 2018-01-10 MED ORDER — ONDANSETRON HCL 4 MG/2ML IJ SOLN: 4.0000 mg | Freq: Four times a day (QID) | INTRAMUSCULAR | Status: DC | PRN

## 2018-01-10 MED ORDER — GABAPENTIN 300 MG PO CAPS
300.0000 mg | ORAL_CAPSULE | Freq: Once | ORAL | Status: AC
  Administered 2018-01-10: 300 mg via ORAL

## 2018-01-10 MED ORDER — MIDAZOLAM HCL 2 MG/2ML IJ SOLN
INTRAMUSCULAR | Status: AC
  Filled 2018-01-10: qty 2

## 2018-01-10 MED ORDER — PROPOFOL 500 MG/50ML IV EMUL
INTRAVENOUS | Status: AC
  Filled 2018-01-10: qty 50

## 2018-01-10 MED ORDER — MIDAZOLAM HCL 5 MG/5ML IJ SOLN
INTRAMUSCULAR | Status: DC | PRN
  Administered 2018-01-10: 2 mg via INTRAVENOUS

## 2018-01-10 MED ORDER — BUPIVACAINE HCL (PF) 0.5 % IJ SOLN
INTRAMUSCULAR | Status: DC | PRN
  Administered 2018-01-10: 3 mL

## 2018-01-10 MED ORDER — LACTATED RINGERS IV SOLN
INTRAVENOUS | Status: DC
  Administered 2018-01-10 (×2): via INTRAVENOUS

## 2018-01-10 MED ORDER — HYDROCODONE-ACETAMINOPHEN 5-325 MG PO TABS: 1.0000 | ORAL_TABLET | ORAL | Status: DC | PRN

## 2018-01-10 MED ORDER — FESOTERODINE FUMARATE ER 4 MG PO TB24
4.0000 mg | ORAL_TABLET | Freq: Every day | ORAL | Status: DC
  Administered 2018-01-10 – 2018-01-12 (×3): 4 mg via ORAL
  Filled 2018-01-10 (×3): qty 1

## 2018-01-10 MED ORDER — OSPEMIFENE 60 MG PO TABS: 1.0000 | ORAL_TABLET | Freq: Every day | ORAL | Status: DC

## 2018-01-10 MED ORDER — SCOPOLAMINE 1 MG/3DAYS TD PT72
MEDICATED_PATCH | TRANSDERMAL | Status: AC
  Administered 2018-01-10: 1.5 mg via TRANSDERMAL
  Filled 2018-01-10: qty 1

## 2018-01-10 MED ORDER — SCOPOLAMINE 1 MG/3DAYS TD PT72
1.0000 | MEDICATED_PATCH | TRANSDERMAL | Status: DC
  Administered 2018-01-10: 1.5 mg via TRANSDERMAL

## 2018-01-10 MED ORDER — ASPIRIN 81 MG PO CHEW
81.0000 mg | CHEWABLE_TABLET | Freq: Two times a day (BID) | ORAL | Status: DC
  Administered 2018-01-10 – 2018-01-12 (×4): 81 mg via ORAL
  Filled 2018-01-10 (×4): qty 1

## 2018-01-10 MED ORDER — METOCLOPRAMIDE HCL 10 MG PO TABS: 5.0000 mg | ORAL_TABLET | Freq: Three times a day (TID) | ORAL | Status: DC | PRN

## 2018-01-10 MED ORDER — PHENOL 1.4 % MT LIQD: 1.0000 | OROMUCOSAL | Status: DC | PRN

## 2018-01-10 MED ORDER — CEFAZOLIN SODIUM-DEXTROSE 2-4 GM/100ML-% IV SOLN
INTRAVENOUS | Status: AC
  Filled 2018-01-10: qty 100

## 2018-01-10 MED ORDER — METOPROLOL SUCCINATE ER 50 MG PO TB24
50.0000 mg | ORAL_TABLET | Freq: Every day | ORAL | Status: DC
  Administered 2018-01-11 – 2018-01-12 (×2): 50 mg via ORAL
  Filled 2018-01-10 (×2): qty 1

## 2018-01-10 MED ORDER — MENTHOL 3 MG MT LOZG: 1.0000 | LOZENGE | OROMUCOSAL | Status: DC | PRN

## 2018-01-10 MED ORDER — VITAMIN D 25 MCG (1000 UNIT) PO TABS
1000.0000 [IU] | ORAL_TABLET | Freq: Every day | ORAL | Status: DC
  Administered 2018-01-10 – 2018-01-12 (×3): 1000 [IU] via ORAL
  Filled 2018-01-10 (×3): qty 1

## 2018-01-10 MED ORDER — MAGNESIUM HYDROXIDE 400 MG/5ML PO SUSP
30.0000 mL | Freq: Every day | ORAL | Status: DC | PRN
  Administered 2018-01-11: 30 mL via ORAL
  Filled 2018-01-10: qty 30

## 2018-01-10 MED ORDER — ACETAMINOPHEN 500 MG PO TABS
ORAL_TABLET | ORAL | Status: AC
  Administered 2018-01-10: 1000 mg via ORAL
  Filled 2018-01-10: qty 2

## 2018-01-10 MED ORDER — FENTANYL CITRATE (PF) 100 MCG/2ML IJ SOLN: 25.0000 ug | INTRAMUSCULAR | Status: DC | PRN

## 2018-01-10 MED ORDER — MORPHINE SULFATE (PF) 2 MG/ML IV SOLN: 0.5000 mg | INTRAVENOUS | Status: DC | PRN

## 2018-01-10 MED ORDER — ACETAMINOPHEN 325 MG PO TABS
325.0000 mg | ORAL_TABLET | Freq: Four times a day (QID) | ORAL | Status: DC | PRN
  Administered 2018-01-11 – 2018-01-12 (×3): 650 mg via ORAL
  Filled 2018-01-10 (×3): qty 2

## 2018-01-10 MED ORDER — ONDANSETRON HCL 4 MG PO TABS: 4.0000 mg | ORAL_TABLET | Freq: Four times a day (QID) | ORAL | Status: DC | PRN

## 2018-01-10 MED ORDER — SODIUM CHLORIDE 0.9 % IV SOLN
INTRAVENOUS | Status: DC | PRN
  Administered 2018-01-10: 30 ug/min via INTRAVENOUS

## 2018-01-10 MED ORDER — MAGNESIUM CITRATE PO SOLN: 1.0000 | Freq: Once | ORAL | Status: DC | PRN

## 2018-01-10 MED ORDER — OXYCODONE HCL 5 MG PO TABS: 5.0000 mg | ORAL_TABLET | Freq: Once | ORAL | Status: DC | PRN

## 2018-01-10 MED ORDER — KETOROLAC TROMETHAMINE 15 MG/ML IJ SOLN
15.0000 mg | Freq: Four times a day (QID) | INTRAMUSCULAR | Status: AC
  Administered 2018-01-10 – 2018-01-11 (×4): 15 mg via INTRAVENOUS
  Filled 2018-01-10 (×4): qty 1

## 2018-01-10 MED ORDER — METOCLOPRAMIDE HCL 5 MG/ML IJ SOLN: 5.0000 mg | Freq: Three times a day (TID) | INTRAMUSCULAR | Status: DC | PRN

## 2018-01-10 MED ORDER — CHLORHEXIDINE GLUCONATE 4 % EX LIQD: 60.0000 mL | Freq: Once | CUTANEOUS | Status: DC

## 2018-01-10 MED ORDER — BACITRACIN 50000 UNITS IM SOLR
INTRAMUSCULAR | Status: AC
  Filled 2018-01-10: qty 2

## 2018-01-10 MED ORDER — PROPOFOL 500 MG/50ML IV EMUL
INTRAVENOUS | Status: DC | PRN
  Administered 2018-01-10: 100 ug/kg/min via INTRAVENOUS

## 2018-01-10 MED ORDER — BUPIVACAINE-EPINEPHRINE (PF) 0.25% -1:200000 IJ SOLN
INTRAMUSCULAR | Status: DC | PRN
  Administered 2018-01-10: 26 mL via PERINEURAL

## 2018-01-10 MED ORDER — OXYCODONE HCL 5 MG/5ML PO SOLN: 5.0000 mg | Freq: Once | ORAL | Status: DC | PRN

## 2018-01-10 MED ORDER — PROPOFOL 10 MG/ML IV BOLUS
INTRAVENOUS | Status: AC
  Filled 2018-01-10: qty 20

## 2018-01-10 MED ORDER — SODIUM CHLORIDE 0.9 % IR SOLN
Status: DC | PRN
  Administered 2018-01-10: 2000 mL

## 2018-01-10 MED ORDER — LACTATED RINGERS IV SOLN
INTRAVENOUS | Status: DC
  Administered 2018-01-10: 07:00:00 via INTRAVENOUS

## 2018-01-10 MED ORDER — HYDROCODONE-ACETAMINOPHEN 7.5-325 MG PO TABS
1.0000 | ORAL_TABLET | ORAL | Status: DC | PRN
  Administered 2018-01-10: 1 via ORAL
  Filled 2018-01-10: qty 1

## 2018-01-10 SURGICAL SUPPLY — 49 items
BLADE SAGITTAL WIDE XTHICK NO (BLADE) ×3 IMPLANT
BNDG COHESIVE 4X5 TAN STRL (GAUZE/BANDAGES/DRESSINGS) ×6 IMPLANT
BRUSH SCRUB EZ  4% CHG (MISCELLANEOUS) ×2
BRUSH SCRUB EZ 4% CHG (MISCELLANEOUS) ×1 IMPLANT
CHLORAPREP W/TINT 26ML (MISCELLANEOUS) ×3 IMPLANT
COVER HOLE (Hips) ×3 IMPLANT
COVER WAND RF STERILE (DRAPES) IMPLANT
CUP R3 50MM (Hips) ×3 IMPLANT
DRAPE C-ARM 42X72 X-RAY (DRAPES) ×3 IMPLANT
DRAPE SHEET LG 3/4 BI-LAMINATE (DRAPES) ×6 IMPLANT
DRAPE STERI IOBAN 125X83 (DRAPES) ×3 IMPLANT
DRSG AQUACEL AG ADV 3.5X10 (GAUZE/BANDAGES/DRESSINGS) ×3 IMPLANT
DRSG AQUACEL AG ADV 3.5X14 (GAUZE/BANDAGES/DRESSINGS) IMPLANT
ELECT BLADE 6.5 EXT (BLADE) ×3 IMPLANT
ELECT REM PT RETURN 9FT ADLT (ELECTROSURGICAL) ×3
ELECTRODE REM PT RTRN 9FT ADLT (ELECTROSURGICAL) ×1 IMPLANT
GAUZE PETRO XEROFOAM 1X8 (MISCELLANEOUS) ×3 IMPLANT
GLOVE INDICATOR 8.0 STRL GRN (GLOVE) ×3 IMPLANT
GLOVE SURG ORTHO 8.0 STRL STRW (GLOVE) ×3 IMPLANT
GOWN STRL REUS W/ TWL LRG LVL3 (GOWN DISPOSABLE) ×2 IMPLANT
GOWN STRL REUS W/ TWL XL LVL3 (GOWN DISPOSABLE) ×1 IMPLANT
GOWN STRL REUS W/TWL LRG LVL3 (GOWN DISPOSABLE) ×4
GOWN STRL REUS W/TWL XL LVL3 (GOWN DISPOSABLE) ×2
HEAD FEMORAL SZ21 -3 OXINIUM (Head) ×3 IMPLANT
HOOD PEEL AWAY FLYTE STAYCOOL (MISCELLANEOUS) ×9 IMPLANT
IV NS 1000ML (IV SOLUTION) ×2
IV NS 1000ML BAXH (IV SOLUTION) ×1 IMPLANT
KIT PATIENT CARE HANA TABLE (KITS) ×3 IMPLANT
KIT TURNOVER CYSTO (KITS) ×3 IMPLANT
LINER 0 DEG 32X50MM (Hips) ×3 IMPLANT
MAT ABSORB  FLUID 56X50 GRAY (MISCELLANEOUS) ×2
MAT ABSORB FLUID 56X50 GRAY (MISCELLANEOUS) ×1 IMPLANT
NDL SAFETY ECLIPSE 18X1.5 (NEEDLE) ×2 IMPLANT
NEEDLE HYPO 18GX1.5 SHARP (NEEDLE) ×4
NEEDLE HYPO 22GX1.5 SAFETY (NEEDLE) ×3 IMPLANT
NEEDLE SPNL 20GX3.5 QUINCKE YW (NEEDLE) ×3 IMPLANT
PACK HIP PROSTHESIS (MISCELLANEOUS) ×3 IMPLANT
PADDING CAST BLEND 4X4 NS (MISCELLANEOUS) ×6 IMPLANT
PILLOW ABDUCTION MEDIUM (MISCELLANEOUS) ×3 IMPLANT
PULSAVAC PLUS IRRIG FAN TIP (DISPOSABLE) ×3
SCREW 6.5X25MM (Screw) ×3 IMPLANT
STAPLER SKIN PROX 35W (STAPLE) ×3 IMPLANT
STEM LATERAL COLLAR POLARSTEM (Stem) ×3 IMPLANT
SUT BONE WAX W31G (SUTURE) ×3 IMPLANT
SUT DVC 2 QUILL PDO  T11 36X36 (SUTURE) ×2
SUT DVC 2 QUILL PDO T11 36X36 (SUTURE) ×1 IMPLANT
SUT VIC AB 2-0 CT1 18 (SUTURE) ×3 IMPLANT
SYR 20CC LL (SYRINGE) ×3 IMPLANT
TIP FAN IRRIG PULSAVAC PLUS (DISPOSABLE) ×1 IMPLANT

## 2018-01-10 NOTE — Anesthesia Procedure Notes (Signed)
Date/Time: 01/10/2018 8:06 AM Performed by: Nelda Marseille, CRNA Pre-anesthesia Checklist: Patient identified, Emergency Drugs available, Suction available, Patient being monitored and Timeout performed Oxygen Delivery Method: Simple face mask

## 2018-01-10 NOTE — Op Note (Signed)
01/10/2018  9:39 AM  PATIENT:  Hannah Gonzalez   MRN: [id number]  PRE-OPERATIVE DIAGNOSIS:  Osteoarthritis right hip   POST-OPERATIVE DIAGNOSIS: Same  Procedure: Right Total Hip Replacement  Surgeon: Elyn Aquas. Harlow Mares, MD   Assist: Carlynn Spry, PA-C  Anesthesia: Spinal   EBL: 200 mL   Specimens: None   Drains: None   Components used: A size 2 lateral Polarstem Smith [employer]hew, R3 size 50 mm shell, and a 32 by -3 mm head    Description of the procedure in detail: After informed consent was obtained and the appropriate extremity marked in the pre-operative holding area, the patient was taken to the operating room and placed in the supine position on the fracture table. All pressure points were well padded and bilateral lower extremities were place in traction spars. The hip was prepped and draped in standard sterile fashion. A spinal anesthetic had been delivered by the anesthesia team. The skin and subcutaneous tissues were injected with a mixture of Marcaine with epinephrine for post-operative pain. A longitudinal incision approximately 10 cm in length was carried out from the anterior superior iliac spine to the greater trochanter. The tensor fascia was divided and blunt dissection was taken down to the level of the joint capsule. The lateral circumflex vessels were cauterized. Deep retractors were placed and a portion of the anterior capsule was excised. Using fluoroscopy the neck cut was planned and carried out with a sagittal saw. The head was passed from the field with use of a corkscrew and hip skid. Deep retractors were placed along the acetabulum and the degenerative labrum and large osteophytes were removed with a Rongeur. The cup was sequentially reamed to a size 50 mm. The wound was irrigated and using fluoroscopy the size 50 mm cup was impacted in to anatomic position. A single screw was placed followed by a threaded hole cover. The final liner was impacted in to  position. Attention was then turned to the proximal femur. The leg was placed in extension and external rotation. The canal was opened and sequentially broached to a size 2. The lateralized trial components were placed and the hip relocated. The components were found to be in good position using fluoroscopy. The hip was dislocated and the trial components removed. The final components were impacted in to position and the hip relocated. The final components were again check with fluoroscopy and found to be in good position. Hemostasis was achieved with electrocautery. The deep capsule was injected with Marcaine and epinephrine. The wound was irrigated with bacitracin laced normal saline and the tensor fascia closed with #2 Quill suture. The subcutaneous tissues were closed with 2-0 vicryl and staples for the skin. A sterile dressing was applied and an abduction pillow. Patient tolerated the procedure well and there were no apparent complication. Patient was taken to the recovery room in good condition.   Kurtis Bushman, MD

## 2018-01-10 NOTE — Progress Notes (Signed)
This RT entered room to set up IS with patient, but patient in chair at bedside with PT and her blood pressure was low. IS was left in patients room to be set up at a later time once patient is feeling better.

## 2018-01-10 NOTE — Transfer of Care (Signed)
Immediate Anesthesia Transfer of Care Note  Patient: Hannah Gonzalez  Procedure(s) Performed: TOTAL HIP ARTHROPLASTY ANTERIOR APPROACH (Right Hip)  Patient Location: PACU  Anesthesia Type:Spinal  Level of Consciousness: awake, alert  and oriented  Airway & Oxygen Therapy: Patient Spontanous Breathing and Patient connected to nasal cannula oxygen  Post-op Assessment: Report given to RN and Post -op Vital signs reviewed and stable  Post vital signs: Reviewed and stable  Last Vitals:  Vitals Value Taken Time  BP 110/63 01/10/2018  9:43 AM  Temp 36.3 C 01/10/2018  9:43 AM  Pulse 80 01/10/2018  9:44 AM  Resp 16 01/10/2018  9:44 AM  SpO2 99 % 01/10/2018  9:44 AM  Vitals shown include unvalidated device data.  Last Pain:  Vitals:   01/10/18 0943  TempSrc:   PainSc: Asleep         Complications: No apparent anesthesia complications

## 2018-01-10 NOTE — Anesthesia Preprocedure Evaluation (Signed)
Anesthesia Evaluation  Patient identified by MRN, date of birth, ID band Patient awake    Reviewed: Allergy & Precautions, H&P , NPO status , Patient's Chart, lab work & pertinent test results  History of Anesthesia Complications Negative for: history of anesthetic complications  Airway Mallampati: III  TM Distance: <3 FB Neck ROM: full    Dental  (+) Chipped   Pulmonary neg pulmonary ROS, neg shortness of breath,           Cardiovascular Exercise Tolerance: Good hypertension, (-) angina(-) Past MI and (-) DOE      Neuro/Psych PSYCHIATRIC DISORDERS  Neuromuscular disease    GI/Hepatic Neg liver ROS, GERD  Medicated and Controlled,  Endo/Other  negative endocrine ROS  Renal/GU      Musculoskeletal   Abdominal   Peds  Hematology negative hematology ROS (+)   Anesthesia Other Findings Past Medical History: No date: Arthritis No date: Basal cell carcinoma     Comment:  Basil cell left thigh No date: Palpitations No date: Vitamin D deficiency  Past Surgical History: No date: ANTERIOR CRUCIATE LIGAMENT REPAIR No date: ARTHROSCOPIC REPAIR ACL     Comment:  left No date: BACK SURGERY No date: BIOPSY BREAST     Comment:  left  No date: BREAST BIOPSY; Left     Comment:  benign No date: LAPAROSCOPY No date: MYOMECTOMY No date: PAROTIDECTOMY No date: THYMECTOMY No date: TONSILLECTOMY  BMI    Body Mass Index:  27.07 kg/m      Reproductive/Obstetrics negative OB ROS                            Anesthesia Physical Anesthesia Plan  ASA: III  Anesthesia Plan: Spinal   Post-op Pain Management:    Induction:   PONV Risk Score and Plan:   Airway Management Planned: Natural Airway and Nasal Cannula  Additional Equipment:   Intra-op Plan:   Post-operative Plan:   Informed Consent: I have reviewed the patients History and Physical, chart, labs and discussed the procedure  including the risks, benefits and alternatives for the proposed anesthesia with the patient or authorized representative who has indicated his/her understanding and acceptance.   Dental Advisory Given  Plan Discussed with: Anesthesiologist, CRNA and Surgeon  Anesthesia Plan Comments: (Patient reports no bleeding problems and no anticoagulant use.  Plan for spinal with backup GA  Patient consented for risks of anesthesia including but not limited to:  - adverse reactions to medications - risk of bleeding, infection, nerve damage and headache - risk of failed spinal - damage to teeth, lips or other oral mucosa - sore throat or hoarseness - Damage to heart, brain, lungs or loss of life  Patient voiced understanding.)        Anesthesia Quick Evaluation

## 2018-01-10 NOTE — Evaluation (Signed)
Physical Therapy Evaluation Patient Details Name: Hannah Gonzalez MRN: [id number] DOB: [birth year] Today's Date: 01/10/2018   History of Present Illness  Pt is a 57 y.o. female s/p R THR 01/10/18 (anterior).  PMH includes htn, basal cell carcinoma L thigh, ACL repair, back surgery.  Clinical Impression  Prior to hospital admission, pt was independent and working full time.  Pt lives with her husband in 1 level home with steps to enter.  Prior to OOB mobility, pt's BP 120/67, HR 87 bpm, and O2 99% on room air.  Pt min assist semi-supine to sit; min assist to stand with RW; and CGA to ambulate 15 feet with RW.  After pt sat in chair, pt reporting not feeling well (feeling lightheaded) and pt also appearing pale.  Pt's BP 82/38 with HR 47-50 bpm and O2 99% on room air (pt reclined flat in recliner with LE's elevated).  Nurse notified immediately and reported she would come assess.  A few minutes later pt still appearing pale and BP retaken (pt still laying flat in recliner with LE's elevated) and BP 76/45 with HR 56 bpm.  Nurse called again and arrived to assess pt.  Pt assisted back to bed (pt laying flat in bed) and after a few minutes pt's color started to come back and reported feeling better.  Nurse and nursing tech present end of session getting pt set up in bed and reported plan to take vitals again after pt rested a little bit in bed.  Nurse was notified of pt's vitals during session.  Pt would benefit from skilled PT to address noted impairments and functional limitations (see below for any additional details).  Upon hospital discharge, plan for OP PT.    Follow Up Recommendations Outpatient PT(pt has OP PT appt set up for Monday)    Equipment Recommendations  Rolling walker with 5" wheels;3in1 (PT)    Recommendations for Other Services       Precautions / Restrictions Precautions Precautions: Anterior Hip;Fall Precaution Booklet Issued: Yes (comment) Restrictions Weight Bearing  Restrictions: Yes RLE Weight Bearing: Weight bearing as tolerated      Mobility  Bed Mobility Overal bed mobility: Needs Assistance Bed Mobility: Supine to Sit;Sit to Supine     Supine to sit: Min assist;HOB elevated Sit to supine: Total assist   General bed mobility comments: min assist for R LE semi-supine to sit with increased time to perform; total assist sit to supine d/t BP concerns  Transfers Overall transfer level: Needs assistance Equipment used: Rolling walker (2 wheeled) Transfers: Sit to/from [facility] Sit to Stand: Min assist Stand pivot transfers: Mod assist       General transfer comment: assist to initiate stand up to RW with vc's for UE/LE placement and technique; mod assist stand pivot recliner back to bed (increased assist d/t BP concerns)  Ambulation/Gait Ambulation/Gait assistance: Min guard Gait Distance (Feet): 15 Feet Assistive device: Rolling walker (2 wheeled)   Gait velocity: decreased   General Gait Details: mild decreased stance time R LE; decreased B step length/foot clearance/heelstrike; vc's for walker use initially; steady with RW use  Stairs            Wheelchair Mobility    Modified Rankin (Stroke Patients Only)       Balance Overall balance assessment: Needs assistance Sitting-balance support: No upper extremity supported;Feet supported Sitting balance-Leahy Scale: Good Sitting balance - Comments: steady sitting reaching within BOS   Standing balance support: Single extremity supported Standing balance-Leahy  Scale: Poor Standing balance comment: pt requiring at least single UE support for static standing balance                             Pertinent Vitals/Pain Pain Assessment: 0-10 Pain Score: 2  Pain Location: R hip Pain Descriptors / Indicators: Sore Pain Intervention(s): Limited activity within patient's tolerance;Monitored during session;Repositioned    Home Living  Family/patient expects to be discharged to:: Private residence Living Arrangements: Spouse/significant other Available Help at Discharge: Family Type of Home: House Home Access: Stairs to enter   [employer] of Steps: 2 steps no railing from garage; 5 steps with B railing front Home Layout: Multi-level;Able to live on main level with bedroom/bathroom Home Equipment: Gilford Rile - 2 wheels;Bedside commode      Prior Function Level of Independence: Independent         Comments: Active; working full time as Web designer.       Hand Dominance        Extremity/Trunk Assessment   Upper Extremity Assessment Upper Extremity Assessment: Overall WFL for tasks assessed    Lower Extremity Assessment Lower Extremity Assessment: RLE deficits/detail;LLE deficits/detail RLE Deficits / Details: at least 3-/5 R hip flexion (limited d/t R hip pain); at least 3/5 knee flexion/extension and DF RLE: Unable to fully assess due to pain LLE Deficits / Details: strength and ROM WFL    Cervical / Trunk Assessment Cervical / Trunk Assessment: Normal  Communication   Communication: No difficulties  Cognition Arousal/Alertness: Awake/alert Behavior During Therapy: WFL for tasks assessed/performed Overall Cognitive Status: Within Functional Limits for tasks assessed                                        General Comments General comments (skin integrity, edema, etc.): R hip dressing intact.  Pt agreeable to PT session.    Exercises Total Joint Exercises Ankle Circles/Pumps: AROM;Strengthening;Both;10 reps;Supine Quad Sets: AROM;Strengthening;Both;10 reps;Supine Short Arc Quad: AROM;Strengthening;Right;10 reps;Supine Heel Slides: AAROM;Strengthening;Right;10 reps;Supine Hip ABduction/ADduction: AAROM;Strengthening;Right;10 reps;Supine   Assessment/Plan    PT Assessment Patient needs continued PT services  PT Problem List Decreased strength;Decreased activity  tolerance;Decreased balance;Decreased mobility;Decreased knowledge of use of DME;Decreased knowledge of precautions;Cardiopulmonary status limiting activity;Pain;Decreased skin integrity       PT Treatment Interventions DME instruction;Gait training;Stair training;Functional mobility training;Therapeutic activities;Therapeutic exercise;Balance training;Patient/family education    PT Goals (Current goals can be found in the Care Plan section)  Acute Rehab PT Goals Patient Stated Goal: to go home PT Goal Formulation: With patient Time For Goal Achievement: 01/24/18 Potential to Achieve Goals: Good    Frequency BID   Barriers to discharge        Co-evaluation               AM-PAC PT "6 Clicks" Daily Activity  Outcome Measure Difficulty turning over in bed (including adjusting bedclothes, sheets and blankets)?: A Little Difficulty moving from lying on back to sitting on the side of the bed? : Unable Difficulty sitting down on and standing up from a chair with arms (e.g., wheelchair, bedside commode, etc,.)?: Unable Help needed moving to and from a bed to chair (including a wheelchair)?: A Little Help needed walking in hospital room?: A Little Help needed climbing 3-5 steps with a railing? : A Lot 6 Click Score: 13    End of Session Equipment Utilized  During Treatment: Gait belt Activity Tolerance: Treatment limited secondary to medical complications (Comment)(decreased BP) Patient left: in bed;Other (comment)(nursing present assisting pt with set-up and nurse reports plan to take BP once pt rests in bed for a bit) Nurse Communication: Mobility status;Precautions;Weight bearing status;Other (comment)(pt's vitals) PT Visit Diagnosis: Other abnormalities of gait and mobility (R26.89);Muscle weakness (generalized) (M62.81);Difficulty in walking, not elsewhere classified (R26.2);Pain Pain - Right/Left: Right Pain - part of body: Hip    Time: 4301-4840 PT Time Calculation (min)  (ACUTE ONLY): 48 min   Charges:   PT Evaluation $PT Eval Low Complexity: 1 Low PT Treatments $Therapeutic Exercise: 8-22 mins $Therapeutic Activity: 8-22 mins       Leitha Bleak, PT 01/10/18, 5:06 PM [phone]

## 2018-01-10 NOTE — Progress Notes (Signed)
This nurse was called in pts room by Raquel Sarna, [place]. Pt had gotten up and walked and gotten into chair. Pt's facial color pale and blood pressure dropped 79/55. Assisted pt back to bed. Pt states she now feels better and BP 90/58. Will continue to monitor.

## 2018-01-10 NOTE — H&P (Signed)
The patient has been re-examined, and the chart reviewed, and there have been no interval changes to the documented history and physical.  Plan a right total hip replacement today.  Anesthesia is not consulted regarding a peripheral nerve block for post-operative pain.  The risks, benefits, and alternatives have been discussed at length, and the patient is willing to proceed.     

## 2018-01-10 NOTE — Anesthesia Post-op Follow-up Note (Signed)
Anesthesia QCDR form completed.        

## 2018-01-10 NOTE — NC FL2 (Signed)
[place] LEVEL OF CARE SCREENING TOOL     IDENTIFICATION  Patient Name: Hannah Gonzalez Birthdate: 02/01/1961 Sex: female Admission Date (Current Location): 01/10/2018  [place] and [place] Number:  Engineering geologist and Address:  [facility], [address] [place], [place] [address]      Provider Number: [id number]  Attending Physician Name and Address:  Lovell Sheehan, MD  Relative Name and Phone Number:       Current Level of Care: Hospital Recommended Level of Care: [place] Prior Approval Number:    Date Approved/Denied:   PASRR Number: [id number])  Discharge Plan: SNF    Current Diagnoses: Patient Active Problem List   Diagnosis Date Noted  . Osteoarthritis of right hip 01/10/2018  . Paroxysmal tachycardia ([place]) 06/21/2017  . Chronic thoracic back pain 06/21/2017  . Chronic pain of right hip 06/21/2017  . Lumbar radiculopathy 07/13/2016  . Pain of both hip joints 07/12/2016  . Avitaminosis D 08/26/2014  . Anxiety, generalized 08/26/2014  . Acid reflux 08/26/2014  . Hypercholesteremia 08/26/2014  . Essential hypertension 08/26/2014  . 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 Tachycardia 05/23/2014  . Mixed hyperlipidemia 05/23/2014  . H/O thymoma 05/23/2014    Orientation RESPIRATION BLADDER Height & Weight     Self, Time, Situation, Place  Normal Continent Weight: 176 lb 11.2 oz (80.2 kg) Height:  5' 7.75" (172.1 cm)  BEHAVIORAL SYMPTOMS/MOOD NEUROLOGICAL BOWEL NUTRITION STATUS      Continent Diet(Diet: Regular )  AMBULATORY STATUS COMMUNICATION OF NEEDS Skin   Extensive Assist Verbally Surgical wounds(Incision: Right Hip. )                       Personal Care Assistance Level of Assistance  Bathing, Feeding, Dressing Bathing Assistance: Limited assistance Feeding assistance: Independent Dressing Assistance:  Limited assistance     Functional Limitations Info  Sight, Hearing, Speech Sight Info: Adequate Hearing Info: Adequate Speech Info: Adequate    SPECIAL CARE FACTORS FREQUENCY  PT (By licensed PT), OT (By licensed OT)     PT Frequency: (5) OT Frequency: (5)            Contractures      Additional Factors Info  Code Status, Allergies Code Status Info: (Full Code. ) Allergies Info: (No Known Allergies. )           Current Medications (01/10/2018):  This is the current hospital active medication list Current Facility-Administered Medications  Medication Dose Route Frequency Provider Last Rate Last Dose  . [START ON 01/11/2018] acetaminophen (TYLENOL) tablet 325-650 mg  325-650 mg Oral Q6H PRN Lovell Sheehan, MD      . acetaminophen (TYLENOL) tablet 500 mg  500 mg Oral Q6H Lovell Sheehan, MD   500 mg at 01/10/18 1147  . aspirin chewable tablet 81 mg  81 mg Oral BID Lovell Sheehan, MD      . bisacodyl (DULCOLAX) suppository 10 mg  10 mg Rectal Daily PRN Lovell Sheehan, MD      . ceFAZolin (ANCEF) IVPB 1 g/50 mL premix  1 g Intravenous Q6H Lovell Sheehan, MD 100 mL/hr at 01/10/18 1417 1 g at 01/10/18 1417  . cholecalciferol (VITAMIN D3) tablet 1,000 Units  1,000 Units Oral Daily Lovell Sheehan, MD   1,000 Units at 01/10/18 1147  . docusate sodium (COLACE) capsule 100 mg  100 mg  Oral BID Lovell Sheehan, MD   100 mg at 01/10/18 1147  . fesoterodine (TOVIAZ) tablet 4 mg  4 mg Oral Daily Lovell Sheehan, MD   4 mg at 01/10/18 1147  . HYDROcodone-acetaminophen (NORCO) 7.5-325 MG per tablet 1-2 tablet  1-2 tablet Oral Q4H PRN Lovell Sheehan, MD   1 tablet at 01/10/18 1420  . HYDROcodone-acetaminophen (NORCO/VICODIN) 5-325 MG per tablet 1-2 tablet  1-2 tablet Oral Q4H PRN Lovell Sheehan, MD      . ketorolac (TORADOL) 15 MG/ML injection 15 mg  15 mg Intravenous Q6H Lovell Sheehan, MD   15 mg at 01/10/18 1148  . lactated ringers infusion   Intravenous Continuous Lovell Sheehan, MD 75 mL/hr at 01/10/18 1135    . [START ON 01/11/2018] loratadine (CLARITIN) tablet 10 mg  10 mg Oral Daily Lovell Sheehan, MD      . magnesium citrate solution 1 Bottle  1 Bottle Oral Once PRN Lovell Sheehan, MD      . magnesium hydroxide (MILK OF MAGNESIA) suspension 30 mL  30 mL Oral Daily PRN Lovell Sheehan, MD      . menthol-cetylpyridinium (CEPACOL) lozenge 3 mg  1 lozenge Oral PRN Lovell Sheehan, MD       Or  . phenol (CHLORASEPTIC) mouth spray 1 spray  1 spray Mouth/Throat PRN Lovell Sheehan, MD      . metoCLOPramide (REGLAN) tablet 5-10 mg  5-10 mg Oral Q8H PRN Lovell Sheehan, MD       Or  . metoCLOPramide (REGLAN) injection 5-10 mg  5-10 mg Intravenous Q8H PRN Lovell Sheehan, MD      . Derrill Memo ON 01/11/2018] metoprolol succinate (TOPROL-XL) 24 hr tablet 50 mg  50 mg Oral Daily Lovell Sheehan, MD      . morphine 2 MG/ML injection 0.5-1 mg  0.5-1 mg Intravenous Q2H PRN Lovell Sheehan, MD      . ondansetron [facility]) tablet 4 mg  4 mg Oral Q6H PRN Lovell Sheehan, MD       Or  . ondansetron [facility]) injection 4 mg  4 mg Intravenous Q6H PRN Lovell Sheehan, MD      . Ospemifene TABS 1 tablet  1 tablet Oral Daily Lovell Sheehan, MD         Discharge Medications: Please see discharge summary for a list of discharge medications.  Relevant Imaging Results:  Relevant Lab Results:   Additional Information (SSN: [id number])  Maylea Soria, Veronia Beets, LCSW

## 2018-01-11 ENCOUNTER — Encounter: Payer: Self-pay | Admitting: Orthopedic Surgery

## 2018-01-11 LAB — BASIC METABOLIC PANEL
Anion gap: 7 (ref 5–15)
BUN: 13 mg/dL (ref 6–20)
CO2: 25 mmol/L (ref 22–32)
Calcium: 8.1 mg/dL — ABNORMAL LOW (ref 8.9–10.3)
Chloride: 109 mmol/L (ref 98–111)
Creatinine, Ser: 0.79 mg/dL (ref 0.44–1.00)
GFR calc Af Amer: >60 mL/min (ref >60)
GLUCOSE: 123 mg/dL — AB (ref 70–99)
Potassium: 3.9 mmol/L (ref 3.5–5.1)
Sodium: 141 mmol/L (ref 135–145)

## 2018-01-11 LAB — CBC
HEMATOCRIT: 33.4 % — AB (ref 36.0–46.0)
Hemoglobin: 10.8 g/dL — ABNORMAL LOW (ref 12.0–15.0)
MCH: 29.8 pg (ref 26.0–34.0)
MCHC: 32.3 g/dL (ref 30.0–36.0)
MCV: 92.3 fL (ref 80.0–100.0)
PLATELETS: 177 10*3/uL (ref 150–400)
RBC: 3.62 MIL/uL — ABNORMAL LOW (ref 3.87–5.11)
RDW: 12.7 % (ref 11.5–15.5)
WBC: 6.8 10*3/uL (ref 4.0–10.5)
nRBC: 0 % (ref 0.0–0.2)

## 2018-01-11 MED ORDER — ASPIRIN 81 MG PO CHEW: 81.0000 mg | CHEWABLE_TABLET | Freq: Two times a day (BID) | ORAL | 0 refills | Status: DC

## 2018-01-11 MED ORDER — HYDROCODONE-ACETAMINOPHEN 5-325 MG PO TABS: 1.0000 | ORAL_TABLET | Freq: Four times a day (QID) | ORAL | 0 refills | Status: DC | PRN

## 2018-01-11 MED ORDER — DOCUSATE SODIUM 100 MG PO CAPS: 100.0000 mg | ORAL_CAPSULE | Freq: Two times a day (BID) | ORAL | 0 refills | Status: DC

## 2018-01-11 NOTE — Progress Notes (Signed)
Physical Therapy Treatment Patient Details Name: Hannah Gonzalez MRN: [id number] DOB: Jul 03, 1960 Today's Date: 01/11/2018    History of Present Illness Pt is a 57 y.o. female s/p R THR 01/10/18 (anterior).  PMH includes htn, basal cell carcinoma L thigh, ACL repair, back surgery.    PT Comments    Orthostatics taken beginning of session and BP/HR demonstrating appropriate response to position changes/activity; end of session BP 126/62 with HR 100 bpm.  Pt requiring min assist semi-supine to sit; CGA to min assist to stand; and CGA to ambulate 120 feet with RW.  Minimal R hip pain during session (3/10 end of session).  Overall pt progressing well with physical therapy and tolerated session well.  Will continue to progress functional mobility and increase ambulation distance per pt tolerance.    Follow Up Recommendations  Outpatient PT(pt has OP PT set-up for Monday)     Equipment Recommendations  Rolling walker with 5" wheels;3in1 (PT)    Recommendations for Other Services       Precautions / Restrictions Precautions Precautions: Anterior Hip;Fall Precaution Booklet Issued: Yes (comment) Restrictions Weight Bearing Restrictions: Yes RLE Weight Bearing: Weight bearing as tolerated    Mobility  Bed Mobility Overal bed mobility: Needs Assistance Bed Mobility: Supine to Sit     Supine to sit: Min assist;HOB elevated     General bed mobility comments: min assist for R LE semi-supine to sit with increased time to perform  Transfers Overall transfer level: Needs assistance Equipment used: Rolling walker (2 wheeled) Transfers: Sit to/from [facility] Sit to Stand: Min assist;Min guard Stand pivot transfers: Min guard       General transfer comment: min assist to initiate stand up to RW from bed but CGA from recliner; CGA stand step turn bed to recliner; initial vc's for UE/LE placement  Ambulation/Gait Ambulation/Gait assistance: Min guard Gait  Distance (Feet): 120 Feet Assistive device: Rolling walker (2 wheeled)   Gait velocity: decreased   General Gait Details: mild decreased stance time R LE; decreased B step length/foot clearance/heelstrike but improved to partial step through gait pattern with increased distance ambulating; vc's for walker use initially; steady with RW use   Stairs             Wheelchair Mobility    Modified Rankin (Stroke Patients Only)       Balance Overall balance assessment: Needs assistance Sitting-balance support: No upper extremity supported;Feet supported Sitting balance-Leahy Scale: Good Sitting balance - Comments: steady sitting reaching within BOS   Standing balance support: Single extremity supported Standing balance-Leahy Scale: Poor Standing balance comment: pt requiring at least single UE support for static standing balance                            Cognition Arousal/Alertness: Awake/alert Behavior During Therapy: WFL for tasks assessed/performed Overall Cognitive Status: Within Functional Limits for tasks assessed                                        Exercises Total Joint Exercises Ankle Circles/Pumps: AROM;Strengthening;Both;10 reps;Supine Quad Sets: AROM;Strengthening;Both;10 reps;Supine Short Arc Quad: AROM;Strengthening;Right;10 reps;Supine Heel Slides: AAROM;Strengthening;Right;10 reps;Supine Hip ABduction/ADduction: AAROM;Strengthening;Right;10 reps;Supine    General Comments   Nursing cleared pt for participation in physical therapy.  Pt agreeable to PT session.      Pertinent Vitals/Pain Pain Assessment: 0-10 Pain Score:  3  Pain Location: R hip Pain Intervention(s): Limited activity within patient's tolerance;Monitored during session;Premedicated before session;Repositioned  Orthostatic vitals: Supine BP 116/47 with HR 97 bpm Sitting BP 121/74 with HR 98 bpm Standing BP at 0 minutes 126/76 with HR 109 bpm Standing BP at  3 minutes 126/79 with HR 102 bpm O2 sats 94% or greater on room air during session.    Home Living                      Prior Function            PT Goals (current goals can now be found in the care plan section) Acute Rehab PT Goals Patient Stated Goal: to go home PT Goal Formulation: With patient Time For Goal Achievement: 01/24/18 Potential to Achieve Goals: Good Progress towards PT goals: Progressing toward goals    Frequency    BID      PT Plan Current plan remains appropriate    Co-evaluation              AM-PAC PT "6 Clicks" Daily Activity  Outcome Measure  Difficulty turning over in bed (including adjusting bedclothes, sheets and blankets)?: A Little Difficulty moving from lying on back to sitting on the side of the bed? : Unable Difficulty sitting down on and standing up from a chair with arms (e.g., wheelchair, bedside commode, etc,.)?: Unable Help needed moving to and from a bed to chair (including a wheelchair)?: A Little Help needed walking in hospital room?: A Little Help needed climbing 3-5 steps with a railing? : A Little 6 Click Score: 14    End of Session Equipment Utilized During Treatment: Gait belt Activity Tolerance: Patient tolerated treatment well Patient left: in chair;with call bell/phone within reach;with chair alarm set;with SCD's reapplied;Other (comment)(B heels elevated via pillow) Nurse Communication: Mobility status;Precautions;Weight bearing status;Other (comment)(Pt's vitals during session) PT Visit Diagnosis: Other abnormalities of gait and mobility (R26.89);Muscle weakness (generalized) (M62.81);Difficulty in walking, not elsewhere classified (R26.2);Pain Pain - Right/Left: Right Pain - part of body: Hip     Time: 1443-1540 PT Time Calculation (min) (ACUTE ONLY): 42 min  Charges:  $Gait Training: 8-22 mins $Therapeutic Exercise: 8-22 mins $Therapeutic Activity: 8-22 mins                    Leitha Bleak,  PT 01/11/18, 2:00 PM [phone]

## 2018-01-11 NOTE — Care Management (Signed)
01/15/18 patient has appointment at [facility] for PT at [facility] and Dec. 4 at 2:10PM with Dr. Harlow Mares.

## 2018-01-11 NOTE — Discharge Instructions (Signed)

## 2018-01-11 NOTE — Anesthesia Postprocedure Evaluation (Signed)
Anesthesia Post Note  Patient: Hannah Gonzalez  Procedure(s) Performed: TOTAL HIP ARTHROPLASTY ANTERIOR APPROACH (Right Hip)  Patient location during evaluation: Nursing Unit Anesthesia Type: Spinal Level of consciousness: awake and alert and oriented Pain management: satisfactory to patient Vital Signs Assessment: post-procedure vital signs reviewed and stable Respiratory status: respiratory function stable Cardiovascular status: stable Postop Assessment: no backache, no headache, spinal receding, no apparent nausea or vomiting, patient able to bend at knees, adequate PO intake and able to ambulate Anesthetic complications: no     Last Vitals:  Vitals:   01/11/18 0418 01/11/18 0756  BP: 123/60 125/68  Pulse: 85 89  Resp: 18 18  Temp: 37.3 C 37.3 C  SpO2: 96% 100%    Last Pain:  Vitals:   01/11/18 0756  TempSrc: Oral  PainSc:                  Blima Singer

## 2018-01-11 NOTE — Progress Notes (Signed)
Clinical Social Worker (CSW) received SNF consult. PT is recommending outpatient PT. RN case manager aware of above. Please reconsult if future social work needs arise. CSW signing off.   Carnell Casamento, LCSW [phone]  

## 2018-01-11 NOTE — Progress Notes (Signed)
Physical Therapy Treatment Patient Details Name: Hannah Gonzalez MRN: [id number] DOB: 12/19/1960 Today's Date: 01/11/2018    History of Present Illness Pt is a 57 y.o. female s/p R THR 01/10/18 (anterior).  PMH includes htn, basal cell carcinoma L thigh, ACL repair, back surgery.    PT Comments    Pt requiring increased time to get in/out of bed and stand from bed (with RW use) on own.  Able to ambulate around nursing loop with RW CGA to SBA.  Overall steady with functional mobility using RW.  Pain R hip 4/10 beginning of session, 4/10 with ambulation, and 3/10 end of session resting in bed.  Plan to attempt stairs tomorrow/next session (pt reports plan to discharge home tomorrow).   Follow Up Recommendations  Outpatient PT(pt has OP PT set up for Monday)     Equipment Recommendations  Rolling walker with 5" wheels;3in1 (PT)    Recommendations for Other Services       Precautions / Restrictions Precautions Precautions: Anterior Hip;Fall Precaution Booklet Issued: Yes (comment) Restrictions Weight Bearing Restrictions: Yes RLE Weight Bearing: Weight bearing as tolerated    Mobility  Bed Mobility Overal bed mobility: Needs Assistance Bed Mobility: Supine to Sit;Sit to Supine     Supine to sit: Supervision;HOB elevated Sit to supine: Supervision;HOB elevated   General bed mobility comments: increased effort and time for pt to perform on own  Transfers Overall transfer level: Needs assistance Equipment used: Rolling walker (2 wheeled) Transfers: Sit to/from Stand Sit to Stand: Supervision Stand pivot transfers: Min guard       General transfer comment: SBA for safety; increased time for pt to problem solve on own; increased effort to stand on own  Ambulation/Gait Ambulation/Gait assistance: Min guard;Supervision Gait Distance (Feet): 200 Feet Assistive device: Rolling walker (2 wheeled)   Gait velocity: decreased   General Gait Details: mild decreased  stance time R LE; partial step through gait pattern; vc's for walker use initially; steady with RW use   Stairs             Wheelchair Mobility    Modified Rankin (Stroke Patients Only)       Balance Overall balance assessment: Needs assistance Sitting-balance support: No upper extremity supported;Feet supported Sitting balance-Leahy Scale: Good Sitting balance - Comments: steady sitting reaching within BOS   Standing balance support: No upper extremity supported Standing balance-Leahy Scale: Good Standing balance comment: steady standing reaching within BOS                            Cognition Arousal/Alertness: Awake/alert Behavior During Therapy: WFL for tasks assessed/performed Overall Cognitive Status: Within Functional Limits for tasks assessed                                        Exercises     General Comments  Pt agreeable to PT session.      Pertinent Vitals/Pain Pain Assessment: 0-10 Pain Score: 3  Pain Location: R hip Pain Descriptors / Indicators: Sore Pain Intervention(s): Limited activity within patient's tolerance;Monitored during session;Repositioned;Patient requesting pain meds-RN notified;RN gave pain meds during session    Home Living                      Prior Function            PT Goals (current  goals can now be found in the care plan section) Acute Rehab PT Goals Patient Stated Goal: to go home PT Goal Formulation: With patient Time For Goal Achievement: 01/24/18 Potential to Achieve Goals: Good Progress towards PT goals: Progressing toward goals    Frequency    BID      PT Plan Current plan remains appropriate    Co-evaluation              AM-PAC PT "6 Clicks" Daily Activity  Outcome Measure  Difficulty turning over in bed (including adjusting bedclothes, sheets and blankets)?: A Little Difficulty moving from lying on back to sitting on the side of the bed? : A  Lot Difficulty sitting down on and standing up from a chair with arms (e.g., wheelchair, bedside commode, etc,.)?: Unable Help needed moving to and from a bed to chair (including a wheelchair)?: A Little Help needed walking in hospital room?: A Little Help needed climbing 3-5 steps with a railing? : A Little 6 Click Score: 15    End of Session Equipment Utilized During Treatment: Gait belt Activity Tolerance: Patient tolerated treatment well Patient left: in bed;with call bell/phone within reach;with bed alarm set;with SCD's reapplied;Other (comment)(B heels elevated via pillow) Nurse Communication: Mobility status;Precautions;Weight bearing status(via white board) PT Visit Diagnosis: Other abnormalities of gait and mobility (R26.89);Muscle weakness (generalized) (M62.81);Difficulty in walking, not elsewhere classified (R26.2);Pain Pain - Right/Left: Right Pain - part of body: Hip     Time: [id number] PT Time Calculation (min) (ACUTE ONLY): 38 min  Charges:  $Gait Training: 8-22 mins $Therapeutic Exercise: 8-22 mins $Therapeutic Activity: 8-22 mins                     Leitha Bleak, PT 01/11/18, 3:54 PM [phone]

## 2018-01-11 NOTE — Care Management (Signed)
RNCM spoke with patient and she is aware of outpatient plan for PT. She states she plans to return to home with her husband and he will be able to provide transportation to appointments.  No RNCM needs.

## 2018-01-11 NOTE — Discharge Summary (Signed)
Physician Discharge Summary  Patient ID: Hannah Gonzalez MRN: [id number] DOB/AGE: April 24, 1960 57 y.o.  Admit date: 01/10/2018 Discharge date: 01/12/2018  Admission Diagnoses:  osteoarthritis of right hip <principal problem not specified>  Discharge Diagnoses:  osteoarthritis of right hip Active Problems:   Osteoarthritis of right hip   Past Medical History:  Diagnosis Date  . Arthritis   . Basal cell carcinoma    Basil cell left thigh  . Palpitations   . Vitamin D deficiency     Surgeries: Procedure(s): TOTAL HIP ARTHROPLASTY ANTERIOR APPROACH on 01/10/2018   Consultants (if any):   Discharged Condition: Improved  Hospital Course: Hannah Gonzalez is an 57 y.o. female who was admitted 01/10/2018 with a diagnosis of  osteoarthritis of right hip <principal problem not specified> and went to the operating room on 01/10/2018 and underwent the above named procedures.    She was given perioperative antibiotics:  Anti-infectives (From admission, onward)   Start     Dose/Rate Route Frequency Ordered Stop   01/10/18 1400  ceFAZolin (ANCEF) IVPB 1 g/50 mL premix     1 g 100 mL/hr over 30 Minutes Intravenous Every 6 hours 01/10/18 1112 01/10/18 2123   01/10/18 0828  50,000 units bacitracin in 0.9% normal saline 250 mL irrigation  Status:  Discontinued       As needed 01/10/18 0829 01/10/18 0938   01/10/18 0605  ceFAZolin (ANCEF) 2-4 GM/100ML-% IVPB    Note to Pharmacy:  Myles Lipps   : cabinet override      01/10/18 0605 01/10/18 0742   01/10/18 0600  ceFAZolin (ANCEF) IVPB 2g/100 mL premix     2 g 200 mL/hr over 30 Minutes Intravenous On call to O.R. 01/09/18 2238 01/10/18 9767    .  She was given sequential compression devices, early ambulation, and aspirin for DVT prophylaxis.  She benefited maximally from the hospital stay and there were no complications.    Recent vital signs:  Vitals:   01/11/18 1602 01/11/18 2301  BP: 131/75 121/65  Pulse: (!) 101 (!)  108  Resp: 18 19  Temp: 98.7 F (37.1 C) 99.8 F (37.7 C)  SpO2: 100% 94%    Recent laboratory studies:  Lab Results  Component Value Date   HGB 11.0 (L) 01/12/2018   HGB 10.8 (L) 01/11/2018   HGB 13.3 12/29/2017   Lab Results  Component Value Date   WBC 8.0 01/12/2018   PLT 173 01/12/2018   Lab Results  Component Value Date   INR 0.94 12/29/2017   Lab Results  Component Value Date   NA 141 01/11/2018   K 3.9 01/11/2018   CL 109 01/11/2018   CO2 25 01/11/2018   BUN 13 01/11/2018   CREATININE 0.79 01/11/2018   GLUCOSE 123 (H) 01/11/2018    Discharge Medications:   Allergies as of 01/12/2018   No Known Allergies     Medication List    STOP taking these medications   ibuprofen 200 MG tablet Commonly known as:  ADVIL,MOTRIN     TAKE these medications   Apple Cider Vinegar 600 MG Caps Take 600 mg by mouth daily.   aspirin 81 MG chewable tablet Chew 1 tablet (81 mg total) by mouth 2 (two) times daily.   BLUE-EMU MAXIMUM STRENGTH EX Apply 1 application topically 2 (two) times daily.   Cetirizine HCl 10 MG Caps Take 10 mg by mouth daily.   CRANBERRY PO Take 1 capsule by mouth daily.   docusate sodium 100  MG capsule Commonly known as:  COLACE Take 1 capsule (100 mg total) by mouth 2 (two) times daily.   Fish Oil 1200 MG Caps Take 1,200 mg by mouth every other day. Alternating days with flaxseed oil   Flaxseed Misc Take 1,000 mg by mouth every other day. Alternating days with the fish oil   HYDROcodone-acetaminophen 5-325 MG tablet Commonly known as:  NORCO/VICODIN Take 1-2 tablets by mouth every 6 (six) hours as needed for moderate pain (pain score 4-6).   metoprolol succinate 50 MG 24 hr tablet Commonly known as:  TOPROL-XL TAKE 1 TABLET BY MOUTH  DAILY WITH OR IMMEDIATLEY  FOLLOWING A MEAL What changed:  See the new instructions.   ONE-A-DAY WOMENS 50 PLUS PO Take 1 tablet by mouth daily.   Ospemifene 60 MG Tabs Take 1 tablet by mouth  daily. What changed:  how much to take   SALONPAS EX Apply 1 patch topically daily as needed (for pain).   tolterodine 4 MG 24 hr capsule Commonly known as:  DETROL LA TAKE 1 CAPSULE BY MOUTH  DAILY   Vitamin D3 25 MCG (1000 UT) Caps Take 1,000 Units by mouth daily.       Diagnostic Studies: Dg Pelvis Portable  Result Date: 01/10/2018 CLINICAL DATA:  Status post right total hip replacement. EXAM: PORTABLE PELVIS 1-2 VIEWS COMPARISON:  Radiographs of September 29, 2011. FINDINGS: The femoral and acetabular components appear to be well situated. No fracture or dislocation is noted. Expected postoperative changes are noted in the surrounding soft tissues. IMPRESSION: Status post right total hip arthroplasty. Electronically Signed   By: Marijo Conception, M.D.   On: 01/10/2018 10:05   Dg Hip Operative Unilat W Or W/o Pelvis Right  Result Date: 01/10/2018 CLINICAL DATA:  Status post right hip arthroplasty. EXAM: OPERATIVE right HIP (WITH PELVIS IF PERFORMED) 4 VIEWS TECHNIQUE: Fluoroscopic spot image(s) were submitted for interpretation post-operatively. Radiation exposure index: 3.16 mGy. COMPARISON:  Radiographs of September 29, 2011. FINDINGS: Four intraoperative fluoroscopic images of the right hip were obtained. These demonstrate the patient be status post right hip arthroplasty. The femoral and acetabular components appear to be well situated. IMPRESSION: Status post right hip arthroplasty. Electronically Signed   By: Marijo Conception, M.D.   On: 01/10/2018 09:48    Disposition: Discharge disposition: 01-Home or Self Care         Follow-up Information    Lovell Sheehan, MD. Go on 01/15/2018.   Specialty:  Orthopedic Surgery Why:  at [facility] for PT at [facility] and Dec. 4 at 2:10PM with Dr. Harlow Mares. Contact information: [place] [address] [phone]            Signed: Lovell Sheehan ,MD 01/12/2018, 7:19 AM

## 2018-01-11 NOTE — Progress Notes (Signed)
  Subjective:  Patient reports pain as moderate.  Had low blood pressure with standing yesterday.  Objective:   VITALS:   Vitals:   01/10/18 2112 01/10/18 2346 01/11/18 0418 01/11/18 0756  BP: (!) 122/59 (!) 117/58 123/60 125/68  Pulse: (!) 105 (!) 103 85 89  Resp: 17 18 18 18   Temp: 99.2 F (37.3 C) 99.8 F (37.7 C) 99.1 F (37.3 C) 99.1 F (37.3 C)  TempSrc: Oral Oral Oral Oral  SpO2: 96% 95% 96% 100%  Weight:      Height:        PHYSICAL EXAM:  ABD soft Neurovascular intact Sensation intact distally Dorsiflexion/Plantar flexion intact Incision: dressing C/D/I Compartment soft  LABS  Results for orders placed or performed during the hospital encounter of 01/10/18 (from the past 24 hour(s))  CBC     Status: Abnormal   Collection Time: 01/11/18  3:43 AM  Result Value Ref Range   WBC 6.8 4.0 - 10.5 K/uL   RBC 3.62 (L) 3.87 - 5.11 MIL/uL   Hemoglobin 10.8 (L) 12.0 - 15.0 g/dL   HCT 33.4 (L) 36.0 - 46.0 %   MCV 92.3 80.0 - 100.0 fL   MCH 29.8 26.0 - 34.0 pg   MCHC 32.3 30.0 - 36.0 g/dL   RDW 12.7 11.5 - 15.5 %   Platelets 177 150 - 400 K/uL   nRBC 0.0 0.0 - 0.2 %  Basic metabolic panel     Status: Abnormal   Collection Time: 01/11/18  3:43 AM  Result Value Ref Range   Sodium 141 135 - 145 mmol/L   Potassium 3.9 3.5 - 5.1 mmol/L   Chloride 109 98 - 111 mmol/L   CO2 25 22 - 32 mmol/L   Glucose, Bld 123 (H) 70 - 99 mg/dL   BUN 13 6 - 20 mg/dL   Creatinine, Ser 0.79 0.44 - 1.00 mg/dL   Calcium 8.1 (L) 8.9 - 10.3 mg/dL   GFR calc non Af Amer >60 >60 mL/min   GFR calc Af Amer >60 >60 mL/min   Anion gap 7 5 - 15    Dg Pelvis Portable  Result Date: 01/10/2018 CLINICAL DATA:  Status post right total hip replacement. EXAM: PORTABLE PELVIS 1-2 VIEWS COMPARISON:  Radiographs of September 29, 2011. FINDINGS: The femoral and acetabular components appear to be well situated. No fracture or dislocation is noted. Expected postoperative changes are noted in the surrounding  soft tissues. IMPRESSION: Status post right total hip arthroplasty. Electronically Signed   By: Marijo Conception, M.D.   On: 01/10/2018 10:05   Dg Hip Operative Unilat W Or W/o Pelvis Right  Result Date: 01/10/2018 CLINICAL DATA:  Status post right hip arthroplasty. EXAM: OPERATIVE right HIP (WITH PELVIS IF PERFORMED) 4 VIEWS TECHNIQUE: Fluoroscopic spot image(s) were submitted for interpretation post-operatively. Radiation exposure index: 3.16 mGy. COMPARISON:  Radiographs of September 29, 2011. FINDINGS: Four intraoperative fluoroscopic images of the right hip were obtained. These demonstrate the patient be status post right hip arthroplasty. The femoral and acetabular components appear to be well situated. IMPRESSION: Status post right hip arthroplasty. Electronically Signed   By: Marijo Conception, M.D.   On: 01/10/2018 09:48    Assessment/Plan: 1 Day Post-Op   Active Problems:   Osteoarthritis of right hip   Up with therapy Plan for discharge tomorrow   Lovell Sheehan , MD 01/11/2018, 8:27 AM

## 2018-01-12 LAB — CBC
HCT: 33.6 % — ABNORMAL LOW (ref 36.0–46.0)
HEMOGLOBIN: 11 g/dL — AB (ref 12.0–15.0)
MCH: 30.1 pg (ref 26.0–34.0)
MCHC: 32.7 g/dL (ref 30.0–36.0)
MCV: 92.1 fL (ref 80.0–100.0)
PLATELETS: 173 10*3/uL (ref 150–400)
RBC: 3.65 MIL/uL — AB (ref 3.87–5.11)
RDW: 12.8 % (ref 11.5–15.5)
WBC: 8 10*3/uL (ref 4.0–10.5)
nRBC: 0 % (ref 0.0–0.2)

## 2018-01-12 LAB — SURGICAL PATHOLOGY

## 2018-01-12 NOTE — Progress Notes (Signed)
Physical Therapy Treatment Patient Details Name: Hannah Gonzalez MRN: [id number] DOB: 09-24-1960 Today's Date: 01/12/2018    History of Present Illness Pt is a 57 y.o. female s/p R THR 01/10/18 (anterior).  PMH includes htn, basal cell carcinoma L thigh, ACL repair, back surgery.    PT Comments    Pt able to ambulate around nursing station with RW and navigate 4 stairs with RW safely.  Steady with functional mobility using RW during session.  2/10 R hip pain during session's activities.  Educated pt and pt's husband on safe car transfers (both verbalizing understanding).  Pt reporting no further questions/concerns regarding PT for home discharge.  Plan to discharge home today.    Follow Up Recommendations  Outpatient PT(pt has OP PT set up for Monday)     Equipment Recommendations  Rolling walker with 5" wheels;3in1 (PT)    Recommendations for Other Services       Precautions / Restrictions Precautions Precautions: Anterior Hip;Fall Precaution Booklet Issued: Yes (comment) Restrictions Weight Bearing Restrictions: Yes RLE Weight Bearing: Weight bearing as tolerated    Mobility  Bed Mobility Overal bed mobility: Needs Assistance Bed Mobility: Supine to Sit     Supine to sit: Modified independent (Device/Increase time)     General bed mobility comments: increased effort and time for pt to perform on own  Transfers Overall transfer level: Needs assistance Equipment used: Rolling walker (2 wheeled) Transfers: Sit to/from Stand Sit to Stand: Supervision Stand pivot transfers: Supervision       General transfer comment: steady strong transfers  Ambulation/Gait Ambulation/Gait assistance: Supervision Gait Distance (Feet): (100 feet; 180 feet) Assistive device: Rolling walker (2 wheeled)   Gait velocity: decreased   General Gait Details: mild decreased stance time R LE; partial step through gait pattern; steady with RW use   Stairs Stairs: Yes Stairs  assistance: Min guard Stair Management: Step to pattern;Backwards;Forwards;With walker Number of Stairs: 4 General stair comments: pt ascended 4 stairs with RW backwards and descended 4 stairs with RW forwards; initial vc's for technique and then pt able to perform on own safely   Wheelchair Mobility    Modified Rankin (Stroke Patients Only)       Balance Overall balance assessment: Needs assistance Sitting-balance support: No upper extremity supported;Feet supported Sitting balance-Leahy Scale: Normal Sitting balance - Comments: steady sitting reaching outside BOS   Standing balance support: No upper extremity supported Standing balance-Leahy Scale: Good Standing balance comment: steady standing reaching within BOS                            Cognition Arousal/Alertness: Awake/alert Behavior During Therapy: WFL for tasks assessed/performed Overall Cognitive Status: Within Functional Limits for tasks assessed                                        Exercises Total Joint Exercises Long Arc Quad: AROM;Strengthening;Both;10 reps;Seated General Exercises - Lower Extremity Hip Flexion/Marching: AROM;Left;AAROM;Right;Strengthening;10 reps;Seated    General Comments  Pt agreeable to PT session.  Pt's home walker adjusted for appropriate fit for home use.      Pertinent Vitals/Pain      Home Living                      Prior Function            PT Goals (  current goals can now be found in the care plan section) Acute Rehab PT Goals Patient Stated Goal: to go home PT Goal Formulation: With patient Time For Goal Achievement: 01/24/18 Potential to Achieve Goals: Good Progress towards PT goals: Progressing toward goals    Frequency    BID      PT Plan Current plan remains appropriate    Co-evaluation              AM-PAC PT "6 Clicks" Daily Activity  Outcome Measure  Difficulty turning over in bed (including adjusting  bedclothes, sheets and blankets)?: A Little Difficulty moving from lying on back to sitting on the side of the bed? : A Lot Difficulty sitting down on and standing up from a chair with arms (e.g., wheelchair, bedside commode, etc,.)?: A Little Help needed moving to and from a bed to chair (including a wheelchair)?: A Little Help needed walking in hospital room?: A Little Help needed climbing 3-5 steps with a railing? : A Little 6 Click Score: 17    End of Session Equipment Utilized During Treatment: Gait belt Activity Tolerance: Patient tolerated treatment well Patient left: in chair;with call bell/phone within reach;with chair alarm set;with family/visitor present Nurse Communication: Mobility status;Precautions;Weight bearing status PT Visit Diagnosis: Other abnormalities of gait and mobility (R26.89);Muscle weakness (generalized) (M62.81);Difficulty in walking, not elsewhere classified (R26.2);Pain Pain - Right/Left: Right Pain - part of body: Hip     Time: 4827-0786 PT Time Calculation (min) (ACUTE ONLY): 38 min  Charges:  $Gait Training: 8-22 mins $Therapeutic Exercise: 8-22 mins $Therapeutic Activity: 8-22 mins                    Leitha Bleak, PT 01/12/18, 2:56 PM [phone]

## 2018-01-12 NOTE — Care Management (Signed)
For discharge today. Outpatient therapy appointments have been set up and patient aware of date and time

## 2018-01-12 NOTE — Progress Notes (Signed)
  Subjective:  Patient reports pain as mild.  Doing well with therapy  Objective:   VITALS:   Vitals:   01/11/18 0756 01/11/18 1145 01/11/18 1602 01/11/18 2301  BP: 125/68 126/82 131/75 121/65  Pulse: 89  (!) 101 (!) 108  Resp: 18  18 19   Temp: 99.1 F (37.3 C)  98.7 F (37.1 C) 99.8 F (37.7 C)  TempSrc: Oral  Oral Oral  SpO2: 100%  100% 94%  Weight:      Height:        PHYSICAL EXAM:  Neurologically intact ABD soft Neurovascular intact Sensation intact distally Intact pulses distally Dorsiflexion/Plantar flexion intact Incision: no drainage  LABS  Results for orders placed or performed during the hospital encounter of 01/10/18 (from the past 24 hour(s))  CBC     Status: Abnormal   Collection Time: 01/12/18  4:17 AM  Result Value Ref Range   WBC 8.0 4.0 - 10.5 K/uL   RBC 3.65 (L) 3.87 - 5.11 MIL/uL   Hemoglobin 11.0 (L) 12.0 - 15.0 g/dL   HCT 33.6 (L) 36.0 - 46.0 %   MCV 92.1 80.0 - 100.0 fL   MCH 30.1 26.0 - 34.0 pg   MCHC 32.7 30.0 - 36.0 g/dL   RDW 12.8 11.5 - 15.5 %   Platelets 173 150 - 400 K/uL   nRBC 0.0 0.0 - 0.2 %    Dg Pelvis Portable  Result Date: 01/10/2018 CLINICAL DATA:  Status post right total hip replacement. EXAM: PORTABLE PELVIS 1-2 VIEWS COMPARISON:  Radiographs of September 29, 2011. FINDINGS: The femoral and acetabular components appear to be well situated. No fracture or dislocation is noted. Expected postoperative changes are noted in the surrounding soft tissues. IMPRESSION: Status post right total hip arthroplasty. Electronically Signed   By: Marijo Conception, M.D.   On: 01/10/2018 10:05   Dg Hip Operative Unilat W Or W/o Pelvis Right  Result Date: 01/10/2018 CLINICAL DATA:  Status post right hip arthroplasty. EXAM: OPERATIVE right HIP (WITH PELVIS IF PERFORMED) 4 VIEWS TECHNIQUE: Fluoroscopic spot image(s) were submitted for interpretation post-operatively. Radiation exposure index: 3.16 mGy. COMPARISON:  Radiographs of September 29, 2011.  FINDINGS: Four intraoperative fluoroscopic images of the right hip were obtained. These demonstrate the patient be status post right hip arthroplasty. The femoral and acetabular components appear to be well situated. IMPRESSION: Status post right hip arthroplasty. Electronically Signed   By: Marijo Conception, M.D.   On: 01/10/2018 09:48    Assessment/Plan: 2 Days Post-Op   Active Problems:   Osteoarthritis of right hip   Advance diet Up with therapy  Home today after therapy   Carlynn Spry , MD 01/12/2018, 6:35 AM

## 2018-01-23 ENCOUNTER — Emergency Department
Admission: EM | Admit: 2018-01-23 | Discharge: 2018-01-23 | Disposition: A | Discharge status: Home / Self Care | Payer: Managed Care, Other (non HMO) | Attending: Emergency Medicine | Admitting: Emergency Medicine

## 2018-01-23 ENCOUNTER — Encounter: Payer: Self-pay | Admitting: Emergency Medicine

## 2018-01-23 ENCOUNTER — Other Ambulatory Visit: Payer: Self-pay

## 2018-01-23 DIAGNOSIS — T7840XA Allergy, unspecified, initial encounter: Secondary | ICD-10-CM | POA: Diagnosis not present

## 2018-01-23 DIAGNOSIS — Z79899 Other long term (current) drug therapy: Secondary | ICD-10-CM | POA: Insufficient documentation

## 2018-01-23 DIAGNOSIS — I10 Essential (primary) hypertension: Secondary | ICD-10-CM | POA: Insufficient documentation

## 2018-01-23 DIAGNOSIS — L509 Urticaria, unspecified: Secondary | ICD-10-CM | POA: Diagnosis present

## 2018-01-23 DIAGNOSIS — Z7982 Long term (current) use of aspirin: Secondary | ICD-10-CM | POA: Insufficient documentation

## 2018-01-23 DIAGNOSIS — R600 Localized edema: Secondary | ICD-10-CM | POA: Diagnosis not present

## 2018-01-23 LAB — CBC
HCT: 39.3 % (ref 36.0–46.0)
Hemoglobin: 13 g/dL (ref 12.0–15.0)
MCH: 30.2 pg (ref 26.0–34.0)
MCHC: 33.1 g/dL (ref 30.0–36.0)
MCV: 91.4 fL (ref 80.0–100.0)
PLATELETS: 392 10*3/uL (ref 150–400)
RBC: 4.3 MIL/uL (ref 3.87–5.11)
RDW: 13.3 % (ref 11.5–15.5)
WBC: 12.8 10*3/uL — ABNORMAL HIGH (ref 4.0–10.5)
nRBC: 0 % (ref 0.0–0.2)

## 2018-01-23 LAB — COMPREHENSIVE METABOLIC PANEL
ALT: 34 U/L (ref 0–44)
ANION GAP: 11 (ref 5–15)
AST: 37 U/L (ref 15–41)
Albumin: 3.7 g/dL (ref 3.5–5.0)
Alkaline Phosphatase: 64 U/L (ref 38–126)
BUN: 23 mg/dL — ABNORMAL HIGH (ref 6–20)
CHLORIDE: 107 mmol/L (ref 98–111)
CO2: 24 mmol/L (ref 22–32)
Calcium: 8.8 mg/dL — ABNORMAL LOW (ref 8.9–10.3)
Creatinine, Ser: 0.89 mg/dL (ref 0.44–1.00)
GFR calc non Af Amer: >60 mL/min (ref >60)
Glucose, Bld: 118 mg/dL — ABNORMAL HIGH (ref 70–99)
Potassium: 4.2 mmol/L (ref 3.5–5.1)
Sodium: 142 mmol/L (ref 135–145)
Total Bilirubin: 0.6 mg/dL (ref 0.3–1.2)
Total Protein: 6.6 g/dL (ref 6.5–8.1)

## 2018-01-23 MED ORDER — DIPHENHYDRAMINE HCL 50 MG/ML IJ SOLN
50.0000 mg | Freq: Once | INTRAMUSCULAR | Status: AC
  Administered 2018-01-23: 50 mg via INTRAVENOUS
  Filled 2018-01-23: qty 1

## 2018-01-23 MED ORDER — FAMOTIDINE IN NACL 20-0.9 MG/50ML-% IV SOLN
20.0000 mg | Freq: Once | INTRAVENOUS | Status: AC
  Administered 2018-01-23: 20 mg via INTRAVENOUS
  Filled 2018-01-23: qty 50

## 2018-01-23 MED ORDER — EPINEPHRINE 0.3 MG/0.3ML IJ SOAJ: 0.3000 mg | Freq: Once | INTRAMUSCULAR | 1 refills | Status: AC

## 2018-01-23 MED ORDER — METHYLPREDNISOLONE SODIUM SUCC 125 MG IJ SOLR
125.0000 mg | Freq: Once | INTRAMUSCULAR | Status: AC
  Administered 2018-01-23: 125 mg via INTRAVENOUS
  Filled 2018-01-23: qty 2

## 2018-01-23 MED ORDER — PREDNISONE 50 MG PO TABS: ORAL_TABLET | ORAL | 0 refills | Status: DC

## 2018-01-23 NOTE — ED Provider Notes (Signed)
Patient is in no distress, appears to be improving and she states she feels better.   Earleen Newport, MD 01/23/18 0930

## 2018-01-23 NOTE — ED Notes (Signed)
Pt had hip surgery on 01/10/18

## 2018-01-23 NOTE — ED Triage Notes (Addendum)
Pt to triage via w/c, blanching & hives to face noted; steroid inj received at [facility] on Sunday for allergic rx; rx prednisone but told not to start for 48hrs; latex bandaid in place from recent hip replacement; has started taking tylenol recently which is new but otherwise no hx of any allergies

## 2018-01-23 NOTE — ED Provider Notes (Signed)
[facility] Emergency Department Provider Note  Time seen:5:40 AM  I have reviewed the triage vital signs and the nursing notes.   HISTORY  Chief Complaint Allergic Reaction    HPI Hannah Gonzalez is a 57 y.o. female [place] chronic medical conditions including recent right hip replacement secondary dysplasia and allergic reaction which the patient was seen in urgent care on Sunday. Patient presents to the emergency department today secondary to recurrence of allergic reaction with generalized hives and periorbital swelling.patient denies any difficulty breathing or swallowing. Patient states that she was prescribed prednisone by urgent care but advised not to started for 48 hours.   Past Medical History:  Diagnosis Date  . Arthritis   . Basal cell carcinoma    Basil cell left thigh  . Palpitations   . Vitamin D deficiency     Patient Active Problem List   Diagnosis Date Noted  . Osteoarthritis of right hip 01/10/2018  . Paroxysmal tachycardia ([place]) 06/21/2017  . Chronic thoracic back pain 06/21/2017  . Chronic pain of right hip 06/21/2017  . Lumbar radiculopathy 07/13/2016  . Pain of both hip joints 07/12/2016  . Avitaminosis D 08/26/2014  . Anxiety, generalized 08/26/2014  . Acid reflux 08/26/2014  . Hypercholesteremia 08/26/2014  . Essential hypertension 08/26/2014  . 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 Tachycardia 05/23/2014  . Mixed hyperlipidemia 05/23/2014  . 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 ANTERIOR CRUCIATE LIGAMENT REPAIR    . ARTHROSCOPIC REPAIR ACL     left  . BACK SURGERY    . BIOPSY BREAST     left   . BREAST BIOPSY Left    benign  . LAPAROSCOPY    . MYOMECTOMY    . PAROTIDECTOMY    . THYMECTOMY    . TONSILLECTOMY    . TOTAL HIP ARTHROPLASTY Right 01/10/2018   Procedure: TOTAL HIP ARTHROPLASTY  ANTERIOR APPROACH;  Surgeon: Lovell Sheehan, MD;  Location: [facility] ORS;  Service: Orthopedics;  Laterality: Right;    Prior to Admission medications   Medication Sig Start Date End Date Taking? Authorizing Provider  Apple Cider Vinegar 600 MG CAPS Take 600 mg by mouth daily.    [provider]  aspirin 81 MG chewable tablet Chew 1 tablet (81 mg total) by mouth 2 (two) times daily. 01/11/18   Lovell Sheehan, MD  Cetirizine HCl 10 MG CAPS Take 10 mg by mouth daily.     [provider]  Cholecalciferol (VITAMIN D3) 1000 units CAPS Take 1,000 Units by mouth daily.     [provider]  CRANBERRY PO Take 1 capsule by mouth daily.    [provider]  docusate sodium (COLACE) 100 MG capsule Take 1 capsule (100 mg total) by mouth 2 (two) times daily. 01/11/18   Lovell Sheehan, MD  Flaxseed MISC Take 1,000 mg by mouth every other day. Alternating days with the fish oil    [provider]  HYDROcodone-acetaminophen (NORCO/VICODIN) 5-325 MG tablet Take 1-2 tablets by mouth every 6 (six) hours as needed for moderate pain (pain score 4-6). 01/11/18   Lovell Sheehan, MD  Liniments [facility] EX) Apply 1 patch topically daily as needed (for pain).    [provider]  Menthol, Topical Analgesic, (BLUE-EMU MAXIMUM STRENGTH EX) Apply 1 application topically 2 (two) times daily.    [provider]  metoprolol succinate (TOPROL-XL) 50 MG 24 hr tablet TAKE 1 TABLET BY MOUTH  DAILY WITH OR IMMEDIATLEY  FOLLOWING A MEAL Patient taking differently: Take 50 mg by mouth daily.  08/29/17   [name]., MD  Multiple Vitamins-Minerals (ONE-A-DAY WOMENS 50 PLUS PO) Take 1 tablet by mouth daily.    [provider]  Omega-3 Fatty Acids (FISH OIL) 1200 MG CAPS Take 1,200 mg by mouth every other day. Alternating days with flaxseed oil    [provider]  Ospemifene (OSPHENA) 60 MG TABS Take 1 tablet by mouth daily. Patient taking  differently: Take 60 mg by mouth daily.  04/07/17   Schuman, Christanna R, MD  tolterodine (DETROL LA) 4 MG 24 hr capsule TAKE 1 CAPSULE BY MOUTH  DAILY Patient taking differently: Take 4 mg by mouth daily.  12/11/17   [name]., MD    Allergies no known drug allergies  Family History  Problem Relation Age of Onset  . Hypertension Mother   . Hyperlipidemia Mother   . Heart disease Mother   . Heart failure Mother   . Arrhythmia Mother        A-Fib  . Arthritis Mother   . Cancer Mother   . Hypertension Brother   . Hyperlipidemia Brother   . Arthritis Father   . Hypertension Father   . Prostate cancer Father   . Breast cancer Cousin   . Cancer Maternal Aunt   . Breast cancer Maternal Aunt     Social History Social History   Tobacco Use  . Smoking status: Never Smoker  . Smokeless tobacco: Never Used  Substance Use Topics  . Alcohol use: No  . Drug use: No    Review of Systems Constitutional: No fever/chills Eyes: No visual changes. ENT: No sore throat. Cardiovascular: Denies chest pain. Respiratory: Denies shortness of breath. Gastrointestinal: No abdominal pain.  No nausea, no vomiting.  No diarrhea.  No constipation. Genitourinary: Negative for dysuria. Musculoskeletal: Negative for neck pain.  Negative for back pain. Integumentary: Negative for rash. Neurological: Negative for headaches, focal weakness or numbness.   ____________________________________________   PHYSICAL EXAM:  VITAL SIGNS: ED Triage Vitals  Enc Vitals Group     BP 01/23/18 0530 (!) 150/81     Pulse Rate 01/23/18 0530 (!) 109     Resp 01/23/18 0530 20     Temp 01/23/18 0530 98.7 F (37.1 C)     Temp Source 01/23/18 0530 Oral     SpO2 01/23/18 0530 97 %     Weight 01/23/18 0527 80.2 kg (176 lb 12.9 oz)     Height 01/23/18 0527 1.702 m (5\' 7" )     Head Circumference --      Peak Flow --      Pain Score 01/23/18 0527 0     Pain Loc --      Pain Edu? --      Excl. in  [place]? --     Constitutional: Alert and oriented. Well appearing and in no acute distress. Eyes: Conjunctivae are normal. Mouth/Throat: Mucous membranes are moist.  Oropharynx non-erythematous. Neck: No stridor. Cardiovascular: Normal rate, regular rhythm. Good peripheral circulation. Grossly normal heart sounds. Respiratory: Normal respiratory effort.  No retractions. Lungs CTAB. Gastrointestinal: Soft and nontender. No distention.  Musculoskeletal: No lower extremity tenderness nor edema. No gross deformities of extremities. Neurologic:  Normal speech and language. No gross focal neurologic deficits are appreciated.  Skin:  Skin is warm, dry and  intact. No rash noted. Psychiatric: Mood and affect are normal. Speech and behavior are normal.  ____________________________________________   LABS (all labs ordered are listed, but only abnormal results are displayed)  Labs Reviewed  CBC - Abnormal; Notable for the following components:      Result Value   WBC 12.8 (*)    All other components within normal limits  COMPREHENSIVE METABOLIC PANEL - Abnormal; Notable for the following components:   Glucose, Bld 118 (*)    BUN 23 (*)    Calcium 8.8 (*)    All other components within normal limits   _____   INITIAL IMPRESSION / ASSESSMENT AND PLAN / ED COURSE  As part of my medical decision making, I reviewed the following data within the electronic MEDICAL RECORD NUMBER   57 year old female presenting with above stated history of physical exam consistent with allergic reaction. Patient given Benadryl 50 mg side maternal aunt 25 mg and Pepcid 20 mg IV. on reevaluation patient's hives markedly improved. Patient with no difficulty breathing or swallowing. Patient will be prescribed EpiPen for home. Patient will be referred to Dr.Juengel allergies are further outpatient evaluation ____________________________________________  FINAL CLINICAL IMPRESSION(S) / ED DIAGNOSES  Final diagnoses:    Allergic reaction, initial encounter     MEDICATIONS GIVEN DURING THIS VISIT:  Medications  famotidine (PEPCID) IVPB 20 mg premix (20 mg Intravenous New Bag/Given 01/23/18 0559)  diphenhydrAMINE (BENADRYL) injection 50 mg (50 mg Intravenous Given 01/23/18 0554)  methylPREDNISolone sodium succinate (SOLU-MEDROL) 125 mg/2 mL injection 125 mg (125 mg Intravenous Given 01/23/18 0556)     ED Discharge Orders    None       Note:  This document was prepared using Dragon voice recognition software and may include unintentional dictation errors.    Gregor Hams, MD 01/23/18 [phone]

## 2018-03-27 ENCOUNTER — Other Ambulatory Visit: Payer: Self-pay | Admitting: Family Medicine

## 2018-03-27 ENCOUNTER — Encounter: Payer: Self-pay | Admitting: Family Medicine

## 2018-03-27 ENCOUNTER — Ambulatory Visit (INDEPENDENT_AMBULATORY_CARE_PROVIDER_SITE_OTHER): Payer: Managed Care, Other (non HMO) | Admitting: Family Medicine

## 2018-03-27 VITALS — BP 122/82 | HR 102 | Temp 98.6°F | Ht 68.0 in | Wt 180.2 lb

## 2018-03-27 DIAGNOSIS — Z1231 Encounter for screening mammogram for malignant neoplasm of breast: Secondary | ICD-10-CM

## 2018-03-27 DIAGNOSIS — I10 Essential (primary) hypertension: Secondary | ICD-10-CM

## 2018-03-27 DIAGNOSIS — Z0000 Encounter for general adult medical examination without abnormal findings: Secondary | ICD-10-CM | POA: Diagnosis not present

## 2018-03-27 DIAGNOSIS — E7800 Pure hypercholesterolemia, unspecified: Secondary | ICD-10-CM

## 2018-03-27 DIAGNOSIS — Z114 Encounter for screening for human immunodeficiency virus [HIV]: Secondary | ICD-10-CM

## 2018-03-27 NOTE — Progress Notes (Signed)
Patient: Hannah Gonzalez, Female    DOB: [birth year], 58 y.o.   MRN: [id number] Visit Date: 03/27/2018  Today's Provider: [name], MD   Chief Complaint  Patient presents with  . Annual Exam   Subjective:     Annual physical exam Hannah Gonzalez is a 58 y.o. female who presents today for health maintenance and complete physical. She feels well. She reports exercising physical therapy. She reports she is sleeping well. Patient allergic to aspirin , avoid aspirin and nonsteroidals and has an EpiPen available. She has done very well since her total hip replacement and feels much better since having this done.  She is getting back to some regular activities.  ----------------------------------------------------------------- Last Mammogram: 04/17/2017 Last Colonoscopy: 05/19/2015 Last pap: 02/19/2014  Review of Systems  Constitutional: Positive for activity change (Due to surgery).  HENT: Negative.   Eyes: Negative.   Respiratory: Negative.   Cardiovascular: Negative.   Gastrointestinal: Negative.   Endocrine: Negative.   Genitourinary: Positive for urgency.  Musculoskeletal: Negative.   Skin: Negative.   Allergic/Immunologic: Negative.   Neurological: Negative.   Hematological: Negative.   Psychiatric/Behavioral: Negative.     Social History      She  reports that she has never smoked. She has never used smokeless tobacco. She reports that she does not drink alcohol or use drugs.       Social History   Socioeconomic History  . Marital status: Married    Spouse name: Shanon Brow  . Number of children: 0  . Years of education: associates  . Highest education level: Not on file  Occupational History    Employer: [employer]  . Financial resource strain: Not on file  . Food insecurity:    Worry: Not on file    Inability: Not on file  . Transportation needs:    Medical: Not on file    Non-medical: Not on file  Tobacco Use  . Smoking  status: Never Smoker  . Smokeless tobacco: Never Used  Substance and Sexual Activity  . Alcohol use: No  . Drug use: No  . Sexual activity: Yes    Birth control/protection: Post-menopausal  Lifestyle  . Physical activity:    Days per week: 0 days    Minutes per session: 0 min  . Stress: Only a little  Relationships  . Social connections:    Talks on phone: Not on file    Gets together: Not on file    Attends religious service: Not on file    Active member of club or organization: Not on file    Attends meetings of clubs or organizations: Not on file    Relationship status: Not on file  Other Topics Concern  . Not on file  Social History Narrative  . Not on file    Past Medical History:  Diagnosis Date  . Arthritis   . Basal cell carcinoma    Basil cell left thigh  . Palpitations   . Vitamin D deficiency      Patient Active Problem List   Diagnosis Date Noted  . Osteoarthritis of right hip 01/10/2018  . Paroxysmal tachycardia ([place]) 06/21/2017  . Chronic thoracic back pain 06/21/2017  . Chronic pain of right hip 06/21/2017  . Lumbar radiculopathy 07/13/2016  . Pain of both hip joints 07/12/2016  . Avitaminosis D 08/26/2014  . Anxiety, generalized 08/26/2014  . Acid reflux 08/26/2014  . Hypercholesteremia 08/26/2014  . Essential hypertension  08/26/2014  . 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 Tachycardia 05/23/2014  . Mixed hyperlipidemia 05/23/2014  . 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 ANTERIOR CRUCIATE LIGAMENT REPAIR    . ARTHROSCOPIC REPAIR ACL     left  . BACK SURGERY    . BIOPSY BREAST     left   . BREAST BIOPSY Left    benign  . LAPAROSCOPY    . MYOMECTOMY    . PAROTIDECTOMY    . THYMECTOMY    . TONSILLECTOMY    . TOTAL HIP ARTHROPLASTY Right 01/10/2018   Procedure: TOTAL HIP ARTHROPLASTY ANTERIOR APPROACH;  Surgeon: Lovell Sheehan,  MD;  Location: [facility] ORS;  Service: Orthopedics;  Laterality: Right;    Family History        Family Status  Relation Name Status  . Mother  Alive  . Brother  Alive  . Father  Deceased       pacemaker implant  . Cousin maternal Alive  . Mat Aunt  (Not Specified)        Her family history includes Arrhythmia in her mother; Arthritis in her father and mother; Breast cancer in her cousin and maternal aunt; Cancer in her maternal aunt and mother; Heart disease in her mother; Heart failure in her mother; Hyperlipidemia in her brother and mother; Hypertension in her brother, father, and mother; Prostate cancer in her father.      No Known Allergies   Current Outpatient Medications:  .  Cetirizine HCl 10 MG CAPS, Take 10 mg by mouth daily. , Disp: , Rfl:  .  Cholecalciferol (VITAMIN D3) 1000 units CAPS, Take 1,000 Units by mouth daily. , Disp: , Rfl:  .  CRANBERRY PO, Take 1 capsule by mouth daily., Disp: , Rfl:  .  Flaxseed MISC, Take 1,000 mg by mouth every other day. Alternating days with the fish oil, Disp: , Rfl:  .  metoprolol succinate (TOPROL-XL) 50 MG 24 hr tablet, TAKE 1 TABLET BY MOUTH  DAILY WITH OR IMMEDIATLEY  FOLLOWING A MEAL (Patient taking differently: Take 50 mg by mouth daily. ), Disp: 90 tablet, Rfl: 3 .  Multiple Vitamins-Minerals (ONE-A-DAY WOMENS 50 PLUS PO), Take 1 tablet by mouth daily., Disp: , Rfl:  .  Omega-3 Fatty Acids (FISH OIL) 1200 MG CAPS, Take 1,200 mg by mouth every other day. Alternating days with flaxseed oil, Disp: , Rfl:  .  Ospemifene (OSPHENA) 60 MG TABS, Take 1 tablet by mouth daily. (Patient taking differently: Take 60 mg by mouth daily. ), Disp: 30 tablet, Rfl: 11 .  tolterodine (DETROL LA) 4 MG 24 hr capsule, TAKE 1 CAPSULE BY MOUTH  DAILY (Patient taking differently: Take 4 mg by mouth daily. ), Disp: 90 capsule, Rfl: 3 .  Apple Cider Vinegar 600 MG CAPS, Take 600 mg by mouth daily., Disp: , Rfl:  .  aspirin 81 MG chewable tablet, Chew 1 tablet  (81 mg total) by mouth 2 (two) times daily. (Patient not taking: Reported on 03/27/2018), Disp: 60 tablet, Rfl: 0 .  docusate sodium (COLACE) 100 MG capsule, Take 1 capsule (100 mg total) by mouth 2 (two) times daily. (Patient not taking: Reported on 03/27/2018), Disp: 10 capsule, Rfl: 0 .  HYDROcodone-acetaminophen (NORCO/VICODIN) 5-325 MG tablet, Take 1-2 tablets by mouth every 6 (six) hours as needed for moderate pain (pain score 4-6). (Patient not taking: Reported on 03/27/2018),  Disp: 20 tablet, Rfl: 0 .  Liniments (SALONPAS EX), Apply 1 patch topically daily as needed (for pain)., Disp: , Rfl:  .  Menthol, Topical Analgesic, (BLUE-EMU MAXIMUM STRENGTH EX), Apply 1 application topically 2 (two) times daily., Disp: , Rfl:  .  predniSONE (DELTASONE) 50 MG tablet, Take 1 tablet by mouth daily (Patient not taking: Reported on 03/27/2018), Disp: 5 tablet, Rfl: 0   Patient Care Team: [name]., MD as PCP - General (Family Medicine) Rockey Situ, Kathlene November, MD as Consulting Physician (Cardiology)    Objective:    Vitals: BP 122/82 (BP Location: Left Arm, Patient Position: Sitting, Cuff Size: Normal)   Pulse (!) 102   Temp 98.6 F (37 C) (Oral)   Ht 5\' 8"  (1.727 m)   Wt 180 lb 3.2 oz (81.7 kg)   SpO2 95%   BMI 27.40 kg/m    Vitals:   03/27/18 0915  BP: 122/82  Pulse: (!) 102  Temp: 98.6 F (37 C)  TempSrc: Oral  SpO2: 95%  Weight: 180 lb 3.2 oz (81.7 kg)  Height: 5\' 8"  (1.727 m)     Physical Exam Constitutional:      Appearance: Normal appearance. She is well-developed.  HENT:     Head: Normocephalic and atraumatic.     Right Ear: External ear normal.     Left Ear: External ear normal.     Nose: Nose normal.  Eyes:     General: No scleral icterus.    Conjunctiva/sclera: Conjunctivae normal.  Neck:     Thyroid: No thyromegaly.  Cardiovascular:     Rate and Rhythm: Normal rate and regular rhythm.     Heart sounds: Normal heart sounds.  Pulmonary:     Effort: Pulmonary  effort is normal.     Breath sounds: Normal breath sounds.  Abdominal:     Palpations: Abdomen is soft.  Genitourinary:    Vagina: Normal. No vaginal discharge.     Rectum: Guaiac result negative.  Lymphadenopathy:     Cervical: No cervical adenopathy.  Skin:    General: Skin is warm and dry.     Comments: Fair skin.  Neurological:     General: No focal deficit present.     Mental Status: She is alert and oriented to person, place, and time. Mental status is at baseline.  Psychiatric:        Mood and Affect: Mood normal.        Behavior: Behavior normal.        Thought Content: Thought content normal.        Judgment: Judgment normal.      Depression Screen PHQ 2/9 Scores 03/27/2018 03/23/2017 03/22/2016 03/04/2015  PHQ - 2 Score 0 0 0 0  PHQ- 9 Score 0 0 - -       Assessment & Plan:     Routine Health Maintenance and Physical Exam  Exercise Activities and Dietary recommendations Goals   None     Immunization History  Administered Date(s) Administered  . Influenza Split 11/22/2015  . Influenza Whole 11/21/2016  . Influenza-Unspecified 12/03/2014  . Td 09/21/2015  . Tdap 08/30/2005    Health Maintenance  Topic Date Due  . HIV Screening  04/13/1974  . PAP SMEAR-Modifier  02/19/2017  . INFLUENZA VACCINE  09/21/2017  . MAMMOGRAM  04/18/2019  . COLONOSCOPY  05/18/2025  . TETANUS/TDAP  09/20/2025  . Hepatitis C Screening  Completed     Discussed health benefits of physical activity, and encouraged  her to engage in regular exercise appropriate for her age and condition.  1. Annual physical exam  - TSH - Lipid Profile - Comprehensive Metabolic Panel (CMET) - CBC with Differential - HIV Antibody (routine testing w rflx)  2. Essential hypertension  - TSH - Comprehensive Metabolic Panel (CMET) - CBC with Differential  3. Hypercholesteremia  - TSH - Lipid Profile  4. Encounter for screening for HIV  - TSH - HIV Antibody (routine testing w  rflx)    --------------------------------------------------------------------  I, Porsha McClurkin, CMA, am acting as a scribe for [name], MD.    [name], MD  [facility]

## 2018-04-07 ENCOUNTER — Other Ambulatory Visit: Payer: Self-pay | Admitting: Obstetrics and Gynecology

## 2018-04-07 DIAGNOSIS — N941 Unspecified dyspareunia: Secondary | ICD-10-CM

## 2018-04-18 ENCOUNTER — Ambulatory Visit
Admission: RE | Admit: 2018-04-18 | Discharge: 2018-04-18 | Disposition: A | Discharge status: Home / Self Care | Payer: Managed Care, Other (non HMO) | Source: Ambulatory Visit | Attending: Family Medicine | Admitting: Family Medicine

## 2018-04-18 DIAGNOSIS — Z1231 Encounter for screening mammogram for malignant neoplasm of breast: Secondary | ICD-10-CM | POA: Diagnosis not present

## 2018-04-27 LAB — CBC WITH DIFFERENTIAL/PLATELET
Basophils Absolute: 0 10*3/uL (ref 0.0–0.2)
Basos: 0 %
EOS (ABSOLUTE): 0.1 10*3/uL (ref 0.0–0.4)
Eos: 2 %
Hematocrit: 40.8 % (ref 34.0–46.6)
Hemoglobin: 13.6 g/dL (ref 11.1–15.9)
Immature Grans (Abs): 0 10*3/uL (ref 0.0–0.1)
Immature Granulocytes: 0 %
Lymphocytes Absolute: 2.5 10*3/uL (ref 0.7–3.1)
Lymphs: 49 %
MCH: 29.7 pg (ref 26.6–33.0)
MCHC: 33.3 g/dL (ref 31.5–35.7)
MCV: 89 fL (ref 79–97)
Monocytes Absolute: 0.4 10*3/uL (ref 0.1–0.9)
Monocytes: 8 %
Neutrophils Absolute: 2.1 10*3/uL (ref 1.4–7.0)
Neutrophils: 41 %
Platelets: 233 10*3/uL (ref 150–450)
RBC: 4.58 x10E6/uL (ref 3.77–5.28)
RDW: 13 % (ref 11.7–15.4)
WBC: 5.1 10*3/uL (ref 3.4–10.8)

## 2018-04-27 LAB — LIPID PANEL
Chol/HDL Ratio: 5.1 ratio — ABNORMAL HIGH (ref 0.0–4.4)
Cholesterol, Total: 244 mg/dL — ABNORMAL HIGH (ref 100–199)
HDL: 48 mg/dL (ref >39)
LDL Calculated: 157 mg/dL — ABNORMAL HIGH (ref 0–99)
Triglycerides: 194 mg/dL — ABNORMAL HIGH (ref 0–149)
VLDL Cholesterol Cal: 39 mg/dL (ref 5–40)

## 2018-04-27 LAB — COMPREHENSIVE METABOLIC PANEL
ALBUMIN: 4.4 g/dL (ref 3.8–4.9)
ALT: 19 IU/L (ref 0–32)
AST: 24 IU/L (ref 0–40)
Albumin/Globulin Ratio: 2 (ref 1.2–2.2)
Alkaline Phosphatase: 55 IU/L (ref 39–117)
BUN / CREAT RATIO: 15 (ref 9–23)
BUN: 11 mg/dL (ref 6–24)
Bilirubin Total: 0.4 mg/dL (ref 0.0–1.2)
CALCIUM: 9.4 mg/dL (ref 8.7–10.2)
CO2: 23 mmol/L (ref 20–29)
CREATININE: 0.75 mg/dL (ref 0.57–1.00)
Chloride: 103 mmol/L (ref 96–106)
GFR, EST AFRICAN AMERICAN: 101 mL/min/{1.73_m2} (ref >59)
GFR, EST NON AFRICAN AMERICAN: 88 mL/min/{1.73_m2} (ref >59)
GLOBULIN, TOTAL: 2.2 g/dL (ref 1.5–4.5)
GLUCOSE: 96 mg/dL (ref 65–99)
Potassium: 4.7 mmol/L (ref 3.5–5.2)
SODIUM: 143 mmol/L (ref 134–144)
TOTAL PROTEIN: 6.6 g/dL (ref 6.0–8.5)

## 2018-04-27 LAB — HIV ANTIBODY (ROUTINE TESTING W REFLEX): HIV Screen 4th Generation wRfx: NONREACTIVE

## 2018-04-27 LAB — TSH: TSH: 3.36 u[IU]/mL (ref 0.450–4.500)

## 2018-05-01 ENCOUNTER — Telehealth: Payer: Self-pay

## 2018-05-01 NOTE — Telephone Encounter (Signed)
LMTCB

## 2018-05-01 NOTE — Telephone Encounter (Signed)
-----   Message from [name]., MD sent at 04/30/2018  1:53 PM EDT ----- Labs stable.

## 2018-05-02 NOTE — Telephone Encounter (Signed)
Left message to call back  

## 2018-05-03 NOTE — Telephone Encounter (Signed)
LMTCB 05/03/2018   Thanks,   -Hannah Gonzalez  

## 2018-05-07 NOTE — Telephone Encounter (Signed)
Unable to contact the patient. Letter mailed.  

## 2018-06-20 ENCOUNTER — Ambulatory Visit: Payer: Managed Care, Other (non HMO) | Admitting: Obstetrics and Gynecology

## 2018-07-13 ENCOUNTER — Ambulatory Visit: Payer: Managed Care, Other (non HMO) | Admitting: Obstetrics and Gynecology

## 2018-07-17 ENCOUNTER — Ambulatory Visit (INDEPENDENT_AMBULATORY_CARE_PROVIDER_SITE_OTHER): Payer: Managed Care, Other (non HMO) | Admitting: Obstetrics and Gynecology

## 2018-07-17 ENCOUNTER — Other Ambulatory Visit: Payer: Self-pay

## 2018-07-17 ENCOUNTER — Encounter: Payer: Self-pay | Admitting: Obstetrics and Gynecology

## 2018-07-17 VITALS — BP 132/80 | HR 110 | Ht 68.0 in | Wt 179.0 lb

## 2018-07-17 DIAGNOSIS — Z01419 Encounter for gynecological examination (general) (routine) without abnormal findings: Secondary | ICD-10-CM | POA: Diagnosis not present

## 2018-07-17 DIAGNOSIS — Z124 Encounter for screening for malignant neoplasm of cervix: Secondary | ICD-10-CM

## 2018-07-17 NOTE — Progress Notes (Signed)
Gynecology Annual Exam  PCP: [name]., MD  Chief Complaint:  Chief Complaint  Patient presents with  . Gynecologic Exam    History of Present Illness:Patient is a 58 y.o. G0P0000 presents for annual exam. The patient has no complaints today.   LMP: No LMP recorded. Patient is postmenopausal. Menarche:not applicable Clots: no Intermenstrual Bleeding: no Postcoital Bleeding: no  The patient is not currently sexually active. The patient does perform self breast exams.  There is notable family history of breast or ovarian cancer in her family.  The patient wears seatbelts: yes.   The patient has regular exercise: no.    The patient denies current symptoms of depression.     Patient started osphena a year ago. She has taken it on and off. She has not yet been sexually active with her husband, but she would like to resume sexual activities in the future. She feels like her vaginal pain and discomfort has improved.   She notes occasional hot flashes. She denies sleep disturbances.   She reports that in an 8 hour work day she will urinate about 3 times. Denies night time awakenings.   "When I get the urge, I need to go right then." She denies cough, laugh sneeze incontinence.    Review of Systems: Review of Systems  Constitutional: Negative for chills, fever, malaise/fatigue and weight loss.  HENT: Negative for congestion, hearing loss and sinus pain.   Eyes: Negative for blurred vision and double vision.  Respiratory: Negative for cough, sputum production, shortness of breath and wheezing.   Cardiovascular: Negative for chest pain, palpitations, orthopnea and leg swelling.  Gastrointestinal: Negative for abdominal pain, constipation, diarrhea, nausea and vomiting.  Genitourinary: Positive for urgency. Negative for dysuria, flank pain, frequency and hematuria.  Musculoskeletal: Negative for back pain, falls and joint pain.  Skin: Negative for itching and rash.   Neurological: Negative for dizziness and headaches.  Psychiatric/Behavioral: Negative for depression, substance abuse and suicidal ideas. The patient is not nervous/anxious.     Past Medical History:  Past Medical History:  Diagnosis Date  . Arthritis   . Basal cell carcinoma 05/04/2001   Basal cell left thigh  . Hx of basal cell carcinoma 05/04/2001   L sup. ant. thigh  . Hx of dysplastic nevus    multiple sites  . Palpitations   . Vitamin D deficiency     Past Surgical History:  Past Surgical History:  Procedure Laterality Date  . ANTERIOR CRUCIATE LIGAMENT REPAIR    . ARTHROSCOPIC REPAIR ACL     left  . BACK SURGERY    . BIOPSY BREAST     left   . BREAST BIOPSY Left    benign  . LAPAROSCOPY    . MYOMECTOMY    . PAROTIDECTOMY    . THYMECTOMY    . TONSILLECTOMY    . TOTAL HIP ARTHROPLASTY Right 01/10/2018   Procedure: TOTAL HIP ARTHROPLASTY ANTERIOR APPROACH;  Surgeon: Lovell Sheehan, MD;  Location: [facility] ORS;  Service: Orthopedics;  Laterality: Right;    Gynecologic History:  No LMP recorded. Patient is postmenopausal. Last Pap: Results were: Uncertain Last mammogram: 04/18/2018 Results were: BI-RAD I  Obstetric History: G0P0000  Family History:  Family History  Problem Relation Age of Onset  . Hypertension Mother   . Hyperlipidemia Mother   . Heart disease Mother   . Heart failure Mother   . Arrhythmia Mother        A-Fib  .  Arthritis Mother   . Cancer Mother   . Hypertension Brother   . Hyperlipidemia Brother   . Arthritis Father   . Hypertension Father   . Prostate cancer Father   . Breast cancer Cousin   . Cancer Maternal Aunt   . Breast cancer Maternal Aunt     Social History:  Social History   Socioeconomic History  . Marital status: Married    Spouse name: Shanon Brow  . Number of children: 0  . Years of education: associates  . Highest education level: Not on file  Occupational History    Employer: [employer]  . Financial  resource strain: Not on file  . Food insecurity:    Worry: Not on file    Inability: Not on file  . Transportation needs:    Medical: Not on file    Non-medical: Not on file  Tobacco Use  . Smoking status: Never Smoker  . Smokeless tobacco: Never Used  Substance and Sexual Activity  . Alcohol use: No  . Drug use: No  . Sexual activity: Not Currently    Birth control/protection: Post-menopausal  Lifestyle  . Physical activity:    Days per week: 0 days    Minutes per session: 0 min  . Stress: Only a little  Relationships  . Social connections:    Talks on phone: Not on file    Gets together: Not on file    Attends religious service: Not on file    Active member of club or organization: Not on file    Attends meetings of clubs or organizations: Not on file    Relationship status: Not on file  . Intimate partner violence:    Fear of current or ex partner: Not on file    Emotionally abused: Not on file    Physically abused: Not on file    Forced sexual activity: Not on file  Other Topics Concern  . Not on file  Social History Narrative  . Not on file    Allergies:  Allergies  Allergen Reactions  . Aspirin Hives and Rash    Medications: Prior to Admission medications   Medication Sig Start Date End Date Taking? Authorizing Provider  Cetirizine HCl 10 MG CAPS Take 10 mg by mouth daily.    Yes [provider]  Cholecalciferol (VITAMIN D3) 1000 units CAPS Take 1,000 Units by mouth daily.    Yes [provider]  CRANBERRY PO Take 1 capsule by mouth daily.   Yes [provider]  Flaxseed MISC Take 1,000 mg by mouth every other day. Alternating days with the fish oil   Yes [provider]  Menthol, Topical Analgesic, (BLUE-EMU MAXIMUM STRENGTH EX) Apply 1 application topically 2 (two) times daily.   Yes [provider]  metoprolol succinate (TOPROL-XL) 50 MG 24 hr tablet TAKE 1 TABLET BY MOUTH  DAILY WITH OR IMMEDIATLEY  FOLLOWING  A MEAL Patient taking differently: Take 50 mg by mouth daily.  08/29/17  Yes [name]., MD  Multiple Vitamins-Minerals (ONE-A-DAY WOMENS 50 PLUS PO) Take 1 tablet by mouth daily.   Yes [provider]  Omega-3 Fatty Acids (FISH OIL) 1200 MG CAPS Take 1,200 mg by mouth every other day. Alternating days with flaxseed oil   Yes [provider]  OSPHENA 60 MG TABS TAKE 1 TABLET BY MOUTH DAILY 04/09/18  Yes ,  R, MD  tolterodine (DETROL LA) 4 MG 24 hr capsule TAKE 1 CAPSULE  BY MOUTH  DAILY Patient taking differently: Take 4 mg by mouth daily.  12/11/17  Yes [name]., MD    Physical Exam Vitals: Blood pressure 132/80, pulse (!) 110, height 5\' 8"  (1.727 m), weight 179 lb (81.2 kg).  General: NAD HEENT: normocephalic, anicteric Thyroid: no enlargement, no palpable nodules Pulmonary: No increased work of breathing, CTAB Cardiovascular: RRR, distal pulses 2+ Breast: Breast symmetrical, no tenderness, no palpable nodules or masses, no skin or nipple retraction present, no nipple discharge.  No axillary or supraclavicular lymphadenopathy. Abdomen: NABS, soft, non-tender, non-distended.  Umbilicus without lesions.  No hepatomegaly, splenomegaly or masses palpable. No evidence of hernia  Genitourinary:  External: Normal external female genitalia.  Normal urethral meatus, normal Bartholin's and Skene's glands.    Vagina: Normal vaginal mucosa, no evidence of prolapse.  - improved vaginal appearance, narrow but able to tolerate 2 finger bimanual examination. Patient notes marked improvement in pain tolerance.   Cervix: Grossly normal in appearance, no bleeding  Uterus: Non-enlarged, mobile, normal contour.  No CMT  Adnexa: ovaries non-enlarged, no adnexal masses  Rectal: deferred  Lymphatic: no evidence of inguinal lymphadenopathy Extremities: no edema, erythema, or tenderness Neurologic: Grossly intact Psychiatric: mood appropriate, affect full   Female chaperone present for pelvic and breast  portions of the physical exam     Assessment: 58 y.o. G0P0000 routine annual exam  Plan: Problem List Items Addressed This Visit    None    Visit Diagnoses    Encounter for well woman exam    -  Primary   Cervical cancer screening       Relevant Orders   PapIG, HPV, rfx 16/18      1) Mammogram - recommend yearly screening mammogram.  Mammogram Is up to date  2) STI screening  was not offered and therefore not obtained  3) ASCCP guidelines and rational discussed.  Patient opts for every 3 years screening interval  4) Vaginal dryness and vaginal stenosis- improved with osphena- continue medication. Patient understands risks of thromboembolism and decreased bone density.  5) Routine healthcare maintenance including cholesterol, diabetes screening discussed managed by PCP  6) Colonoscopy is up to date.  Screening recommended starting at age 58 for average risk individuals, age 55 for individuals deemed at increased risk (including African Americans) and recommended to continue until age 26.  For patient age 62-85 individualized approach is recommended.  Gold standard screening is via colonoscopy, Cologuard screening is an acceptable alternative for patient unwilling or unable to undergo colonoscopy.  "Colorectal cancer screening for average?risk adults: 2018 guideline update from the American Cancer Society"CA: A Cancer Journal for Clinicians: Jul 20, 2016   7) Return in about 1 year (around 07/17/2019) for annual.   Adrian Prows MD [place] 07/17/2018 9:20 AM

## 2018-07-26 LAB — PAPIG, HPV, RFX 16/18: HPV, high-risk: NEGATIVE

## 2018-07-27 NOTE — Progress Notes (Signed)
Called patient 07/27/2018, no answer, need repeat pap smear in 2-4 months

## 2018-07-30 NOTE — Progress Notes (Signed)
Called 07/30/2018, no answer, left message

## 2018-07-31 ENCOUNTER — Telehealth: Payer: Self-pay | Admitting: Obstetrics and Gynecology

## 2018-07-31 NOTE — Telephone Encounter (Signed)
Patient is calling for labs results. Please advise. 

## 2018-08-01 NOTE — Telephone Encounter (Signed)
Patient is returning missed call from Dr. Gilman Schmidt. Patient aware Dr. Gilman Schmidt out of office will return to morrow. Please advise [phone]

## 2018-08-10 ENCOUNTER — Other Ambulatory Visit: Payer: Self-pay

## 2018-08-10 ENCOUNTER — Ambulatory Visit (INDEPENDENT_AMBULATORY_CARE_PROVIDER_SITE_OTHER): Payer: Managed Care, Other (non HMO) | Admitting: Obstetrics and Gynecology

## 2018-08-10 ENCOUNTER — Encounter: Payer: Self-pay | Admitting: Obstetrics and Gynecology

## 2018-08-10 VITALS — BP 130/72 | HR 111 | Ht 68.0 in | Wt 176.0 lb

## 2018-08-10 DIAGNOSIS — Z124 Encounter for screening for malignant neoplasm of cervix: Secondary | ICD-10-CM

## 2018-08-10 NOTE — Progress Notes (Signed)
Patient ID: Hannah Gonzalez, female   DOB: Nov 30, 1960, 58 y.o.   MRN: [id number]  Reason for Consult: Gynecologic Exam (repeat pap)   Referred by [name].,*  Subjective:     HPI:  Hannah Gonzalez is a 58 y.o. female . She presents today for a repeat pap smear because she had insufficient cells on her prior pap.   Past Medical History:  Diagnosis Date  . Arthritis   . Basal cell carcinoma 05/04/2001   Basal cell left thigh  . Hx of basal cell carcinoma 05/04/2001   L sup. ant. thigh  . Hx of dysplastic nevus    multiple sites  . Palpitations   . Vitamin D deficiency    Family History  Problem Relation Age of Onset  . Hypertension Mother   . Hyperlipidemia Mother   . Heart disease Mother   . Heart failure Mother   . Arrhythmia Mother        A-Fib  . Arthritis Mother   . Cancer Mother   . Hypertension Brother   . Hyperlipidemia Brother   . Arthritis Father   . Hypertension Father   . Prostate cancer Father   . Breast cancer Cousin   . Cancer Maternal Aunt   . Breast cancer Maternal Aunt    Past Surgical History:  Procedure Laterality Date  . ANTERIOR CRUCIATE LIGAMENT REPAIR    . ARTHROSCOPIC REPAIR ACL     left  . BACK SURGERY    . BIOPSY BREAST     left   . BREAST BIOPSY Left    benign  . LAPAROSCOPY    . MYOMECTOMY    . PAROTIDECTOMY    . THYMECTOMY    . TONSILLECTOMY    . TOTAL HIP ARTHROPLASTY Right 01/10/2018   Procedure: TOTAL HIP ARTHROPLASTY ANTERIOR APPROACH;  Surgeon: Lovell Sheehan, MD;  Location: [facility] ORS;  Service: Orthopedics;  Laterality: Right;    Short Social History:  Social History   Tobacco Use  . Smoking status: Never Smoker  . Smokeless tobacco: Never Used  Substance Use Topics  . Alcohol use: No    Allergies  Allergen Reactions  . Aspirin Hives and Rash    Current Outpatient Medications  Medication Sig Dispense Refill  . Cetirizine HCl 10 MG CAPS Take 10 mg by mouth daily.     .  Cholecalciferol (VITAMIN D3) 1000 units CAPS Take 1,000 Units by mouth daily.     Marland Kitchen CRANBERRY PO Take 1 capsule by mouth daily.    . Flaxseed MISC Take 1,000 mg by mouth every other day. Alternating days with the fish oil    . Menthol, Topical Analgesic, (BLUE-EMU MAXIMUM STRENGTH EX) Apply 1 application topically 2 (two) times daily.    . metoprolol succinate (TOPROL-XL) 50 MG 24 hr tablet TAKE 1 TABLET BY MOUTH  DAILY WITH OR IMMEDIATLEY  FOLLOWING A MEAL (Patient taking differently: Take 50 mg by mouth daily. ) 90 tablet 3  . Multiple Vitamins-Minerals (ONE-A-DAY WOMENS 50 PLUS PO) Take 1 tablet by mouth daily.    . Omega-3 Fatty Acids (FISH OIL) 1200 MG CAPS Take 1,200 mg by mouth every other day. Alternating days with flaxseed oil    . OSPHENA 60 MG TABS TAKE 1 TABLET BY MOUTH DAILY 30 tablet 11  . tolterodine (DETROL LA) 4 MG 24 hr capsule TAKE 1 CAPSULE BY MOUTH  DAILY (Patient taking differently: Take 4 mg by mouth daily. ) 90 capsule 3  No current facility-administered medications for this visit.     Review of Systems  Constitutional: Negative for chills, fatigue, fever and unexpected weight change.  HENT: Negative for trouble swallowing.  Eyes: Negative for loss of vision.  Respiratory: Negative for cough, shortness of breath and wheezing.  Cardiovascular: Negative for chest pain, leg swelling, palpitations and syncope.  GI: Negative for abdominal pain, blood in stool, diarrhea, nausea and vomiting.  GU: Negative for difficulty urinating, dysuria, frequency and hematuria.  Musculoskeletal: Negative for back pain, leg pain and joint pain.  Skin: Negative for rash.  Neurological: Negative for dizziness, headaches, light-headedness, numbness and seizures.  Psychiatric: Negative for behavioral problem, confusion, depressed mood and sleep disturbance.        Objective:  Objective   Vitals:   08/10/18 0810  BP: 130/72  Pulse: (!) 111  Weight: 176 lb (79.8 kg)  Height: 5\' 8"   (1.727 m)   Body mass index is 26.76 kg/m.  Physical Exam Vitals signs and nursing note reviewed. Exam conducted with a chaperone present.  Constitutional:      Appearance: She is well-developed.  HENT:     Head: Normocephalic and atraumatic.  Eyes:     Pupils: Pupils are equal, round, and reactive to light.  Cardiovascular:     Rate and Rhythm: Normal rate and regular rhythm.  Pulmonary:     Effort: Pulmonary effort is normal. No respiratory distress.  Genitourinary:    General: Normal vulva.     Comments: Narrow vaginal introitus, grossly normal cervix Skin:    General: Skin is warm and dry.  Neurological:     Mental Status: She is alert and oriented to person, place, and time.  Psychiatric:        Behavior: Behavior normal.        Thought Content: Thought content normal.        Judgment: Judgment normal.        Assessment/Plan:     58 yo here for repeat pap smear secondary to insufficient cells.  Pap smear collected and sent  More than 10 minutes were spent face to face with the patient in the room with more than 50% of the time spent providing counseling and discussing the plan of management.    Adrian Prows MD [facility] 08/10/2018 8:30 AM

## 2018-08-23 ENCOUNTER — Other Ambulatory Visit: Payer: Self-pay | Admitting: Obstetrics and Gynecology

## 2018-08-23 DIAGNOSIS — Z780 Asymptomatic menopausal state: Secondary | ICD-10-CM

## 2018-08-23 LAB — PAP IG AND HPV HIGH-RISK: HPV, high-risk: NEGATIVE

## 2018-08-23 NOTE — Progress Notes (Signed)
Called and discussed lab with patient.

## 2018-08-27 ENCOUNTER — Other Ambulatory Visit: Payer: Self-pay | Admitting: Obstetrics and Gynecology

## 2018-08-27 DIAGNOSIS — Z780 Asymptomatic menopausal state: Secondary | ICD-10-CM

## 2018-08-28 ENCOUNTER — Telehealth: Payer: Self-pay | Admitting: Obstetrics and Gynecology

## 2018-08-28 NOTE — Telephone Encounter (Signed)
Called and left voicemail for patient to call back to confirm schedule appointment

## 2018-08-28 NOTE — Telephone Encounter (Signed)
Called and left generic message for patient to call back. Patient has been schedule for Dexa scan at [facility] on Thursday, 10/04/18 at 9:30 if this doesn't fit for patient contact [place] at [phone]

## 2018-08-28 NOTE — Telephone Encounter (Signed)
Per [place] no Calium products the morning of the scan

## 2018-08-29 NOTE — Telephone Encounter (Signed)
Called and left voice mail for patient to call back to be schedule °

## 2018-08-29 NOTE — Telephone Encounter (Signed)
Patient is aware of schedule Dexa Scan

## 2018-09-17 NOTE — Progress Notes (Signed)
Cardiology Office Note  Date:  09/18/2018   ID:  Hannah, Gonzalez September 07, 1960, MRN [id number]  PCP:  [name]., MD   Chief Complaint  Patient presents with  . Other    12 month follow up. Patient denies chest paina nd SOB. Meds reviewed verbally with patient.     HPI:  [name] is a 58 year old woman with history of  Paroxysmal tachycardia, arrhythmia dating back to when she was young, and proved on metoprolol Hyperlipidemia Family hx of high cholesterol, mom is 59 yo who presents for evaluation of her tachycardia.  And hyperlipidemia  In general reports that she is feeling much better Had back surgery Had hip replacement Dr.Bowen at [facility] Pain has resolved  Denies any tachycardia on metoprolol Tolerating 50 mg daily  exercising more, watching her diet Long discussion concerning high lipids  Long discussion with her concerning total cholesterol 240 Prefers not to be on medications  EKG personally reviewed by myself on todays visit Shows normal sinus rhythm with rate 82 bpm no significant ST or T wave changes   Other past medical history reviewed 30 day monitor reviewed with her showing normal sinus rhythm, no significant arrhythmia, average heart rate 80-90 bpm  Previous labs reviewed personally by myself and with the patient on todays visit total cholesterol 235, LDL 150  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 Faith Rogue   mother has history of A. fib, father with pacemaker, died of a stroke Both parents have very high cholesterol  Echocardiogram from October 2001 is essentially normal   PMH:   has a past medical history of Arthritis, Basal cell carcinoma (05/04/2001), basal cell carcinoma (05/04/2001), dysplastic nevus, Palpitations, and Vitamin D deficiency.  PSH:    Past Surgical History:  Procedure Laterality Date  . ANTERIOR CRUCIATE LIGAMENT REPAIR    . ARTHROSCOPIC REPAIR ACL     left  . BACK  SURGERY    . BIOPSY BREAST     left   . BREAST BIOPSY Left    benign  . LAPAROSCOPY    . MYOMECTOMY    . PAROTIDECTOMY    . THYMECTOMY    . TONSILLECTOMY    . TOTAL HIP ARTHROPLASTY Right 01/10/2018   Procedure: TOTAL HIP ARTHROPLASTY ANTERIOR APPROACH;  Surgeon: Lovell Sheehan, MD;  Location: [facility] ORS;  Service: Orthopedics;  Laterality: Right;    Current Outpatient Medications  Medication Sig Dispense Refill  . Cetirizine HCl 10 MG CAPS Take 10 mg by mouth daily.     . Cholecalciferol (VITAMIN D3) 1000 units CAPS Take 1,000 Units by mouth daily.     Marland Kitchen CRANBERRY PO Take 1 capsule by mouth daily.    . Flaxseed MISC Take 1,000 mg by mouth every other day. Alternating days with the fish oil    . Menthol, Topical Analgesic, (BLUE-EMU MAXIMUM STRENGTH EX) Apply 1 application topically 2 (two) times daily.    . metoprolol succinate (TOPROL-XL) 50 MG 24 hr tablet TAKE 1 TABLET BY MOUTH  DAILY WITH OR IMMEDIATLEY  FOLLOWING A MEAL (Patient taking differently: Take 50 mg by mouth daily. ) 90 tablet 3  . Multiple Vitamins-Minerals (ONE-A-DAY WOMENS 50 PLUS PO) Take 1 tablet by mouth daily.    . Omega-3 Fatty Acids (FISH OIL) 1200 MG CAPS Take 1,200 mg by mouth every other day. Alternating days with flaxseed oil    . OSPHENA 60 MG TABS TAKE 1 TABLET BY MOUTH DAILY 30  tablet 11  . tolterodine (DETROL LA) 4 MG 24 hr capsule TAKE 1 CAPSULE BY MOUTH  DAILY (Patient taking differently: Take 4 mg by mouth daily. ) 90 capsule 3   No current facility-administered medications for this visit.      Allergies:   Aspirin   Social History:  The patient  reports that she has never smoked. She has never used smokeless tobacco. She reports that she does not drink alcohol or use drugs.   Family History:   family history includes Arrhythmia in her mother; Arthritis in her father and mother; Breast cancer in her cousin and maternal aunt; Cancer in her maternal aunt and mother; Heart disease in her mother;  Heart failure in her mother; Hyperlipidemia in her brother and mother; Hypertension in her brother, father, and mother; Prostate cancer in her father.    Review of Systems: Review of Systems  Constitutional: Negative.   HENT: Negative.   Respiratory: Negative.   Cardiovascular: Negative.   Gastrointestinal: Negative.   Musculoskeletal: Negative.   Neurological: Negative.   Psychiatric/Behavioral: Negative.   All other systems reviewed and are negative.   PHYSICAL EXAM: VS:  BP 134/80 (BP Location: Left Arm, Patient Position: Sitting, Cuff Size: Normal)   Pulse 82   Ht 5\' 8"  (1.727 m)   Wt 176 lb (79.8 kg)   BMI 26.76 kg/m  , BMI Body mass index is 26.76 kg/m. Constitutional:  oriented to person, place, and time. No distress.  HENT:  Head: Grossly normal Eyes:  no discharge. No scleral icterus.  Neck: No JVD, no carotid bruits  Cardiovascular: Regular rate and rhythm, no murmurs appreciated Pulmonary/Chest: Clear to auscultation bilaterally, no wheezes or rails Abdominal: Soft.  no distension.  no tenderness.  Musculoskeletal: Normal range of motion Neurological:  normal muscle tone. Coordination normal. No atrophy Skin: Skin warm and dry Psychiatric: normal affect, pleasant   Recent Labs: 04/26/2018: ALT 19; BUN 11; Creatinine, Ser 0.75; Hemoglobin 13.6; Platelets 233; Potassium 4.7; Sodium 143; TSH 3.360    Lipid Panel Lab Results  Component Value Date   CHOL 244 (H) 04/26/2018   HDL 48 04/26/2018   LDLCALC 157 (H) 04/26/2018   TRIG 194 (H) 04/26/2018      Wt Readings from Last 3 Encounters:  09/18/18 176 lb (79.8 kg)  08/10/18 176 lb (79.8 kg)  07/17/18 179 lb (81.2 kg)      ASSESSMENT AND PLAN:  Tachycardia - Plan: EKG 12-Lead Denies having significant paroxysmal tachycardia on metoprolol at current dose Blood pressure also well controlled  Mixed hyperlipidemia - Plan: EKG 12-Lead There is no coronary calcification, no aortic atherosclerosis on prior  CT scans 2015 and 2016 We have discussed her cholesterol with her, she does not want any pills Discussed Zetia also discussed bempidoic acid As well as newer agents coming out of the next several years  Essential hypertension - Plan: EKG 12-Lead Blood pressure is well controlled on today's visit. No changes made to the medications.    Total encounter time more than 25 minutes  Greater than 50% was spent in counseling and coordination of care with the patient   Disposition:   F/U as needed   Orders Placed This Encounter  Procedures  . EKG 12-Lead     Signed, Esmond Plants, M.D., Ph.D. 09/18/2018  [place], [place]

## 2018-09-18 ENCOUNTER — Encounter: Payer: Self-pay | Admitting: Cardiovascular Disease

## 2018-09-18 ENCOUNTER — Ambulatory Visit (INDEPENDENT_AMBULATORY_CARE_PROVIDER_SITE_OTHER): Payer: Managed Care, Other (non HMO) | Admitting: Cardiovascular Disease

## 2018-09-18 ENCOUNTER — Other Ambulatory Visit: Payer: Self-pay

## 2018-09-18 VITALS — BP 134/80 | HR 82 | Ht 68.0 in | Wt 176.0 lb

## 2018-09-18 DIAGNOSIS — I10 Essential (primary) hypertension: Secondary | ICD-10-CM | POA: Diagnosis not present

## 2018-09-18 DIAGNOSIS — I479 Paroxysmal tachycardia, unspecified: Secondary | ICD-10-CM | POA: Diagnosis not present

## 2018-09-18 DIAGNOSIS — E782 Mixed hyperlipidemia: Secondary | ICD-10-CM | POA: Diagnosis not present

## 2018-09-18 NOTE — Patient Instructions (Addendum)
Read about zetia and bempidoic acid for cholesterol   Medication Instructions:  No changes  If you need a refill on your cardiac medications before your next appointment, please call your pharmacy.    Lab work: No new labs needed   If you have labs (blood work) drawn today and your tests are completely normal, you will receive your results only by: Marland Kitchen MyChart Message (if you have MyChart) OR . A paper copy in the mail If you have any lab test that is abnormal or we need to change your treatment, we will call you to review the results.   Testing/Procedures: No new testing needed   Follow-Up: At [facility], you and your health needs are our priority.  As part of our continuing mission to provide you with exceptional heart care, we have created designated Provider Care Teams.  These Care Teams include your primary Cardiologist (physician) and Advanced Practice Providers (APPs -  Physician Assistants and Nurse Practitioners) who all work together to provide you with the care you need, when you need it.  . You will need a follow up appointment as needed   . Providers on your designated Care Team:   . Murray Hodgkins, NP . Christell Faith, PA-C . Marrianne Mood, PA-C  Any Other Special Instructions Will Be Listed Below (If Applicable).  For educational health videos Log in to : [email or web address] Or : [email or web address], password : [email or web address]

## 2018-09-24 ENCOUNTER — Other Ambulatory Visit: Payer: Self-pay | Admitting: Family Medicine

## 2018-10-04 ENCOUNTER — Ambulatory Visit
Admission: RE | Admit: 2018-10-04 | Discharge: 2018-10-04 | Disposition: A | Discharge status: Home / Self Care | Payer: Managed Care, Other (non HMO) | Source: Ambulatory Visit | Attending: Obstetrics and Gynecology | Admitting: Obstetrics and Gynecology

## 2018-10-04 ENCOUNTER — Other Ambulatory Visit: Payer: Self-pay

## 2018-10-04 DIAGNOSIS — M810 Age-related osteoporosis without current pathological fracture: Secondary | ICD-10-CM | POA: Insufficient documentation

## 2018-10-04 DIAGNOSIS — M8580 Other specified disorders of bone density and structure, unspecified site: Secondary | ICD-10-CM

## 2018-10-24 ENCOUNTER — Other Ambulatory Visit: Payer: Self-pay | Admitting: Family Medicine

## 2018-10-24 ENCOUNTER — Ambulatory Visit
Admission: RE | Admit: 2018-10-24 | Discharge: 2018-10-24 | Disposition: A | Discharge status: Home / Self Care | Payer: No Typology Code available for payment source | Source: Ambulatory Visit | Attending: Family Medicine | Admitting: Family Medicine

## 2018-10-24 ENCOUNTER — Other Ambulatory Visit: Payer: Self-pay

## 2018-10-24 DIAGNOSIS — R609 Edema, unspecified: Secondary | ICD-10-CM | POA: Diagnosis present

## 2018-10-24 DIAGNOSIS — R52 Pain, unspecified: Secondary | ICD-10-CM | POA: Diagnosis present

## 2018-10-30 ENCOUNTER — Telehealth: Payer: Self-pay | Admitting: Obstetrics and Gynecology

## 2018-10-30 NOTE — Telephone Encounter (Signed)
Please advise 

## 2018-10-30 NOTE — Telephone Encounter (Signed)
Patient is calling to find out her results for her bone density scan. Please advise

## 2018-11-02 DIAGNOSIS — S93401A Sprain of unspecified ligament of right ankle, initial encounter: Secondary | ICD-10-CM | POA: Insufficient documentation

## 2018-11-20 NOTE — Telephone Encounter (Signed)
error 

## 2018-11-28 ENCOUNTER — Ambulatory Visit: Payer: Managed Care, Other (non HMO) | Admitting: Family Medicine

## 2018-11-28 ENCOUNTER — Encounter: Payer: Self-pay | Admitting: Family Medicine

## 2018-11-28 ENCOUNTER — Other Ambulatory Visit: Payer: Self-pay

## 2018-11-28 VITALS — BP 124/70 | HR 100 | Temp 97.9°F | Resp 16 | Ht 68.0 in | Wt 176.0 lb

## 2018-11-28 DIAGNOSIS — R1011 Right upper quadrant pain: Secondary | ICD-10-CM | POA: Diagnosis not present

## 2018-11-28 DIAGNOSIS — R1084 Generalized abdominal pain: Secondary | ICD-10-CM | POA: Diagnosis not present

## 2018-11-28 DIAGNOSIS — R109 Unspecified abdominal pain: Secondary | ICD-10-CM | POA: Diagnosis not present

## 2018-11-28 LAB — POCT URINALYSIS DIPSTICK
Bilirubin, UA: NEGATIVE
Glucose, UA: NEGATIVE
Ketones, UA: NEGATIVE
Leukocytes, UA: NEGATIVE
Nitrite, UA: NEGATIVE
Protein, UA: NEGATIVE
Spec Grav, UA: 1.025 (ref 1.010–1.025)
Urobilinogen, UA: 0.2 E.U./dL
pH, UA: 6.5 (ref 5.0–8.0)

## 2018-11-28 MED ORDER — CYCLOBENZAPRINE HCL 10 MG PO TABS: 10.0000 mg | ORAL_TABLET | Freq: Three times a day (TID) | ORAL | 0 refills | Status: DC | PRN

## 2018-11-28 NOTE — Patient Instructions (Signed)
Apply topical IcyHot in affected area.

## 2018-11-28 NOTE — Progress Notes (Signed)
Patient: Hannah Gonzalez Female    DOB: [birth year]   58 y.o.   MRN: [id number] Visit Date: 11/28/2018  Today's Provider: [name], MD   Chief Complaint  Patient presents with   pain in right side.   Subjective:   HPI Patient comes in today c/o pain in her right side X 2-3 months. She denies any injuries. She describes it as a dull ache. She denies any urinary or bowel changes. She has only taken an occasional Tylenol to help with symptoms.  The pain is in the right flank and right upper quadrant.  It is bothered by certain twisting movements.  It is been going on a few months. BP Readings from Last 3 Encounters:  11/28/18 124/70  09/18/18 134/80  08/10/18 130/72   Wt Readings from Last 3 Encounters:  11/28/18 176 lb (79.8 kg)  09/18/18 176 lb (79.8 kg)  08/10/18 176 lb (79.8 kg)    Allergies  Allergen Reactions   Aspirin Hives and Rash     Current Outpatient Medications:    Cetirizine HCl 10 MG CAPS, Take 10 mg by mouth daily. , Disp: , Rfl:    Cholecalciferol (VITAMIN D3) 1000 units CAPS, Take 1,000 Units by mouth daily. , Disp: , Rfl:    CRANBERRY PO, Take 1 capsule by mouth daily., Disp: , Rfl:    Flaxseed MISC, Take 1,000 mg by mouth every other day. Alternating days with the fish oil, Disp: , Rfl:    Menthol, Topical Analgesic, (BLUE-EMU MAXIMUM STRENGTH EX), Apply 1 application topically 2 (two) times daily., Disp: , Rfl:    metoprolol succinate (TOPROL-XL) 50 MG 24 hr tablet, TAKE 1 TABLET BY MOUTH  DAILY WITH OR IMMEDIATLEY  FOLLOWING A MEAL, Disp: 90 tablet, Rfl: 3   Multiple Vitamins-Minerals (ONE-A-DAY WOMENS 50 PLUS PO), Take 1 tablet by mouth daily., Disp: , Rfl:    Omega-3 Fatty Acids (FISH OIL) 1200 MG CAPS, Take 1,200 mg by mouth every other day. Alternating days with flaxseed oil, Disp: , Rfl:    OSPHENA 60 MG TABS, TAKE 1 TABLET BY MOUTH DAILY, Disp: 30 tablet, Rfl: 11   tolterodine (DETROL LA) 4 MG 24 hr capsule, TAKE 1  CAPSULE BY MOUTH  DAILY (Patient taking differently: Take 4 mg by mouth daily. ), Disp: 90 capsule, Rfl: 3  Review of Systems  Constitutional: Negative for activity change and fatigue.  Respiratory: Negative for cough and shortness of breath.   Cardiovascular: Negative for chest pain, palpitations and leg swelling.  Gastrointestinal: Negative for abdominal pain, blood in stool, constipation, diarrhea, nausea, rectal pain and vomiting.  Genitourinary: Positive for flank pain. Negative for difficulty urinating, dysuria, hematuria, urgency, vaginal bleeding, vaginal discharge and vaginal pain.  Musculoskeletal: Negative for arthralgias and myalgias.  Skin: Negative.   Allergic/Immunologic: Negative.   Neurological: Negative for dizziness, light-headedness and headaches.  Psychiatric/Behavioral: Negative for agitation, self-injury, sleep disturbance and suicidal ideas. The patient is not nervous/anxious.     Social History   Tobacco Use   Smoking status: Never Smoker   Smokeless tobacco: Never Used  Substance Use Topics   Alcohol use: No      Objective:   BP 124/70    Pulse 100    Temp 97.9 F (36.6 C)    Resp 16    Ht 5\' 8"  (1.727 m)    Wt 176 lb (79.8 kg)    SpO2 99%    BMI 26.76 kg/m  Vitals:  11/28/18 0815  BP: 124/70  Pulse: 100  Resp: 16  Temp: 97.9 F (36.6 C)  SpO2: 99%  Weight: 176 lb (79.8 kg)  Height: 5\' 8"  (1.727 m)  Body mass index is 26.76 kg/m.   Physical Exam Vitals signs reviewed.  Constitutional:      Appearance: Normal appearance. She is well-developed.  HENT:     Head: Normocephalic and atraumatic.     Right Ear: External ear normal.     Left Ear: External ear normal.     Nose: Nose normal.  Eyes:     General: No scleral icterus.    Conjunctiva/sclera: Conjunctivae normal.  Neck:     Thyroid: No thyromegaly.  Cardiovascular:     Rate and Rhythm: Normal rate and regular rhythm.     Heart sounds: Normal heart sounds.  Pulmonary:      Effort: Pulmonary effort is normal.     Breath sounds: Normal breath sounds.  Abdominal:     Palpations: Abdomen is soft.     Tenderness: There is no abdominal tenderness.     Comments: She has minimal tenderness in the right lower lateral ribs and upper flank.  Genitourinary:    Vagina: Normal. No vaginal discharge.     Rectum: Guaiac result negative.  Lymphadenopathy:     Cervical: No cervical adenopathy.  Skin:    General: Skin is warm and dry.     Comments: Fair skin.  Neurological:     Mental Status: She is alert and oriented to person, place, and time. Mental status is at baseline.  Psychiatric:        Mood and Affect: Mood normal.        Behavior: Behavior normal.        Thought Content: Thought content normal.        Judgment: Judgment normal.      No results found for any visits on 11/28/18.     Assessment & Plan    1. RUQ abdominal pain Pain ultrasound although I think will be negative. - cyclobenzaprine (FLEXERIL) 10 MG tablet; Take 1 tablet (10 mg total) by mouth 3 (three) times daily as needed for muscle spasms.  Dispense: 30 tablet; Refill: 0 - POCT urinalysis dipstick - CBC with Differential/Platelet - Comprehensive metabolic panel - Urine Culture  2. Flank pain I think this is musculoskeletal pain.  However due to a couple of months duration will obtain ultrasound and lab work.  Will treat with Flexeril nightly.  See her back in a few weeks.  Use topical agents to help with discomfort. - cyclobenzaprine (FLEXERIL) 10 MG tablet; Take 1 tablet (10 mg total) by mouth 3 (three) times daily as needed for muscle spasms.  Dispense: 30 tablet; Refill: 0 - POCT urinalysis dipstick - CBC with Differential/Platelet - Comprehensive metabolic panel  3. Generalized abdominal pain  - US Abdomen Complete; Future - Comprehensive metabolic panel     [name], MD  [facility]

## 2018-11-29 ENCOUNTER — Telehealth: Payer: Self-pay

## 2018-11-29 LAB — CBC WITH DIFFERENTIAL/PLATELET
Basophils Absolute: 0 10*3/uL (ref 0.0–0.2)
Basos: 0 %
EOS (ABSOLUTE): 0.1 10*3/uL (ref 0.0–0.4)
Eos: 1 %
Hematocrit: 39.9 % (ref 34.0–46.6)
Hemoglobin: 13.4 g/dL (ref 11.1–15.9)
Immature Grans (Abs): 0 10*3/uL (ref 0.0–0.1)
Immature Granulocytes: 0 %
Lymphocytes Absolute: 2 10*3/uL (ref 0.7–3.1)
Lymphs: 39 %
MCH: 29.8 pg (ref 26.6–33.0)
MCHC: 33.6 g/dL (ref 31.5–35.7)
MCV: 89 fL (ref 79–97)
Monocytes Absolute: 0.4 10*3/uL (ref 0.1–0.9)
Monocytes: 7 %
Neutrophils Absolute: 2.6 10*3/uL (ref 1.4–7.0)
Neutrophils: 53 %
Platelets: 250 10*3/uL (ref 150–450)
RBC: 4.5 x10E6/uL (ref 3.77–5.28)
RDW: 12.9 % (ref 11.7–15.4)
WBC: 5.1 10*3/uL (ref 3.4–10.8)

## 2018-11-29 LAB — COMPREHENSIVE METABOLIC PANEL
ALT: 16 IU/L (ref 0–32)
AST: 18 IU/L (ref 0–40)
Albumin/Globulin Ratio: 2.1 (ref 1.2–2.2)
Albumin: 4.5 g/dL (ref 3.8–4.9)
Alkaline Phosphatase: 60 IU/L (ref 39–117)
BUN/Creatinine Ratio: 16 (ref 9–23)
BUN: 15 mg/dL (ref 6–24)
Bilirubin Total: 0.4 mg/dL (ref 0.0–1.2)
CO2: 24 mmol/L (ref 20–29)
Calcium: 9.6 mg/dL (ref 8.7–10.2)
Chloride: 105 mmol/L (ref 96–106)
Creatinine, Ser: 0.94 mg/dL (ref 0.57–1.00)
GFR calc Af Amer: 77 mL/min/{1.73_m2} (ref >59)
GFR calc non Af Amer: 67 mL/min/{1.73_m2} (ref >59)
Globulin, Total: 2.1 g/dL (ref 1.5–4.5)
Glucose: 91 mg/dL (ref 65–99)
Potassium: 4.4 mmol/L (ref 3.5–5.2)
Sodium: 144 mmol/L (ref 134–144)
Total Protein: 6.6 g/dL (ref 6.0–8.5)

## 2018-11-29 NOTE — Telephone Encounter (Signed)
LMTCB regarding lab results.  

## 2018-11-29 NOTE — Telephone Encounter (Signed)
-----   Message from [name]., MD sent at 11/29/2018  3:00 PM EDT ----- Labs in normal range.

## 2018-11-30 LAB — URINE CULTURE

## 2018-11-30 NOTE — Telephone Encounter (Signed)
-----   Message from [name]., MD sent at 11/30/2018  8:24 AM EDT ----- Urine negative for infection.

## 2018-11-30 NOTE — Telephone Encounter (Signed)
LMTCB 11/30/2018  Thanks,   -Mickel Baas

## 2018-12-03 ENCOUNTER — Telehealth: Payer: Self-pay

## 2018-12-03 NOTE — Telephone Encounter (Signed)
Patient notified of lab results

## 2018-12-04 ENCOUNTER — Other Ambulatory Visit: Payer: Self-pay

## 2018-12-04 ENCOUNTER — Ambulatory Visit
Admission: RE | Admit: 2018-12-04 | Discharge: 2018-12-04 | Disposition: A | Discharge status: Home / Self Care | Payer: Managed Care, Other (non HMO) | Source: Ambulatory Visit | Attending: Family Medicine | Admitting: Family Medicine

## 2018-12-04 DIAGNOSIS — R1084 Generalized abdominal pain: Secondary | ICD-10-CM | POA: Insufficient documentation

## 2018-12-06 ENCOUNTER — Telehealth: Payer: Self-pay

## 2018-12-06 NOTE — Telephone Encounter (Signed)
LMTCB

## 2018-12-06 NOTE — Telephone Encounter (Signed)
-----   Message from [name]., MD sent at 12/05/2018  8:01 AM EDT ----- [place] normal.

## 2018-12-07 NOTE — Telephone Encounter (Signed)
Advised 

## 2018-12-10 NOTE — Progress Notes (Signed)
Patient: Hannah Gonzalez Female    DOB: 1960-07-06   58 y.o.   MRN: [id number] Visit Date: 12/11/2018  Today's Provider: [name], MD   Chief Complaint  Patient presents with  . Follow-up  . Abdominal Pain   Subjective:   HPI RUQ abdominal pain From 11/28/2018- Abdominal US and labs were WNL.    Flank pain From 11/28/2018- Treat w/ Flexeril 10mg  daily and reassess in 2 weeks. Patient reports that she has noticed more pain in her lower back since she was seen last. She reports that the Flexeril has not helped with this.   Patient also mentions left ear fullness and tenderness. She reports that this has been an ongoing issue for the last 3 months.   Allergies  Allergen Reactions  . Aspirin Hives and Rash     Current Outpatient Medications:  .  Cetirizine HCl 10 MG CAPS, Take 10 mg by mouth daily. , Disp: , Rfl:  .  Cholecalciferol (VITAMIN D3) 1000 units CAPS, Take 1,000 Units by mouth daily. , Disp: , Rfl:  .  CRANBERRY PO, Take 1 capsule by mouth daily., Disp: , Rfl:  .  cyclobenzaprine (FLEXERIL) 10 MG tablet, Take 1 tablet (10 mg total) by mouth 3 (three) times daily as needed for muscle spasms., Disp: 30 tablet, Rfl: 0 .  Flaxseed MISC, Take 1,000 mg by mouth every other day. Alternating days with the fish oil, Disp: , Rfl:  .  Menthol, Topical Analgesic, (BLUE-EMU MAXIMUM STRENGTH EX), Apply 1 application topically 2 (two) times daily., Disp: , Rfl:  .  metoprolol succinate (TOPROL-XL) 50 MG 24 hr tablet, TAKE 1 TABLET BY MOUTH  DAILY WITH OR IMMEDIATLEY  FOLLOWING A MEAL, Disp: 90 tablet, Rfl: 3 .  Multiple Vitamins-Minerals (ONE-A-DAY WOMENS 50 PLUS PO), Take 1 tablet by mouth daily., Disp: , Rfl:  .  Omega-3 Fatty Acids (FISH OIL) 1200 MG CAPS, Take 1,200 mg by mouth every other day. Alternating days with flaxseed oil, Disp: , Rfl:  .  OSPHENA 60 MG TABS, TAKE 1 TABLET BY MOUTH DAILY, Disp: 30 tablet, Rfl: 11 .  tolterodine (DETROL LA) 4 MG 24 hr  capsule, TAKE 1 CAPSULE BY MOUTH  DAILY (Patient taking differently: Take 4 mg by mouth daily. ), Disp: 90 capsule, Rfl: 3  Review of Systems  Constitutional: Negative for activity change, fatigue and fever.  HENT: Positive for ear pain.   Eyes: Negative.   Cardiovascular: Negative for chest pain, palpitations and leg swelling.  Gastrointestinal: Negative.   Endocrine: Negative.   Musculoskeletal: Positive for arthralgias, back pain and myalgias.  Allergic/Immunologic: Positive for environmental allergies.  Neurological: Negative for dizziness, light-headedness and headaches.  Psychiatric/Behavioral: Negative for agitation, self-injury and sleep disturbance. The patient is not nervous/anxious.     Social History   Tobacco Use  . Smoking status: Never Smoker  . Smokeless tobacco: Never Used  Substance Use Topics  . Alcohol use: No      Objective:   BP 112/72   Pulse 100   Temp 97.9 F (36.6 C)   Resp 16   Ht 5\' 8"  (1.727 m)   Wt 176 lb (79.8 kg)   SpO2 98%   BMI 26.76 kg/m  Vitals:   12/11/18 0926  BP: 112/72  Pulse: 100  Resp: 16  Temp: 97.9 F (36.6 C)  SpO2: 98%  Weight: 176 lb (79.8 kg)  Height: 5\' 8"  (1.727 m)  Body mass index is  26.76 kg/m.   Physical Exam Vitals signs reviewed.  Constitutional:      Appearance: Normal appearance. She is well-developed.  HENT:     Head: Normocephalic and atraumatic.     Right Ear: External ear normal.     Left Ear: External ear normal.     Nose: Nose normal.  Eyes:     General: No scleral icterus.    Conjunctiva/sclera: Conjunctivae normal.  Neck:     Thyroid: No thyromegaly.  Cardiovascular:     Rate and Rhythm: Normal rate and regular rhythm.     Heart sounds: Normal heart sounds.  Pulmonary:     Effort: Pulmonary effort is normal.     Breath sounds: Normal breath sounds.  Abdominal:     Palpations: Abdomen is soft.  Genitourinary:    Vagina: Normal. No vaginal discharge.     Rectum: Guaiac result  negative.  Lymphadenopathy:     Cervical: No cervical adenopathy.  Skin:    General: Skin is warm and dry.     Comments: Fair skin.  Neurological:     General: No focal deficit present.     Mental Status: She is alert and oriented to person, place, and time. Mental status is at baseline.  Psychiatric:        Mood and Affect: Mood normal.        Behavior: Behavior normal.        Thought Content: Thought content normal.        Judgment: Judgment normal.   Crackles" of left TMJ with opening mouth.   No results found for any visits on 12/11/18.     Assessment & Plan    1. Low back pain, unspecified back pain laterality, unspecified chronicity, unspecified whether sciatica present Pain in area of the right SI joint.  Treat with a short burst of steroids.  May need PT referral - DG Lumbar Spine Complete; Future - predniSONE (STERAPRED UNI-PAK 21 TAB) 10 MG (21) TBPK tablet; Taper as directed.  Dispense: 21 tablet; Refill: 1  2. RUQ abdominal pain I think this is musculoskeletal - cyclobenzaprine (FLEXERIL) 10 MG tablet; Take 1 tablet (10 mg total) by mouth 3 (three) times daily as needed for muscle spasms.  Dispense: 90 tablet; Refill: 1  3. Flank pain  - cyclobenzaprine (FLEXERIL) 10 MG tablet; Take 1 tablet (10 mg total) by mouth 3 (three) times daily as needed for muscle spasms.  Dispense: 90 tablet; Refill: 1  4. Arthralgia of left temporomandibular joint TMJ problems.  May need dental referral for possible bite block. - predniSONE (STERAPRED UNI-PAK 21 TAB) 10 MG (21) TBPK tablet; Taper as directed.  Dispense: 21 tablet; Refill: 1     I, Rachelle L. Presley, CMA, am acting as a Education administrator for [employer]. Rosanna Randy, Ada    [name], MD  [facility]

## 2018-12-11 ENCOUNTER — Ambulatory Visit
Admission: RE | Admit: 2018-12-11 | Discharge: 2018-12-11 | Disposition: A | Discharge status: Home / Self Care | Payer: Managed Care, Other (non HMO) | Attending: Family Medicine | Admitting: Family Medicine

## 2018-12-11 ENCOUNTER — Ambulatory Visit: Payer: Managed Care, Other (non HMO) | Admitting: Family Medicine

## 2018-12-11 ENCOUNTER — Other Ambulatory Visit: Payer: Self-pay

## 2018-12-11 ENCOUNTER — Encounter: Payer: Self-pay | Admitting: Family Medicine

## 2018-12-11 ENCOUNTER — Ambulatory Visit
Admission: RE | Admit: 2018-12-11 | Discharge: 2018-12-11 | Disposition: A | Discharge status: Home / Self Care | Payer: Managed Care, Other (non HMO) | Source: Ambulatory Visit | Attending: Family Medicine | Admitting: Family Medicine

## 2018-12-11 VITALS — BP 112/72 | HR 100 | Temp 97.9°F | Resp 16 | Ht 68.0 in | Wt 176.0 lb

## 2018-12-11 DIAGNOSIS — R109 Unspecified abdominal pain: Secondary | ICD-10-CM | POA: Diagnosis not present

## 2018-12-11 DIAGNOSIS — M545 Low back pain, unspecified: Secondary | ICD-10-CM

## 2018-12-11 DIAGNOSIS — M26622 Arthralgia of left temporomandibular joint: Secondary | ICD-10-CM

## 2018-12-11 DIAGNOSIS — R1011 Right upper quadrant pain: Secondary | ICD-10-CM

## 2018-12-11 MED ORDER — CYCLOBENZAPRINE HCL 10 MG PO TABS: 10.0000 mg | ORAL_TABLET | Freq: Three times a day (TID) | ORAL | 1 refills | Status: DC | PRN

## 2018-12-11 MED ORDER — PREDNISONE 10 MG (21) PO TBPK: ORAL_TABLET | ORAL | 1 refills | Status: DC

## 2018-12-12 ENCOUNTER — Encounter: Payer: Self-pay | Admitting: *Deleted

## 2019-01-06 ENCOUNTER — Other Ambulatory Visit: Payer: Self-pay | Admitting: Family Medicine

## 2019-03-21 ENCOUNTER — Other Ambulatory Visit: Payer: Self-pay | Admitting: Family Medicine

## 2019-03-21 DIAGNOSIS — Z1231 Encounter for screening mammogram for malignant neoplasm of breast: Secondary | ICD-10-CM

## 2019-03-29 NOTE — Progress Notes (Signed)
Patient: Hannah Gonzalez, Female    DOB: 10/21/1960, 59 y.o.   MRN: [id number] Visit Date: 04/01/2019  Today's Provider: [name], MD   Chief Complaint  Patient presents with  . Annual Exam   Subjective:     Annual physical exam Hannah Gonzalez is a 59 y.o. female who presents today for health maintenance and complete physical. She feels well. She reports exercising none. She reports she is sleeping well. She sees GYN for well woman exam yearly and sees dermatology for her fair skin yearly. -----------------------------------------------------------  Colonoscopy: 05/19/2015 Mammogram: 04/18/2018 Pap: 08/10/2018- [facility]  Review of Systems  Constitutional: Negative.   HENT: Negative.   Eyes: Negative.   Respiratory: Negative.   Cardiovascular: Negative.   Gastrointestinal: Negative.   Endocrine: Negative.   Genitourinary: Positive for urgency.  Allergic/Immunologic: Positive for environmental allergies.  Neurological: Negative.   Hematological: Negative.   Psychiatric/Behavioral: Negative.     Social History      She  reports that she has never smoked. She has never used smokeless tobacco. She reports that she does not drink alcohol or use drugs.       Social History   Socioeconomic History  . Marital status: Married    Spouse name: Shanon Brow  . Number of children: 0  . Years of education: associates  . Highest education level: Not on file  Occupational History    Employer: [employer]  Tobacco Use  . Smoking status: Never Smoker  . Smokeless tobacco: Never Used  Substance and Sexual Activity  . Alcohol use: No  . Drug use: No  . Sexual activity: Not Currently    Birth control/protection: Post-menopausal  Other Topics Concern  . Not on file  Social History Narrative  . Not on file   Social Determinants of Health   Financial Resource Strain:   . Difficulty of Paying Living Expenses: Not on file  Food Insecurity:   . Worried  About Charity fundraiser in the Last Year: Not on file  . Ran Out of Food in the Last Year: Not on file  Transportation Needs:   . Lack of Transportation (Medical): Not on file  . Lack of Transportation (Non-Medical): Not on file  Physical Activity:   . Days of Exercise per Week: Not on file  . Minutes of Exercise per Session: Not on file  Stress:   . Feeling of Stress : Not on file  Social Connections:   . Frequency of Communication with Friends and Family: Not on file  . Frequency of Social Gatherings with Friends and Family: Not on file  . Attends Religious Services: Not on file  . Active Member of Clubs or Organizations: Not on file  . Attends Archivist Meetings: Not on file  . Marital Status: Not on file    Past Medical History:  Diagnosis Date  . Arthritis   . Basal cell carcinoma 05/04/2001   Basal cell left thigh  . Hx of basal cell carcinoma 05/04/2001   L sup. ant. thigh  . Hx of dysplastic nevus    multiple sites  . Palpitations   . Vitamin D deficiency      Patient Active Problem List   Diagnosis Date Noted  . Osteoarthritis of right hip 01/10/2018  . Paroxysmal tachycardia ([place]) 06/21/2017  . Chronic thoracic back pain 06/21/2017  . Chronic pain of right hip 06/21/2017  . Lumbar radiculopathy 07/13/2016  . Pain of both  hip joints 07/12/2016  . Avitaminosis D 08/26/2014  . Anxiety, generalized 08/26/2014  . Acid reflux 08/26/2014  . Hypercholesteremia 08/26/2014  . Essential hypertension 08/26/2014  . 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 Tachycardia 05/23/2014  . Mixed hyperlipidemia 05/23/2014  . 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 ANTERIOR CRUCIATE LIGAMENT REPAIR    . ARTHROSCOPIC REPAIR ACL     left  . BACK SURGERY    . BIOPSY BREAST     left   . BREAST BIOPSY Left    benign  . LAPAROSCOPY    . MYOMECTOMY    .  PAROTIDECTOMY    . THYMECTOMY    . TONSILLECTOMY    . TOTAL HIP ARTHROPLASTY Right 01/10/2018   Procedure: TOTAL HIP ARTHROPLASTY ANTERIOR APPROACH;  Surgeon: Lovell Sheehan, MD;  Location: [facility] ORS;  Service: Orthopedics;  Laterality: Right;    Family History        Family Status  Relation Name Status  . Mother  Alive  . Brother  Alive  . Father  Deceased       pacemaker implant  . Cousin maternal Alive  . Mat Aunt  (Not Specified)        Her family history includes Arrhythmia in her mother; Arthritis in her father and mother; Breast cancer in her cousin and maternal aunt; Cancer in her maternal aunt and mother; Heart disease in her mother; Heart failure in her mother; Hyperlipidemia in her brother and mother; Hypertension in her brother, father, and mother; Prostate cancer in her father.      Allergies  Allergen Reactions  . Aspirin Hives and Rash     Current Outpatient Medications:  .  Cetirizine HCl 10 MG CAPS, Take 10 mg by mouth daily. , Disp: , Rfl:  .  Cholecalciferol (VITAMIN D3) 1000 units CAPS, Take 1,000 Units by mouth daily. , Disp: , Rfl:  .  CRANBERRY PO, Take 1 capsule by mouth daily., Disp: , Rfl:  .  Menthol, Topical Analgesic, (BLUE-EMU MAXIMUM STRENGTH EX), Apply 1 application topically 2 (two) times daily., Disp: , Rfl:  .  metoprolol succinate (TOPROL-XL) 50 MG 24 hr tablet, TAKE 1 TABLET BY MOUTH  DAILY WITH OR IMMEDIATLEY  FOLLOWING A MEAL, Disp: 90 tablet, Rfl: 3 .  Multiple Vitamins-Minerals (ONE-A-DAY WOMENS 50 PLUS PO), Take 1 tablet by mouth daily., Disp: , Rfl:  .  Omega-3 Fatty Acids (FISH OIL) 1200 MG CAPS, Take 1,200 mg by mouth every other day. Alternating days with flaxseed oil, Disp: , Rfl:  .  OSPHENA 60 MG TABS, TAKE 1 TABLET BY MOUTH DAILY, Disp: 30 tablet, Rfl: 11 .  tolterodine (DETROL LA) 4 MG 24 hr capsule, TAKE 1 CAPSULE BY MOUTH  DAILY, Disp: 90 capsule, Rfl: 1 .  cyclobenzaprine (FLEXERIL) 10 MG tablet, Take 1 tablet (10 mg total) by  mouth 3 (three) times daily as needed for muscle spasms. (Patient not taking: Reported on 04/01/2019), Disp: 90 tablet, Rfl: 1 .  Flaxseed MISC, Take 1,000 mg by mouth every other day. Alternating days with the fish oil, Disp: , Rfl:  .  predniSONE (STERAPRED UNI-PAK 21 TAB) 10 MG (21) TBPK tablet, Taper as directed. (Patient not taking: Reported on 04/01/2019), Disp: 21 tablet, Rfl: 1   Patient Care Team: [name]., MD as PCP - General (Family Medicine) Rockey Situ, Kathlene November, MD as Consulting Physician (  Cardiology)    Objective:    Vitals: BP 136/84 (BP Location: Right Arm, Patient Position: Sitting, Cuff Size: Large)   Pulse 95   Temp (!) 97.3 F (36.3 C) (Other (Comment))   Resp 18   Ht 5\' 8"  (1.727 m)   Wt 174 lb (78.9 kg)   SpO2 97%   BMI 26.46 kg/m    Vitals:   04/01/19 0911  BP: 136/84  Pulse: 95  Resp: 18  Temp: (!) 97.3 F (36.3 C)  TempSrc: Other (Comment)  SpO2: 97%  Weight: 174 lb (78.9 kg)  Height: 5\' 8"  (1.727 m)     Physical Exam Vitals reviewed.  Constitutional:      Appearance: Normal appearance. She is well-developed.  HENT:     Head: Normocephalic and atraumatic.     Right Ear: External ear normal.     Left Ear: External ear normal.     Nose: Nose normal.  Eyes:     General: No scleral icterus.    Conjunctiva/sclera: Conjunctivae normal.  Neck:     Thyroid: No thyromegaly.  Cardiovascular:     Rate and Rhythm: Normal rate and regular rhythm.     Heart sounds: Normal heart sounds.  Pulmonary:     Effort: Pulmonary effort is normal.     Breath sounds: Normal breath sounds.  Abdominal:     Palpations: Abdomen is soft.  Genitourinary:    Vagina: Normal. No vaginal discharge.     Rectum: Guaiac result negative.  Lymphadenopathy:     Cervical: No cervical adenopathy.  Skin:    General: Skin is warm and dry.     Comments: Fair skin.  Neurological:     General: No focal deficit present.     Mental Status: She is alert and oriented to  person, place, and time. Mental status is at baseline.  Psychiatric:        Mood and Affect: Mood normal.        Behavior: Behavior normal.        Thought Content: Thought content normal.        Judgment: Judgment normal.      Depression Screen PHQ 2/9 Scores 04/01/2019 03/27/2018 03/23/2017 03/22/2016  PHQ - 2 Score 0 0 0 0  PHQ- 9 Score 0 0 0 -       Assessment & Plan:     Routine Health Maintenance and Physical Exam  Exercise Activities and Dietary recommendations Goals   None     Immunization History  Administered Date(s) Administered  . Influenza Split 11/22/2015  . Influenza Whole 11/21/2016  . Influenza-Unspecified 12/03/2014  . Td 09/21/2015  . Tdap 08/30/2005    Health Maintenance  Topic Date Due  . INFLUENZA VACCINE  09/22/2018  . MAMMOGRAM  04/18/2020  . PAP SMEAR-Modifier  08/09/2021  . COLONOSCOPY  05/18/2025  . TETANUS/TDAP  09/20/2025  . Hepatitis C Screening  Completed  . HIV Screening  Completed     Discussed health benefits of physical activity, and encouraged her to engage in regular exercise appropriate for her age and condition.    --------------------------------------------------------------------  1. Annual physical exam  - Lipid panel - TSH - CBC w/Diff/Platelet - Comprehensive Metabolic Panel (CMET)  2. Essential hypertension  - Lipid panel - TSH - CBC w/Diff/Platelet - Comprehensive Metabolic Panel (CMET)  3. Hypercholesteremia  - Lipid panel - TSH - CBC w/Diff/Platelet - Comprehensive Metabolic Panel (CMET)  4. Encounter for screening laboratory testing for COVID-19 virus  - SAR  CoV2 Serology (COVID 19)AB(IGG)IA 5.h/o Thymoma  Follow up in one year for CPE.     I,Verbena Boeding,acting as a scribe for [name], MD.,have documented all relevant documentation on the behalf of [name], MD,as directed by  [name], MD while in the presence of [name], MD.    [name], MD  [facility]

## 2019-04-01 ENCOUNTER — Encounter: Payer: Self-pay | Admitting: Family Medicine

## 2019-04-01 ENCOUNTER — Ambulatory Visit (INDEPENDENT_AMBULATORY_CARE_PROVIDER_SITE_OTHER): Payer: Managed Care, Other (non HMO) | Admitting: Family Medicine

## 2019-04-01 ENCOUNTER — Other Ambulatory Visit: Payer: Self-pay

## 2019-04-01 VITALS — BP 136/84 | HR 95 | Temp 97.3°F | Resp 18 | Ht 68.0 in | Wt 174.0 lb

## 2019-04-01 DIAGNOSIS — Z20822 Contact with and (suspected) exposure to covid-19: Secondary | ICD-10-CM

## 2019-04-01 DIAGNOSIS — E7800 Pure hypercholesterolemia, unspecified: Secondary | ICD-10-CM

## 2019-04-01 DIAGNOSIS — Z0000 Encounter for general adult medical examination without abnormal findings: Secondary | ICD-10-CM | POA: Diagnosis not present

## 2019-04-01 DIAGNOSIS — I10 Essential (primary) hypertension: Secondary | ICD-10-CM | POA: Diagnosis not present

## 2019-04-01 DIAGNOSIS — D4989 Neoplasm of unspecified behavior of other specified sites: Secondary | ICD-10-CM

## 2019-04-04 ENCOUNTER — Telehealth: Payer: Self-pay

## 2019-04-04 LAB — COMPREHENSIVE METABOLIC PANEL
ALT: 18 IU/L (ref 0–32)
AST: 21 IU/L (ref 0–40)
Albumin/Globulin Ratio: 2.1 (ref 1.2–2.2)
Albumin: 4.2 g/dL (ref 3.8–4.9)
Alkaline Phosphatase: 59 IU/L (ref 39–117)
BUN/Creatinine Ratio: 14 (ref 9–23)
BUN: 12 mg/dL (ref 6–24)
Bilirubin Total: 0.3 mg/dL (ref 0.0–1.2)
CO2: 24 mmol/L (ref 20–29)
Calcium: 9.1 mg/dL (ref 8.7–10.2)
Chloride: 103 mmol/L (ref 96–106)
Creatinine, Ser: 0.86 mg/dL (ref 0.57–1.00)
GFR calc Af Amer: 86 mL/min/{1.73_m2} (ref >59)
GFR calc non Af Amer: 74 mL/min/{1.73_m2} (ref >59)
Globulin, Total: 2 g/dL (ref 1.5–4.5)
Glucose: 89 mg/dL (ref 65–99)
Potassium: 4.2 mmol/L (ref 3.5–5.2)
Sodium: 143 mmol/L (ref 134–144)
Total Protein: 6.2 g/dL (ref 6.0–8.5)

## 2019-04-04 LAB — CBC WITH DIFFERENTIAL/PLATELET
Basophils Absolute: 0 10*3/uL (ref 0.0–0.2)
Basos: 1 %
EOS (ABSOLUTE): 0.1 10*3/uL (ref 0.0–0.4)
Eos: 2 %
Hematocrit: 39.4 % (ref 34.0–46.6)
Hemoglobin: 13.4 g/dL (ref 11.1–15.9)
Immature Grans (Abs): 0 10*3/uL (ref 0.0–0.1)
Immature Granulocytes: 0 %
Lymphocytes Absolute: 2.5 10*3/uL (ref 0.7–3.1)
Lymphs: 47 %
MCH: 29.8 pg (ref 26.6–33.0)
MCHC: 34 g/dL (ref 31.5–35.7)
MCV: 88 fL (ref 79–97)
Monocytes Absolute: 0.4 10*3/uL (ref 0.1–0.9)
Monocytes: 7 %
Neutrophils Absolute: 2.2 10*3/uL (ref 1.4–7.0)
Neutrophils: 43 %
Platelets: 237 10*3/uL (ref 150–450)
RBC: 4.5 x10E6/uL (ref 3.77–5.28)
RDW: 12.5 % (ref 11.7–15.4)
WBC: 5.2 10*3/uL (ref 3.4–10.8)

## 2019-04-04 LAB — LIPID PANEL
Chol/HDL Ratio: 4.5 ratio — ABNORMAL HIGH (ref 0.0–4.4)
Cholesterol, Total: 219 mg/dL — ABNORMAL HIGH (ref 100–199)
HDL: 49 mg/dL (ref >39)
LDL Chol Calc (NIH): 131 mg/dL — ABNORMAL HIGH (ref 0–99)
Triglycerides: 219 mg/dL — ABNORMAL HIGH (ref 0–149)
VLDL Cholesterol Cal: 39 mg/dL (ref 5–40)

## 2019-04-04 LAB — SAR COV2 SEROLOGY (COVID19)AB(IGG),IA: DiaSorin SARS-CoV-2 Ab, IgG: NEGATIVE

## 2019-04-04 LAB — TSH: TSH: 2.64 u[IU]/mL (ref 0.450–4.500)

## 2019-04-04 NOTE — Telephone Encounter (Signed)
-----   Message from [name]., MD sent at 04/04/2019 11:05 AM EST ----- Labs stable, no Covid antibodies.

## 2019-04-04 NOTE — Telephone Encounter (Signed)
Called to advise patient on lab results, left message for call back. If patient calls back it is okay for someone else to give results.

## 2019-04-05 ENCOUNTER — Telehealth: Payer: Self-pay | Admitting: *Deleted

## 2019-04-05 NOTE — Telephone Encounter (Signed)
Patient was advised.  

## 2019-04-05 NOTE — Telephone Encounter (Signed)
-----   Message from [name]., MD sent at 04/04/2019 11:05 AM EST ----- Labs stable, no Covid antibodies.

## 2019-04-05 NOTE — Telephone Encounter (Signed)
LMOVM for pt to return call. Also advised results are available on mychart.

## 2019-04-24 ENCOUNTER — Ambulatory Visit
Admission: RE | Admit: 2019-04-24 | Discharge: 2019-04-24 | Disposition: A | Discharge status: Home / Self Care | Payer: Managed Care, Other (non HMO) | Source: Ambulatory Visit | Attending: Family Medicine | Admitting: Family Medicine

## 2019-04-24 DIAGNOSIS — Z1231 Encounter for screening mammogram for malignant neoplasm of breast: Secondary | ICD-10-CM | POA: Diagnosis present

## 2019-04-29 ENCOUNTER — Other Ambulatory Visit: Payer: Self-pay | Admitting: Obstetrics and Gynecology

## 2019-04-29 DIAGNOSIS — N941 Unspecified dyspareunia: Secondary | ICD-10-CM

## 2019-07-10 ENCOUNTER — Other Ambulatory Visit: Payer: Self-pay | Admitting: Family Medicine

## 2019-08-10 ENCOUNTER — Other Ambulatory Visit: Payer: Self-pay | Admitting: Family Medicine

## 2019-08-10 NOTE — Telephone Encounter (Signed)
Requested Prescriptions  Pending Prescriptions Disp Refills   metoprolol succinate (TOPROL-XL) 50 MG 24 hr tablet [Pharmacy Med Name: METOPROLOL SUCC ER 50MG  TABLET] 90 tablet 0    Sig: TAKE 1 TABLET BY MOUTH  DAILY WITH OR IMMEDIATLEY  FOLLOWING A MEAL     Cardiovascular:  Beta Blockers Passed - 08/10/2019  9:25 PM      Passed - Last BP in normal range    BP Readings from Last 1 Encounters:  04/01/19 136/84         Passed - Last Heart Rate in normal range    Pulse Readings from Last 1 Encounters:  04/01/19 95         Passed - Valid encounter within last 6 months    Recent Outpatient Visits          4 months ago Annual physical exam   [facility] [name]., MD   8 months ago Low back pain, unspecified back pain laterality, unspecified chronicity, unspecified whether sciatica present   [facility] [name]., MD   8 months ago RUQ abdominal pain   [facility] [name]., MD   1 year ago Annual physical exam   [facility] [name]., MD   2 years ago Paroxysmal tachycardia [facility])   [facility] [name]., MD      Future Appointments            In 1 month Gollan, Kathlene November, MD [facility]   In 7 months [name]., MD [facility], [place]

## 2019-09-04 ENCOUNTER — Ambulatory Visit: Payer: Managed Care, Other (non HMO) | Admitting: Family Medicine

## 2019-09-04 ENCOUNTER — Encounter: Payer: Self-pay | Admitting: Family Medicine

## 2019-09-04 ENCOUNTER — Other Ambulatory Visit: Payer: Self-pay

## 2019-09-04 VITALS — BP 110/70 | HR 90 | Temp 97.5°F | Resp 15 | Ht 68.0 in | Wt 175.0 lb

## 2019-09-04 DIAGNOSIS — Z87898 Personal history of other specified conditions: Secondary | ICD-10-CM | POA: Diagnosis not present

## 2019-09-04 DIAGNOSIS — R2240 Localized swelling, mass and lump, unspecified lower limb: Secondary | ICD-10-CM

## 2019-09-04 DIAGNOSIS — E559 Vitamin D deficiency, unspecified: Secondary | ICD-10-CM

## 2019-09-04 NOTE — Progress Notes (Signed)
Established patient visit  I,[name],acting as a scribe for [name], MD.,have documented all relevant documentation on the behalf of [name], MD,as directed by  [name], MD while in the presence of [name], MD.   Patient: Hannah Gonzalez   DOB: [birth year]   59 y.o. Female  MRN: [id number] Visit Date: 09/04/2019  Today's healthcare provider: [name], MD   Chief Complaint  Patient presents with  . lumps   Subjective    HPI HPI    x2 on lower left leg   Last edited by Julieta Bellini, CMA on 09/04/2019  4:14 PM. (History)      Patient is here concerning 2 lumps she found on her left lower leg 1 1/2 weeks ago. Patient states lumps are sensitive to the touch, but are not painful.  Overall she feels well and has not had no complaints.  She is ready to retire from work after 40 years with [employer].      Medications: Outpatient Medications Prior to Visit  Medication Sig  . Cetirizine HCl 10 MG CAPS Take 10 mg by mouth daily.   . Cholecalciferol (VITAMIN D3) 1000 units CAPS Take 1,000 Units by mouth daily.   Marland Kitchen CRANBERRY PO Take 1 capsule by mouth daily.  . Menthol, Topical Analgesic, (BLUE-EMU MAXIMUM STRENGTH EX) Apply 1 application topically 2 (two) times daily.  . metoprolol succinate (TOPROL-XL) 50 MG 24 hr tablet TAKE 1 TABLET BY MOUTH  DAILY WITH OR IMMEDIATLEY  FOLLOWING A MEAL  . Multiple Vitamins-Minerals (ONE-A-DAY WOMENS 50 PLUS PO) Take 1 tablet by mouth daily.  . Omega-3 Fatty Acids (FISH OIL) 1200 MG CAPS Take 1,200 mg by mouth every other day. Alternating days with flaxseed oil  . OSPHENA 60 MG TABS TAKE 1 TABLET BY MOUTH DAILY  . tolterodine (DETROL LA) 4 MG 24 hr capsule TAKE 1 CAPSULE BY MOUTH  DAILY  . cyclobenzaprine (FLEXERIL) 10 MG tablet Take 1 tablet (10 mg total) by mouth 3 (three) times daily as needed for muscle spasms. (Patient not taking: Reported on 04/01/2019)  . Flaxseed MISC Take 1,000 mg  by mouth every other day. Alternating days with the fish oil (Patient not taking: Reported on 09/04/2019)  . predniSONE (STERAPRED UNI-PAK 21 TAB) 10 MG (21) TBPK tablet Taper as directed. (Patient not taking: Reported on 04/01/2019)   No facility-administered medications prior to visit.    Review of Systems  Constitutional: Negative for appetite change, chills, fatigue and fever.  Respiratory: Negative for chest tightness and shortness of breath.   Cardiovascular: Negative for chest pain and palpitations.  Gastrointestinal: Negative for abdominal pain, nausea and vomiting.  Musculoskeletal: Negative.   Neurological: Negative for dizziness and weakness.  Psychiatric/Behavioral: Negative.        Objective    BP 110/70 (BP Location: Left Arm, Patient Position: Sitting, Cuff Size: Large)   Pulse 90   Temp (!) 97.5 F (36.4 C) (Other (Comment))   Resp 15   Ht 5\' 8"  (1.727 m)   Wt 175 lb (79.4 kg)   SpO2 96%   BMI 26.61 kg/m  BP Readings from Last 3 Encounters:  09/04/19 110/70  04/01/19 136/84  12/11/18 112/72   Wt Readings from Last 3 Encounters:  09/04/19 175 lb (79.4 kg)  04/01/19 174 lb (78.9 kg)  12/11/18 176 lb (79.8 kg)      Physical Exam Vitals reviewed.  Constitutional:      Appearance:  Normal appearance.  Cardiovascular:     Rate and Rhythm: Regular rhythm.     Heart sounds: Normal heart sounds.  Pulmonary:     Effort: Pulmonary effort is normal.     Breath sounds: Normal breath sounds.  Abdominal:     Palpations: Abdomen is soft.     Comments: There is no inguinal adenopathy.  Skin:    General: Skin is warm and dry.     Findings: Lesion present.     Comments: 2 small lesions on the left leg.  Anterior leg just above the tibia.  Other in the medial calf.  They are subdermal  nodules.  Neurological:     General: No focal deficit present.     Mental Status: She is alert and oriented to person, place, and time.  Psychiatric:        Mood and Affect: Mood  normal.        Behavior: Behavior normal.        Thought Content: Thought content normal.        Judgment: Judgment normal.       No results found for any visits on 09/04/19.  Assessment & Plan     1. Nodule of skin of lower extremity Uncertain etiology.  Erythema nodosum versus radiology.  Rule out subcutaneous lymphoma - Ambulatory referral to Dermatology  2. Avitaminosis D   3. H/O thymoma    No follow-ups on file.      I, [name], MD, have reviewed all documentation for this visit. The documentation on 09/06/19 for the exam, diagnosis, procedures, and orders are all accurate and complete.    Kajuan Guyton Cranford Mon, MD  [facility] [phone] (phone) [phone] (fax)  [place]

## 2019-09-05 ENCOUNTER — Other Ambulatory Visit: Payer: Self-pay

## 2019-09-05 ENCOUNTER — Ambulatory Visit: Payer: Managed Care, Other (non HMO) | Admitting: Dermatology

## 2019-09-05 ENCOUNTER — Other Ambulatory Visit: Payer: Self-pay | Admitting: Dermatology

## 2019-09-05 ENCOUNTER — Encounter: Payer: Self-pay | Admitting: Dermatology

## 2019-09-05 DIAGNOSIS — D492 Neoplasm of unspecified behavior of bone, soft tissue, and skin: Secondary | ICD-10-CM

## 2019-09-05 DIAGNOSIS — D485 Neoplasm of uncertain behavior of skin: Secondary | ICD-10-CM

## 2019-09-05 NOTE — Progress Notes (Signed)
   Follow-Up Visit   Subjective  Hannah Gonzalez is a 59 y.o. female who presents for the following: nodules (x 2, L leg, pt is not sure how long they have been there, they are sensitive).  The following portions of the chart were reviewed this encounter and updated as appropriate:  Tobacco  Allergies  Meds  Problems  Med Hx  Surg Hx  Fam Hx     Review of Systems:  No other skin or systemic complaints except as noted in HPI or Assessment and Plan.  Objective  Well appearing patient in no apparent distress; mood and affect are within normal limits.  A focused examination was performed including Left lower leg. Relevant physical exam findings are noted in the Assessment and Plan.  Objective  L medial distal pretibial, L distal medial calf: Slightly tender deep nodules L medial distal pretibial 1.5cm, L distal medial calf 1.0cm   Assessment & Plan    Neoplasm of skin L medial distal pretibial, L distal medial calf DDx: Erythema nodosum versus erythema induratum versus angiolipoma versus lipoma versus lipodermatosclerosis  Skin / nail biopsy Type of biopsy: punch   Informed consent: discussed and consent obtained   Timeout: patient name, date of birth, surgical site, and procedure verified   Procedure prep:  Patient was prepped and draped in usual sterile fashion (The patient was cleaned and prepped) Prep type:  Isopropyl alcohol Anesthesia: the lesion was anesthetized in a standard fashion   Anesthetic:  1% lidocaine w/ epinephrine 1-100,000 buffered w/ 8.4% NaHCO3 Punch size:  4 mm Suture size:  4-0 Suture type: nylon   Hemostasis achieved with: suture, pressure and aluminum chloride   Outcome: patient tolerated procedure well   Post-procedure details: sterile dressing applied and wound care instructions given   Dressing type: bandage, pressure dressing and petrolatum   Additional details:  Suture x 1 today  Lipoma vs Angio Lipoma vs Erythema Nodosum vs  Lipodermatosclerosis vs other  Other Related Procedures Anatomic Pathology Report  Return in about 1 week (around 09/12/2019) for surture removal.   I, Othelia Pulling, RMA, am acting as scribe for Sarina Ser, MD .  Documentation: I have reviewed the above documentation for accuracy and completeness, and I agree with the above.  Sarina Ser, MD

## 2019-09-05 NOTE — Patient Instructions (Signed)

## 2019-09-08 ENCOUNTER — Encounter: Payer: Self-pay | Admitting: Dermatology

## 2019-09-12 ENCOUNTER — Other Ambulatory Visit: Payer: Self-pay

## 2019-09-12 ENCOUNTER — Ambulatory Visit (INDEPENDENT_AMBULATORY_CARE_PROVIDER_SITE_OTHER): Payer: Managed Care, Other (non HMO) | Admitting: Dermatology

## 2019-09-12 DIAGNOSIS — D485 Neoplasm of uncertain behavior of skin: Secondary | ICD-10-CM

## 2019-09-12 DIAGNOSIS — Z4802 Encounter for removal of sutures: Secondary | ICD-10-CM

## 2019-09-12 NOTE — Progress Notes (Signed)
   Follow-Up Visit   Subjective  Hannah Gonzalez is a 59 y.o. female who presents for the following: suture removal (patient is here today for suture removal and to discuss pathology results and treatment options).  The following portions of the chart were reviewed this encounter and updated as appropriate:  Tobacco  Allergies  Meds  Problems  Med Hx  Surg Hx  Fam Hx     Review of Systems:  No other skin or systemic complaints except as noted in HPI or Assessment and Plan.  Objective  Well appearing patient in no apparent distress; mood and affect are within normal limits.  A focused examination was performed including the left leg. Relevant physical exam findings are noted in the Assessment and Plan.  Objective  L pretibial: Healing punch biopsy site  Assessment & Plan    Neoplasm of uncertain behavior of skin L pretibial  Encounter for Removal of Sutures - Incision site at the L pretibial is clean, dry and intact - Wound cleansed, sutures removed, wound cleansed and steri strips applied.  - When pathology results are available the patient will be contacted  - Patient advised to keep steri-strips dry until they fall off. - Scars remodel for a full year. - Once steri-strips fall off, patient can apply over-the-counter silicone scar cream each night to help with scar remodeling if desired. - Patient advised to call with any concerns or if they notice any new or changing lesions.   Return if symptoms worsen or fail to improve, for will contact patient once her pathology results are available.  Luther Redo, CMA, am acting as scribe for Sarina Ser, MD .  Documentation: I have reviewed the above documentation for accuracy and completeness, and I agree with the above.  Sarina Ser, MD

## 2019-09-13 LAB — ANATOMIC PATHOLOGY REPORT

## 2019-09-15 ENCOUNTER — Encounter: Payer: Self-pay | Admitting: Dermatology

## 2019-09-19 ENCOUNTER — Telehealth: Payer: Self-pay

## 2019-09-19 NOTE — Telephone Encounter (Signed)
Patient advised of biopsy results through [facility]. I spoke with patient this morning after she called office and scheduled her for the 6-8 week follow up.

## 2019-10-08 ENCOUNTER — Ambulatory Visit: Payer: Managed Care, Other (non HMO) | Admitting: Cardiovascular Disease

## 2019-10-08 ENCOUNTER — Other Ambulatory Visit: Payer: Self-pay

## 2019-10-08 ENCOUNTER — Encounter: Payer: Self-pay | Admitting: Cardiovascular Disease

## 2019-10-08 VITALS — BP 132/84 | HR 84 | Ht 68.0 in | Wt 175.0 lb

## 2019-10-08 DIAGNOSIS — I479 Paroxysmal tachycardia, unspecified: Secondary | ICD-10-CM

## 2019-10-08 DIAGNOSIS — E7800 Pure hypercholesterolemia, unspecified: Secondary | ICD-10-CM | POA: Diagnosis not present

## 2019-10-08 DIAGNOSIS — I10 Essential (primary) hypertension: Secondary | ICD-10-CM

## 2019-10-08 NOTE — Progress Notes (Signed)
Cardiology Office Note  Date:  10/08/2019   ID:  Hannah Gonzalez, DOB 04-29-1960, MRN [id number]  PCP:  [name]., MD   Chief Complaint  Patient presents with  . Follow-up    1 Year follow up, per patient she has no concerns today. Medications verbally reviewed with patient.     HPI:  Hannah Gonzalez is a 59 year old woman with history of  Paroxysmal tachycardia, arrhythmia dating back to when she was young, improved on metoprolol Hyperlipidemia Family hx of high cholesterol, mom is 32 yo no coronary calcification, no aortic atherosclerosis on CT scans 2015 and 2016 who presents for evaluation of her tachycardia.  And hyperlipidemia  Last seen in clinic July 2020 At that time had completed back surgery, hip replacement surgery  Works at [employer], significant stress at work Exhausted in general Elderly mom, 17 years old, lives next door, requires much of her time Takes care of mom when getting home  Tolerating metoprolol 50 daily, denies any significant tachycardia   total cholesterol 240 Prefer not to be on medications  Prior CT scan chest images pulled up, no significant coronary calcification noted or aortic atherosclerosis  EKG personally reviewed by myself on todays visit Shows normal sinus rhythm with rate 84 bpm no significant ST or T wave changes  Other past medical history reviewed 30 day monitor reviewed with her showing normal sinus rhythm, no significant arrhythmia, average heart rate 80-90 bpm  Previous labs reviewed personally by myself and with the patient on todays visit total cholesterol 235, LDL 150  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 Faith Rogue   mother has history of A. fib, father with pacemaker, died of a stroke Both parents have very high cholesterol   PMH:   has a past medical history of Arthritis, Basal cell carcinoma (05/04/2001), basal cell carcinoma (05/04/2001), dysplastic nevus (02/23/2009),  dysplastic nevus (11/23/2009), Palpitations, and Vitamin D deficiency.  PSH:    Past Surgical History:  Procedure Laterality Date  . ANTERIOR CRUCIATE LIGAMENT REPAIR    . ARTHROSCOPIC REPAIR ACL     left  . BACK SURGERY    . BIOPSY BREAST     left   . BREAST BIOPSY Left 2010   benign-core  . LAPAROSCOPY    . MYOMECTOMY    . PAROTIDECTOMY    . THYMECTOMY    . TONSILLECTOMY    . TOTAL HIP ARTHROPLASTY Right 01/10/2018   Procedure: TOTAL HIP ARTHROPLASTY ANTERIOR APPROACH;  Surgeon: Lovell Sheehan, MD;  Location: [facility] ORS;  Service: Orthopedics;  Laterality: Right;    Current Outpatient Medications  Medication Sig Dispense Refill  . Cetirizine HCl 10 MG CAPS Take 10 mg by mouth daily.     . Cholecalciferol (VITAMIN D3) 1000 units CAPS Take 1,000 Units by mouth daily.     Marland Kitchen CRANBERRY PO Take 1 capsule by mouth daily.    . Menthol, Topical Analgesic, (BLUE-EMU MAXIMUM STRENGTH EX) Apply 1 application topically 2 (two) times daily.    . metoprolol succinate (TOPROL-XL) 50 MG 24 hr tablet TAKE 1 TABLET BY MOUTH  DAILY WITH OR IMMEDIATLEY  FOLLOWING A MEAL 90 tablet 0  . Multiple Vitamins-Minerals (ONE-A-DAY WOMENS 50 PLUS PO) Take 1 tablet by mouth daily.    . Omega-3 Fatty Acids (FISH OIL) 1200 MG CAPS Take 1,200 mg by mouth every other day. Alternating days with flaxseed oil    . OSPHENA 60 MG TABS TAKE 1 TABLET BY  MOUTH DAILY 30 tablet 11  . tolterodine (DETROL LA) 4 MG 24 hr capsule TAKE 1 CAPSULE BY MOUTH  DAILY 90 capsule 3   No current facility-administered medications for this visit.     Allergies:   Aspirin   Social History:  The patient  reports that she has never smoked. She has never used smokeless tobacco. She reports that she does not drink alcohol and does not use drugs.   Family History:   family history includes Arrhythmia in her mother; Arthritis in her father and mother; Breast cancer in her cousin and maternal aunt; Cancer in her maternal aunt and mother;  Heart disease in her mother; Heart failure in her mother; Hyperlipidemia in her brother and mother; Hypertension in her brother, father, and mother; Prostate cancer in her father.    Review of Systems: Review of Systems  Constitutional: Negative.   HENT: Negative.   Respiratory: Negative.   Cardiovascular: Negative.   Gastrointestinal: Negative.   Musculoskeletal: Negative.   Neurological: Negative.   Psychiatric/Behavioral: The patient is nervous/anxious.   All other systems reviewed and are negative.   PHYSICAL EXAM: VS:  BP 132/84 (BP Location: Left Arm, Patient Position: Sitting, Cuff Size: Normal)   Pulse 84   Ht 5\' 8"  (1.727 m)   Wt 175 lb (79.4 kg)   SpO2 97%   BMI 26.61 kg/m  , BMI Body mass index is 26.61 kg/m. Constitutional:  oriented to person, place, and time. No distress.  HENT:  Head: Grossly normal Eyes:  no discharge. No scleral icterus.  Neck: No JVD, no carotid bruits  Cardiovascular: Regular rate and rhythm, no murmurs appreciated Pulmonary/Chest: Clear to auscultation bilaterally, no wheezes or rails Abdominal: Soft.  no distension.  no tenderness.  Musculoskeletal: Normal range of motion Neurological:  normal muscle tone. Coordination normal. No atrophy Skin: Skin warm and dry Psychiatric: normal affect, pleasant   Recent Labs: 04/03/2019: ALT 18; BUN 12; Creatinine, Ser 0.86; Hemoglobin 13.4; Platelets 237; Potassium 4.2; Sodium 143; TSH 2.640    Lipid Panel Lab Results  Component Value Date   CHOL 219 (H) 04/03/2019   HDL 49 04/03/2019   LDLCALC 131 (H) 04/03/2019   TRIG 219 (H) 04/03/2019      Wt Readings from Last 3 Encounters:  10/08/19 175 lb (79.4 kg)  09/04/19 175 lb (79.4 kg)  04/01/19 174 lb (78.9 kg)      ASSESSMENT AND PLAN:  Tachycardia - Plan: EKG 12-Lead Denies having significant paroxysmal tachycardia on metoprolol at current dose Blood pressure also well controlled  Mixed hyperlipidemia - Plan: EKG 12-Lead No  significant coronary calcification on prior CT scans No aortic atherosclerosis on imaging Cholesterol has come down, numbers discussed Discussed a CT coronary calcium scoring if she would like She will think about it, prefers not to be on medication at this time  Essential hypertension - Plan: EKG 12-Lead Blood pressure is well controlled on today's visit. No changes made to the medications.  Stress/adjustment disorder Significant stress at work, long days, works at [employer] Also with stress at home, taking care of elderly mother 16 years old who lives next door    Total encounter time more than 25 minutes  Greater than 50% was spent in counseling and coordination of care with the patient     No orders of the defined types were placed in this encounter.    Signed, Esmond Plants, M.D., Ph.D. 10/08/2019  [place], [place]

## 2019-10-08 NOTE — Patient Instructions (Signed)

## 2019-11-06 ENCOUNTER — Other Ambulatory Visit: Payer: Self-pay

## 2019-11-06 ENCOUNTER — Ambulatory Visit: Payer: Managed Care, Other (non HMO) | Admitting: Dermatology

## 2019-11-06 ENCOUNTER — Encounter: Payer: Self-pay | Admitting: Dermatology

## 2019-11-06 DIAGNOSIS — I8312 Varicose veins of left lower extremity with inflammation: Secondary | ICD-10-CM

## 2019-11-06 NOTE — Progress Notes (Signed)
   Follow-Up Visit   Subjective  Hannah Gonzalez is a 59 y.o. female who presents for the following: Lipodermatosclerosis bx proven (Left distal medial pretibial, L distal calf 8wk f/u, areas burn prn, itch prn).  The following portions of the chart were reviewed this encounter and updated as appropriate:  Tobacco  Allergies  Meds  Problems  Med Hx  Surg Hx  Fam Hx     Review of Systems:  No other skin or systemic complaints except as noted in HPI or Assessment and Plan.  Objective  Well appearing patient in no apparent distress; mood and affect are within normal limits.  A focused examination was performed including L pretibial. Relevant physical exam findings are noted in the Assessment and Plan.  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  Intralesional injection - L pretibial, L calf Location: L distal medial pretibial, L distal calf  Informed Consent: Discussed risks (infection, pain, bleeding, bruising, thinning of the skin, loss of skin pigment, lack of resolution, and recurrence of lesion) and benefits of the procedure, as well as the alternatives. Informed consent was obtained. Preparation: The area was prepared a standard fashion.  Anesthesia:none  Procedure Details: An intralesional injection was performed with Kenalog 2.5 mg/cc. 1.0 cc in total were injected.  Total number of  injections: 4  Plan: The patient was instructed on post-op care. Recommend OTC analgesia as needed for pain.   Return in about 6 weeks (around 12/18/2019) for f/u Dermatosclerosis IL injections.   I, Othelia Pulling, RMA, am acting as scribe for Sarina Ser, MD .  Documentation: I have reviewed the above documentation for accuracy and completeness, and I agree with the above.  Sarina Ser, MD

## 2019-11-14 ENCOUNTER — Other Ambulatory Visit: Payer: Self-pay | Admitting: Family Medicine

## 2019-11-14 ENCOUNTER — Telehealth: Payer: Self-pay

## 2019-11-14 MED ORDER — METOPROLOL SUCCINATE ER 50 MG PO TB24: ORAL_TABLET | ORAL | 1 refills | Status: DC

## 2019-11-14 NOTE — Telephone Encounter (Signed)
Copied from [place] [id number]. Topic: Quick Communication - See Telephone Encounter >> Nov 14, 2019  9:10 AM Hannah Gonzalez wrote: CRM for notification. See Telephone encounter for: 11/14/19.metoprolol succinate (TOPROL-XL) 50 MG 24 hr tablet Medication Date: 08/10/2019 Department: [facility] Ordering/Authorizing: [name]., MD  Pt has called in upset due to Pharmacy sstating that Dr . Darnell Level would not refill, last visit 7/14 CPE in Feb. Wants clarification, as does not feel correct. FU she says within the hr or leave her a detailed message [phone]

## 2019-11-14 NOTE — Telephone Encounter (Signed)
Sent refill to pharmacy. 

## 2019-12-23 ENCOUNTER — Other Ambulatory Visit: Payer: Self-pay

## 2019-12-23 ENCOUNTER — Encounter: Payer: Self-pay | Admitting: Dermatology

## 2019-12-23 ENCOUNTER — Ambulatory Visit: Payer: Managed Care, Other (non HMO) | Admitting: Dermatology

## 2019-12-23 DIAGNOSIS — D692 Other nonthrombocytopenic purpura: Secondary | ICD-10-CM | POA: Diagnosis not present

## 2019-12-23 DIAGNOSIS — I8312 Varicose veins of left lower extremity with inflammation: Secondary | ICD-10-CM

## 2019-12-23 NOTE — Progress Notes (Signed)
   Follow-Up Visit   Subjective  Hannah Gonzalez is a 59 y.o. female who presents for the following: Follow-up.  Patient here today for 6 week follow up for lipodermatosclerosis of left lower leg. Patient had IL injections to 2 sites at last appt and is wearing compression socks. She has not noticed much improvement following injections and advises there is a new spot that is sore. She does have bruising from injections that is still present at one of the sites.   The following portions of the chart were reviewed this encounter and updated as appropriate:  Tobacco  Allergies  Meds  Problems  Med Hx  Surg Hx  Fam Hx     Review of Systems:  No other skin or systemic complaints except as noted in HPI or Assessment and Plan.  Objective  Well appearing patient in no apparent distress; mood and affect are within normal limits.  A focused examination was performed including lower left leg. Relevant physical exam findings are noted in the Assessment and Plan.  Objective  Left Lower Leg: L distal calf mostly resolved with purpura L distal medial pretibial 1.5cm indurated tender plaques L distal medial pretibial distal to original 1.0cm indurated tender plaques   Assessment & Plan  Lipodermatosclerosis of left lower extremity with Stasis changes (Chronic condition that can flare) Persistence , but improvement of tender nodule of Left medial pretibial site + new lesion inferior to it. Purpura, but improvement and resolution at left calf site. Left Lower Leg Will send order in to start therapeutic  Ultrasound treatments to aa. Continue Knee high graduated compression stockings.  Intralesional injection - Left Lower Leg Location: left distal medial pretibial x 2  Informed Consent: Discussed risks (infection, pain, bleeding, bruising, thinning of the skin, loss of skin pigment, lack of resolution, and recurrence of lesion) and benefits of the procedure, as well as the alternatives.  Informed consent was obtained. Preparation: The area was prepared a standard fashion.  Procedure Details: An intralesional injection was performed with Kenalog 2.5 mg/cc. 0.5 cc in total were injected.  Total number of injections: 4  Plan: The patient was instructed on post-op care. Recommend OTC analgesia as needed for pain.   Return in about 6 weeks (around 02/03/2020) for follow up.  Graciella Belton, RMA, am acting as scribe for Sarina Ser, MD . Documentation: I have reviewed the above documentation for accuracy and completeness, and I agree with the above.  Sarina Ser, MD

## 2020-01-13 ENCOUNTER — Other Ambulatory Visit: Payer: Self-pay

## 2020-01-13 ENCOUNTER — Ambulatory Visit: Payer: Managed Care, Other (non HMO) | Admitting: Physician Assistant

## 2020-01-13 ENCOUNTER — Encounter: Payer: Self-pay | Admitting: Physician Assistant

## 2020-01-13 VITALS — BP 150/97 | HR 104 | Temp 98.3°F | Resp 16 | Wt 176.3 lb

## 2020-01-13 DIAGNOSIS — R3989 Other symptoms and signs involving the genitourinary system: Secondary | ICD-10-CM | POA: Diagnosis not present

## 2020-01-13 DIAGNOSIS — R300 Dysuria: Secondary | ICD-10-CM | POA: Diagnosis not present

## 2020-01-13 LAB — POCT URINALYSIS DIPSTICK
Bilirubin, UA: NEGATIVE
Blood, UA: NEGATIVE
Glucose, UA: NEGATIVE
Ketones, UA: NEGATIVE
Nitrite, UA: NEGATIVE
Protein, UA: NEGATIVE
Spec Grav, UA: 1.015 (ref 1.010–1.025)
Urobilinogen, UA: 0.2 E.U./dL
pH, UA: 6 (ref 5.0–8.0)

## 2020-01-13 MED ORDER — SULFAMETHOXAZOLE-TRIMETHOPRIM 800-160 MG PO TABS: 1.0000 | ORAL_TABLET | Freq: Two times a day (BID) | ORAL | 0 refills | Status: DC

## 2020-01-13 NOTE — Patient Instructions (Signed)
Urinary Tract Infection, Adult A urinary tract infection (UTI) is an infection of any part of the urinary tract. The urinary tract includes:  The kidneys.  The ureters.  The bladder.  The urethra. These organs make, store, and get rid of pee (urine) in the body. What are the causes? This is caused by germs (bacteria) in your genital area. These germs grow and cause swelling (inflammation) of your urinary tract. What increases the risk? You are more likely to develop this condition if:  You have a small, thin tube (catheter) to drain pee.  You cannot control when you pee or poop (incontinence).  You are female, and: ? You use these methods to prevent pregnancy:  A medicine that kills sperm (spermicide).  A device that blocks sperm (diaphragm). ? You have low levels of a female hormone (estrogen). ? You are pregnant.  You have genes that add to your risk.  You are sexually active.  You take antibiotic medicines.  You have trouble peeing because of: ? A prostate that is bigger than normal, if you are female. ? A blockage in the part of your body that drains pee from the bladder (urethra). ? A kidney stone. ? A nerve condition that affects your bladder (neurogenic bladder). ? Not getting enough to drink. ? Not peeing often enough.  You have other conditions, such as: ? Diabetes. ? A weak disease-fighting system (immune system). ? Sickle cell disease. ? Gout. ? Injury of the spine. What are the signs or symptoms? Symptoms of this condition include:  Needing to pee right away (urgently).  Peeing often.  Peeing small amounts often.  Pain or burning when peeing.  Blood in the pee.  Pee that smells bad or not like normal.  Trouble peeing.  Pee that is cloudy.  Fluid coming from the vagina, if you are female.  Pain in the belly or lower back. Other symptoms include:  Throwing up (vomiting).  No urge to eat.  Feeling mixed up (confused).  Being tired  and grouchy (irritable).  A fever.  Watery poop (diarrhea). How is this treated? This condition may be treated with:  Antibiotic medicine.  Other medicines.  Drinking enough water. Follow these instructions at home:  Medicines  Take over-the-counter and prescription medicines only as told by your doctor.  If you were prescribed an antibiotic medicine, take it as told by your doctor. Do not stop taking it even if you start to feel better. General instructions  Make sure you: ? Pee until your bladder is empty. ? Do not hold pee for a long time. ? Empty your bladder after sex. ? Wipe from front to back after pooping if you are a female. Use each tissue one time when you wipe.  Drink enough fluid to keep your pee pale yellow.  Keep all follow-up visits as told by your doctor. This is important. Contact a doctor if:  You do not get better after 1-2 days.  Your symptoms go away and then come back. Get help right away if:  You have very bad back pain.  You have very bad pain in your lower belly.  You have a fever.  You are sick to your stomach (nauseous).  You are throwing up. Summary  A urinary tract infection (UTI) is an infection of any part of the urinary tract.  This condition is caused by germs in your genital area.  There are many risk factors for a UTI. These include having a small, thin   tube to drain pee and not being able to control when you pee or poop.  Treatment includes antibiotic medicines for germs.  Drink enough fluid to keep your pee pale yellow. This information is not intended to replace advice given to you by your health care provider. Make sure you discuss any questions you have with your health care provider. Document Revised: 01/25/2018 Document Reviewed: 08/17/2017 Elsevier Patient Education  2020 [employer]  

## 2020-01-13 NOTE — Progress Notes (Signed)
Established patient visit   Patient: Hannah Gonzalez   DOB: [birth year]   59 y.o. Female  MRN: [id number] Visit Date: 01/13/2020  Today's healthcare provider: Mar Daring, PA-C   Chief Complaint  Patient presents with  . Dysuria   Subjective    Dysuria  This is a new problem. The current episode started 1 to 4 weeks ago. The problem occurs every urination. The problem has been unchanged. Quality: "needles" some burning a few days ago. The patient is experiencing no pain. There has been no fever. Associated symptoms include frequency and urgency. Pertinent negatives include no discharge or hematuria. Associated symptoms comments: Not emptying her bladder all the way. She has tried increased fluids (Cranberry juice) for the symptoms. There is no history of kidney stones or recurrent UTIs.     Patient Active Problem List   Diagnosis Date Noted  . Osteoarthritis of right hip 01/10/2018  . Paroxysmal tachycardia ([facility]) 06/21/2017  . Chronic thoracic back pain 06/21/2017  . Chronic pain of right hip 06/21/2017  . Lumbar radiculopathy 07/13/2016  . Pain of both hip joints 07/12/2016  . Avitaminosis D 08/26/2014  . Anxiety, generalized 08/26/2014  . Acid reflux 08/26/2014  . Hypercholesteremia 08/26/2014  . Essential hypertension 08/26/2014  . 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 Tachycardia 05/23/2014  . Mixed hyperlipidemia 05/23/2014  . H/O thymoma 05/23/2014   Past Medical History:  Diagnosis Date  . Arthritis   . Basal cell carcinoma 05/04/2001   Basal cell left thigh  . Hx of basal cell carcinoma 05/04/2001   L sup. ant. thigh  . Hx of dysplastic nevus 02/23/2009   Left buttocks, moderate  . Hx of dysplastic nevus 11/23/2009   Right posterior superior thigh  . Palpitations   . Vitamin D deficiency        Medications: Outpatient Medications Prior to Visit  Medication Sig  . Cetirizine  HCl 10 MG CAPS Take 10 mg by mouth daily.   . Cholecalciferol (VITAMIN D3) 1000 units CAPS Take 1,000 Units by mouth daily.   Marland Kitchen CRANBERRY PO Take 1 capsule by mouth daily.  . Menthol, Topical Analgesic, (BLUE-EMU MAXIMUM STRENGTH EX) Apply 1 application topically 2 (two) times daily.  . metoprolol succinate (TOPROL-XL) 50 MG 24 hr tablet TAKE 1 TABLET BY MOUTH  DAILY WITH OR IMMEDIATLEY  FOLLOWING A MEAL  . Multiple Vitamins-Minerals (ONE-A-DAY WOMENS 50 PLUS PO) Take 1 tablet by mouth daily.  . Omega-3 Fatty Acids (FISH OIL) 1200 MG CAPS Take 1,200 mg by mouth every other day. Alternating days with flaxseed oil  . OSPHENA 60 MG TABS TAKE 1 TABLET BY MOUTH DAILY  . tolterodine (DETROL LA) 4 MG 24 hr capsule TAKE 1 CAPSULE BY MOUTH  DAILY   No facility-administered medications prior to visit.    Review of Systems  Constitutional: Negative for fever.  Respiratory: Negative.   Cardiovascular: Negative.   Gastrointestinal: Negative for abdominal pain.  Genitourinary: Positive for dysuria, frequency and urgency. Negative for hematuria.    Last CBC Lab Results  Component Value Date   WBC 5.2 04/03/2019   HGB 13.4 04/03/2019   HCT 39.4 04/03/2019   MCV 88 04/03/2019   MCH 29.8 04/03/2019   RDW 12.5 04/03/2019   PLT 237 95/18/8416   Last metabolic panel Lab Results  Component Value Date   GLUCOSE 89 04/03/2019   NA 143 04/03/2019   K 4.2 04/03/2019  CL 103 04/03/2019   CO2 24 04/03/2019   BUN 12 04/03/2019   CREATININE 0.86 04/03/2019   GFRNONAA 74 04/03/2019   GFRAA 86 04/03/2019   CALCIUM 9.1 04/03/2019   PROT 6.2 04/03/2019   ALBUMIN 4.2 04/03/2019   LABGLOB 2.0 04/03/2019   AGRATIO 2.1 04/03/2019   BILITOT 0.3 04/03/2019   ALKPHOS 59 04/03/2019   AST 21 04/03/2019   ALT 18 04/03/2019   ANIONGAP 11 01/23/2018      Objective    BP (!) 150/97 (BP Location: Right Arm, Patient Position: Sitting, Cuff Size: Large)   Pulse (!) 104   Temp 98.3 F (36.8 C) (Oral)    Resp 16   Wt 176 lb 4.8 oz (80 kg)   BMI 26.81 kg/m  BP Readings from Last 3 Encounters:  01/13/20 (!) 150/97  10/08/19 132/84  09/04/19 110/70   Wt Readings from Last 3 Encounters:  01/13/20 176 lb 4.8 oz (80 kg)  10/08/19 175 lb (79.4 kg)  09/04/19 175 lb (79.4 kg)      Physical Exam Vitals reviewed.  Constitutional:      General: She is not in acute distress.    Appearance: Normal appearance. She is well-developed, well-groomed and overweight. She is not diaphoretic.  HENT:     Head: Normocephalic and atraumatic.  Cardiovascular:     Rate and Rhythm: Normal rate and regular rhythm.     Pulses: Normal pulses.     Heart sounds: Normal heart sounds. No murmur heard.  No friction rub. No gallop.   Pulmonary:     Effort: Pulmonary effort is normal. No respiratory distress.     Breath sounds: Normal breath sounds. No wheezing or rales.  Abdominal:     General: Abdomen is flat. Bowel sounds are normal. There is no distension.     Palpations: Abdomen is soft. There is no mass.     Tenderness: There is abdominal tenderness in the suprapubic area. There is no guarding or rebound.  Skin:    General: Skin is warm and dry.  Neurological:     Mental Status: She is alert and oriented to person, place, and time.  Psychiatric:        Behavior: Behavior is cooperative.      Results for orders placed or performed in visit on 01/13/20  POCT urinalysis dipstick  Result Value Ref Range   Color, UA Light Yellow    Clarity, UA Clear    Glucose, UA Negative Negative   Bilirubin, UA Negative    Ketones, UA Negative    Spec Grav, UA 1.015 1.010 - 1.025   Blood, UA Negative    pH, UA 6.0 5.0 - 8.0   Protein, UA Negative Negative   Urobilinogen, UA 0.2 0.2 or 1.0 E.U./dL   Nitrite, UA Negative    Leukocytes, UA Small (1+) (A) Negative   Appearance     Odor      Assessment & Plan     1. Dysuria UA abnormal. Culture sent as below.  - POCT urinalysis dipstick - Urine  Culture  2. Suspected UTI Worsening symptoms. UA positive. No systemic signs/symptoms. Will treat empirically with Bactrim as below.  Continue to push fluids. Urine sent for culture. Will follow up pending C&S results. She is to call if symptoms do not improve or if they worsen.  - sulfamethoxazole-trimethoprim (BACTRIM DS) 800-160 MG tablet; Take 1 tablet by mouth 2 (two) times daily.  Dispense: 10 tablet; Refill: 0   Return  if symptoms worsen or fail to improve.      Reynolds Bowl, PA-C, have reviewed all documentation for this visit. The documentation on 01/13/20 for the exam, diagnosis, procedures, and orders are all accurate and complete.   Rubye Beach  [facility] [phone] (phone) [phone] (fax)  [place]

## 2020-01-17 LAB — URINE CULTURE

## 2020-01-29 ENCOUNTER — Other Ambulatory Visit: Payer: Self-pay

## 2020-01-29 ENCOUNTER — Ambulatory Visit: Payer: Managed Care, Other (non HMO) | Admitting: Dermatology

## 2020-01-29 ENCOUNTER — Telehealth: Payer: Self-pay

## 2020-01-29 ENCOUNTER — Encounter: Payer: Self-pay | Admitting: Dermatology

## 2020-01-29 DIAGNOSIS — Z85828 Personal history of other malignant neoplasm of skin: Secondary | ICD-10-CM | POA: Diagnosis not present

## 2020-01-29 DIAGNOSIS — L918 Other hypertrophic disorders of the skin: Secondary | ICD-10-CM | POA: Diagnosis not present

## 2020-01-29 DIAGNOSIS — I8312 Varicose veins of left lower extremity with inflammation: Secondary | ICD-10-CM

## 2020-01-29 DIAGNOSIS — L814 Other melanin hyperpigmentation: Secondary | ICD-10-CM

## 2020-01-29 DIAGNOSIS — L821 Other seborrheic keratosis: Secondary | ICD-10-CM

## 2020-01-29 DIAGNOSIS — L578 Other skin changes due to chronic exposure to nonionizing radiation: Secondary | ICD-10-CM

## 2020-01-29 DIAGNOSIS — D692 Other nonthrombocytopenic purpura: Secondary | ICD-10-CM

## 2020-01-29 DIAGNOSIS — L820 Inflamed seborrheic keratosis: Secondary | ICD-10-CM | POA: Diagnosis not present

## 2020-01-29 DIAGNOSIS — Z1283 Encounter for screening for malignant neoplasm of skin: Secondary | ICD-10-CM

## 2020-01-29 DIAGNOSIS — D1800 Hemangioma unspecified site: Secondary | ICD-10-CM

## 2020-01-29 DIAGNOSIS — Z86018 Personal history of other benign neoplasm: Secondary | ICD-10-CM | POA: Diagnosis not present

## 2020-01-29 DIAGNOSIS — D229 Melanocytic nevi, unspecified: Secondary | ICD-10-CM

## 2020-01-29 NOTE — Telephone Encounter (Signed)
Called Dr Nicole Kindred physical therapy office [phone] scheduled pt for Tuesday Dec 14 at 9 am for a consult.     Called LM on patients VM with the above information appt time and date and if she has any questions please contact me

## 2020-01-29 NOTE — Patient Instructions (Signed)
Cryotherapy Aftercare  . Wash gently with soap and water everyday.   . Apply Vaseline and Band-Aid daily until healed.  

## 2020-01-29 NOTE — Progress Notes (Signed)
Follow-Up Visit   Subjective  Hannah Gonzalez is a 59 y.o. female who presents for the following: Annual Exam (mole check ) and Follow-up (lipodermatosclerosis of the left lower leg). The patient presents for Total-Body Skin Exam (TBSE) for skin cancer screening and mole check.  The following portions of the chart were reviewed this encounter and updated as appropriate:   Tobacco  Allergies  Meds  Problems  Med Hx  Surg Hx  Fam Hx     Review of Systems:  No other skin or systemic complaints except as noted in HPI or Assessment and Plan.  Objective  Well appearing patient in no apparent distress; mood and affect are within normal limits.  A full examination was performed including scalp, head, eyes, ears, nose, lips, neck, chest, axillae, abdomen, back, buttocks, bilateral upper extremities, bilateral lower extremities, hands, feet, fingers, toes, fingernails, and toenails. All findings within normal limits unless otherwise noted below.  Objective  Left Thigh: Well healed scar with no evidence of recurrence.   Objective  multiple see history: Scar with no evidence of recurrence.   Objective  back x 7, abdomen, inframammary x 11 (18): Erythematous keratotic or waxy stuck-on papule or plaque.   Objective  Left Axilla, Right axilla (10): Fleshy, skin-colored pedunculated papules.    Objective  Left Lower Leg: 4 spot   2 cm L medial lower leg superior  1 cm 1 cm lower leg  2 cm inferior lower leg  2 cm L post calf   Assessment & Plan  History of basal cell carcinoma (BCC) Left Thigh Clear. Observe for recurrence. Call clinic for new or changing lesions.  Recommend regular skin exams, daily broad-spectrum spf 30+ sunscreen use, and photoprotection.     History of dysplastic nevus multiple see history Clear. Observe for recurrence. Call clinic for new or changing lesions.  Recommend regular skin exams, daily broad-spectrum spf 30+ sunscreen use, and  photoprotection.     Inflamed seborrheic keratosis (18) back x 7, abdomen, inframammary x 11 Destruction of lesion - back x 7, abdomen, inframammary x 11 Complexity: simple   Destruction method: cryotherapy   Informed consent: discussed and consent obtained   Timeout:  patient name, date of birth, surgical site, and procedure verified Lesion destroyed using liquid nitrogen: Yes   Region frozen until ice ball extended beyond lesion: Yes   Outcome: patient tolerated procedure well with no complications   Post-procedure details: wound care instructions given    Skin tag (10) Left Axilla, Right axilla Acrochordons (Skin Tags) - Fleshy, skin-colored pedunculated papules - Benign appearing.  - Observe. - If desired, they can be removed with an in office procedure that is not covered by insurance. - Please call the clinic if you notice any new or changing lesions.   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  Purpura, but improvement and resolution at left calf site.  Will send order in to start therapeutic  Ultrasound treatments to aa. Continue Knee high graduated compression stockings.  We will refer to physical therapy Dr Nicole Kindred per patients request   Other Related Procedures Ambulatory referral to Physical Therapy  Pt scheduled with Dr Frederica Kuster Tuesday Dec 14 at [place] - Scattered tan macules - Discussed due to sun exposure -  Benign, observe - Call for any changes  Seborrheic Keratoses - Stuck-on, waxy, tan-brown papules and plaques  - Discussed benign etiology and prognosis. - Observe - Call for any changes  Melanocytic Nevi - Tan-brown and/or pink-flesh-colored symmetric macules and papules - Benign appearing on exam today -  Observation - Call clinic for new or changing moles - Recommend daily use of broad spectrum spf 30+ sunscreen to sun-exposed areas.   Hemangiomas - Red papules - Discussed benign nature - Observe - Call for any changes  Actinic Damage - Chronic, secondary to cumulative UV/sun exposure - diffuse scaly erythematous macules with underlying dyspigmentation - Recommend daily broad spectrum sunscreen SPF 30+ to sun-exposed areas, reapply every 2 hours as needed.  - Call for new or changing lesions.  Skin cancer screening performed today.  Return in about 6 months (around 07/29/2020).  IMarye Round, CMA, am acting as scribe for Sarina Ser, MD .  Documentation: I have reviewed the above documentation for accuracy and completeness, and I agree with the above.  Sarina Ser, MD

## 2020-02-04 ENCOUNTER — Encounter: Payer: Self-pay | Admitting: Dermatology

## 2020-04-01 ENCOUNTER — Encounter: Payer: Self-pay | Admitting: Family Medicine

## 2020-04-01 ENCOUNTER — Other Ambulatory Visit: Payer: Self-pay

## 2020-04-01 ENCOUNTER — Ambulatory Visit (INDEPENDENT_AMBULATORY_CARE_PROVIDER_SITE_OTHER): Payer: Managed Care, Other (non HMO) | Admitting: Family Medicine

## 2020-04-01 VITALS — BP 124/81 | HR 92 | Temp 98.5°F | Resp 16 | Ht 68.0 in | Wt 177.0 lb

## 2020-04-01 DIAGNOSIS — Z1211 Encounter for screening for malignant neoplasm of colon: Secondary | ICD-10-CM

## 2020-04-01 DIAGNOSIS — I10 Essential (primary) hypertension: Secondary | ICD-10-CM | POA: Diagnosis not present

## 2020-04-01 DIAGNOSIS — Z0000 Encounter for general adult medical examination without abnormal findings: Secondary | ICD-10-CM | POA: Diagnosis not present

## 2020-04-01 DIAGNOSIS — Z1389 Encounter for screening for other disorder: Secondary | ICD-10-CM

## 2020-04-01 DIAGNOSIS — E7800 Pure hypercholesterolemia, unspecified: Secondary | ICD-10-CM | POA: Diagnosis not present

## 2020-04-01 NOTE — Progress Notes (Signed)
I,[name],acting as a scribe for [name], MD.,have documented all relevant documentation on the behalf of [name], MD,as directed by  [name], MD while in the presence of [name], MD.  Complete physical exam   Patient: Hannah Gonzalez   DOB: [birth year]   60 y.o. Female  MRN: [id number] Visit Date: 04/01/2020  Today's healthcare provider: [name], MD   Chief Complaint  Patient presents with  . Annual Exam   Subjective    Hannah Gonzalez is a 60 y.o. female who presents today for a complete physical exam.  She reports consuming a general diet. The patient does not participate in regular exercise at present. She generally feels well. She reports sleeping fairly well. She does not have additional problems to discuss today.  She has now retired from [employer] and enjoying retirement.  She is taking care of her 71 year old mother. HPI    Past Medical History:  Diagnosis Date  . Arthritis   . Basal cell carcinoma 05/04/2001   Basal cell left thigh  . Hx of basal cell carcinoma 05/04/2001   L sup. ant. thigh  . Hx of dysplastic nevus 02/23/2009   Left buttocks, moderate  . Hx of dysplastic nevus 11/23/2009   Right posterior superior thigh  . Palpitations   . Vitamin D deficiency    Past Surgical History:  Procedure Laterality Date  . ANTERIOR CRUCIATE LIGAMENT REPAIR    . ARTHROSCOPIC REPAIR ACL     left  . BACK SURGERY    . BIOPSY BREAST     left   . BREAST BIOPSY Left 2010   benign-core  . LAPAROSCOPY    . MYOMECTOMY    . PAROTIDECTOMY    . THYMECTOMY    . TONSILLECTOMY    . TOTAL HIP ARTHROPLASTY Right 01/10/2018   Procedure: TOTAL HIP ARTHROPLASTY ANTERIOR APPROACH;  Surgeon: Lovell Sheehan, MD;  Location: [facility] ORS;  Service: Orthopedics;  Laterality: Right;   Social History   Socioeconomic History  . Marital status: Married    Spouse name: Shanon Brow  . Number of children: 0  . Years of  education: associates  . Highest education level: Not on file  Occupational History    Employer: [employer]  Tobacco Use  . Smoking status: Never Smoker  . Smokeless tobacco: Never Used  Vaping Use  . Vaping Use: Never used  Substance and Sexual Activity  . Alcohol use: No  . Drug use: No  . Sexual activity: Not Currently    Birth control/protection: Post-menopausal  Other Topics Concern  . Not on file  Social History Narrative  . Not on file   Social Determinants of Health   Financial Resource Strain: Not on file  Food Insecurity: Not on file  Transportation Needs: Not on file  Physical Activity: Not on file  Stress: Not on file  Social Connections: Not on file  Intimate Partner Violence: Not on file   Family Status  Relation Name Status  . Mother  Alive  . Brother  Alive  . Father  Deceased       pacemaker implant  . Cousin maternal Alive  . Mat Aunt  (Not Specified)   Family History  Problem Relation Age of Onset  . Hypertension Mother   . Hyperlipidemia Mother   . Heart disease Mother   . Heart failure Mother   . Arrhythmia Mother        A-Fib  .  Arthritis Mother   . Cancer Mother   . Hypertension Brother   . Hyperlipidemia Brother   . Arthritis Father   . Hypertension Father   . Prostate cancer Father   . Breast cancer Cousin   . Cancer Maternal Aunt   . Breast cancer Maternal Aunt    Allergies  Allergen Reactions  . Aspirin Hives, Rash and Anaphylaxis    Patient Care Team: [name]., MD as PCP - General (Family Medicine) Rockey Situ Kathlene November, MD as Consulting Physician (Cardiology)   Medications: Outpatient Medications Prior to Visit  Medication Sig  . Cetirizine HCl 10 MG CAPS Take 10 mg by mouth daily.   . Cholecalciferol (VITAMIN D3) 1000 units CAPS Take 1,000 Units by mouth daily.   Marland Kitchen CRANBERRY PO Take 1 capsule by mouth daily.  . Menthol, Topical Analgesic, (BLUE-EMU MAXIMUM STRENGTH EX) Apply 1 application topically 2 (two)  times daily.  . metoprolol succinate (TOPROL-XL) 50 MG 24 hr tablet TAKE 1 TABLET BY MOUTH  DAILY WITH OR IMMEDIATLEY  FOLLOWING A MEAL  . Multiple Vitamins-Minerals (ONE-A-DAY WOMENS 50 PLUS PO) Take 1 tablet by mouth daily.  . Omega-3 Fatty Acids (FISH OIL) 1200 MG CAPS Take 1,200 mg by mouth every other day. Alternating days with flaxseed oil  . OSPHENA 60 MG TABS TAKE 1 TABLET BY MOUTH DAILY  . tolterodine (DETROL LA) 4 MG 24 hr capsule TAKE 1 CAPSULE BY MOUTH  DAILY  . sulfamethoxazole-trimethoprim (BACTRIM DS) 800-160 MG tablet Take 1 tablet by mouth 2 (two) times daily. (Patient not taking: Reported on 04/01/2020)   No facility-administered medications prior to visit.    Review of Systems  Genitourinary: Positive for urgency.  Allergic/Immunologic: Positive for environmental allergies.  All other systems reviewed and are negative.      Objective    BP 124/81 (BP Location: Right Arm, Patient Position: Sitting, Cuff Size: Large)   Pulse 92   Temp 98.5 F (36.9 C) (Oral)   Resp 16   Ht 5\' 8"  (1.727 m)   Wt 177 lb (80.3 kg)   SpO2 98%   BMI 26.91 kg/m  Wt Readings from Last 3 Encounters:  04/01/20 177 lb (80.3 kg)  01/13/20 176 lb 4.8 oz (80 kg)  10/08/19 175 lb (79.4 kg)      Physical Exam Vitals reviewed.  Constitutional:      Appearance: Normal appearance. She is well-developed.  HENT:     Head: Normocephalic and atraumatic.     Right Ear: External ear normal.     Left Ear: External ear normal.     Nose: Nose normal.  Eyes:     General: No scleral icterus.    Conjunctiva/sclera: Conjunctivae normal.  Neck:     Thyroid: No thyromegaly.  Cardiovascular:     Rate and Rhythm: Normal rate and regular rhythm.     Heart sounds: Normal heart sounds.  Pulmonary:     Effort: Pulmonary effort is normal.     Breath sounds: Normal breath sounds.  Abdominal:     Palpations: Abdomen is soft.  Musculoskeletal:     Right lower leg: No edema.     Left lower leg: No  edema.  Lymphadenopathy:     Cervical: No cervical adenopathy.  Skin:    General: Skin is warm and dry.     Comments: Fair skin.  Neurological:     General: No focal deficit present.     Mental Status: She is alert and oriented to  person, place, and time.  Psychiatric:        Mood and Affect: Mood normal.        Behavior: Behavior normal.        Thought Content: Thought content normal.        Judgment: Judgment normal.       Last depression screening scores PHQ 2/9 Scores 04/01/2020 04/01/2020 04/01/2019  PHQ - 2 Score 0 0 0  PHQ- 9 Score 0 0 0   Last fall risk screening Fall Risk  04/01/2020  Falls in the past year? 1  Number falls in past yr: 1  Injury with Fall? 1  Follow up Falls evaluation completed   Last Audit-C alcohol use screening Alcohol Use Disorder Test (AUDIT) 04/01/2020  1. How often do you have a drink containing alcohol? 0  2. How many drinks containing alcohol do you have on a typical day when you are drinking? 0  3. How often do you have six or more drinks on one occasion? 0  AUDIT-C Score 0  Alcohol Brief Interventions/Follow-up AUDIT Score <7 follow-up not indicated   A score of 3 or more in women, and 4 or more in men indicates increased risk for alcohol abuse, EXCEPT if all of the points are from question 1   No results found for any visits on 04/01/20.  Assessment & Plan    Routine Health Maintenance and Physical Exam  Exercise Activities and Dietary recommendations Goals   None     Immunization History  Administered Date(s) Administered  . Influenza Inj Mdck Quad Pf 11/17/2018  . Influenza Split 11/22/2015  . Influenza Whole 11/21/2016  . Influenza-Unspecified 12/03/2014  . Td 09/21/2015  . Tdap 08/30/2005    Health Maintenance  Topic Date Due  . COVID-19 Vaccine (1) Never done  . INFLUENZA VACCINE  09/22/2019  . MAMMOGRAM  04/23/2021  . PAP SMEAR-Modifier  08/09/2021  . COLONOSCOPY (Pts 45-67yrs Insurance coverage will need to be  confirmed)  05/18/2025  . TETANUS/TDAP  09/20/2025  . Hepatitis C Screening  Completed  . HIV Screening  Completed    Discussed health benefits of physical activity, and encouraged her to engage in regular exercise appropriate for her age and condition.  1. Annual physical exam Return to clinic 1 year.  Repeat Pap smear in 2025, repeat colonoscopy 2022 vs 2027. Note from [facility] says 2022 - Lipid panel - TSH - CBC w/Diff/Platelet - Comprehensive Metabolic Panel (CMET)  2. Essential hypertension   3. Hypercholesteremia   4. Screening for blood or protein in urine  - POCT urinalysis dipstick  5. Screening for colon cancer  - Ambulatory referral to Gastroenterology   No follow-ups on file.     I, [name], MD, have reviewed all documentation for this visit. The documentation on 04/06/20 for the exam, diagnosis, procedures, and orders are all accurate and complete.    Meena Barrantes Cranford Mon, MD  [facility] [phone] (phone) [phone] (fax)  [place]

## 2020-04-03 ENCOUNTER — Other Ambulatory Visit: Payer: Self-pay | Admitting: Family Medicine

## 2020-04-03 DIAGNOSIS — Z1231 Encounter for screening mammogram for malignant neoplasm of breast: Secondary | ICD-10-CM

## 2020-04-30 ENCOUNTER — Other Ambulatory Visit: Payer: Self-pay | Admitting: Obstetrics and Gynecology

## 2020-04-30 DIAGNOSIS — N941 Unspecified dyspareunia: Secondary | ICD-10-CM

## 2020-05-05 ENCOUNTER — Ambulatory Visit
Admission: RE | Admit: 2020-05-05 | Discharge: 2020-05-05 | Disposition: A | Discharge status: Home / Self Care | Payer: Managed Care, Other (non HMO) | Source: Ambulatory Visit | Attending: Family Medicine | Admitting: Family Medicine

## 2020-05-05 ENCOUNTER — Other Ambulatory Visit: Payer: Self-pay

## 2020-05-05 DIAGNOSIS — Z1231 Encounter for screening mammogram for malignant neoplasm of breast: Secondary | ICD-10-CM | POA: Insufficient documentation

## 2020-06-04 ENCOUNTER — Other Ambulatory Visit: Payer: Self-pay

## 2020-06-04 ENCOUNTER — Ambulatory Visit: Payer: Managed Care, Other (non HMO) | Admitting: Family Medicine

## 2020-06-04 ENCOUNTER — Encounter: Payer: Self-pay | Admitting: Family Medicine

## 2020-06-04 VITALS — BP 122/69 | HR 93 | Temp 97.9°F | Resp 16 | Wt 185.0 lb

## 2020-06-04 DIAGNOSIS — I8312 Varicose veins of left lower extremity with inflammation: Secondary | ICD-10-CM | POA: Diagnosis not present

## 2020-06-04 DIAGNOSIS — M79605 Pain in left leg: Secondary | ICD-10-CM | POA: Diagnosis not present

## 2020-06-04 MED ORDER — LIDOCAINE 5 % EX PTCH: 1.0000 | MEDICATED_PATCH | CUTANEOUS | 5 refills | Status: DC

## 2020-06-04 NOTE — Progress Notes (Signed)
I,[name],acting as a scribe for [name], MD.,have documented all relevant documentation on the behalf of [name], MD,as directed by  [name], MD while in the presence of [name], MD.   Established patient visit   Patient: Hannah Gonzalez   DOB: [birth year]   60 y.o. Female  MRN: [id number] Visit Date: 06/04/2020  Today's healthcare provider: [name], MD   No chief complaint on file.  Subjective    HPI  Patient saw dermatology for this and got some relief with physical therapy.  She is unable to get appointment back with him for several months. Lipodermatosclerosis of left lower extremity Patient has had a change in her insurance since being referred to Dr. Nehemiah Massed. Patient wants to discuss symptoms with Dr. Rosanna Randy.       Medications: Outpatient Medications Prior to Visit  Medication Sig  . Cetirizine HCl 10 MG CAPS Take 10 mg by mouth daily.   . Cholecalciferol (VITAMIN D3) 1000 units CAPS Take 1,000 Units by mouth daily.   Marland Kitchen CRANBERRY PO Take 1 capsule by mouth daily.  . Menthol, Topical Analgesic, (BLUE-EMU MAXIMUM STRENGTH EX) Apply 1 application topically 2 (two) times daily.  . metoprolol succinate (TOPROL-XL) 50 MG 24 hr tablet TAKE 1 TABLET BY MOUTH  DAILY WITH OR IMMEDIATLEY  FOLLOWING A MEAL  . Multiple Vitamins-Minerals (ONE-A-DAY WOMENS 50 PLUS PO) Take 1 tablet by mouth daily.  . Omega-3 Fatty Acids (FISH OIL) 1200 MG CAPS Take 1,200 mg by mouth every other day. Alternating days with flaxseed oil  . OSPHENA 60 MG TABS TAKE 1 TABLET BY MOUTH DAILY  . tolterodine (DETROL LA) 4 MG 24 hr capsule TAKE 1 CAPSULE BY MOUTH  DAILY  . sulfamethoxazole-trimethoprim (BACTRIM DS) 800-160 MG tablet Take 1 tablet by mouth 2 (two) times daily. (Patient not taking: No sig reported)   No facility-administered medications prior to visit.    Review of Systems      Objective    BP 122/69 (BP Location: Left Arm,  Patient Position: Sitting, Cuff Size: Large)   Pulse 93   Temp 97.9 F (36.6 C) (Oral)   Resp 16   Wt 185 lb (83.9 kg)   SpO2 96%   BMI 28.13 kg/m      Physical Exam Vitals reviewed.  Constitutional:      Appearance: Normal appearance.  Cardiovascular:     Heart sounds: Normal heart sounds.  Pulmonary:     Breath sounds: Normal breath sounds.  Abdominal:     Palpations: Abdomen is soft.  Skin:    General: Skin is warm and dry.     Comments: Tender slight rash in the lower leg that has not expanded but it still bothers the patient good bit with the discomfort.  Neurological:     General: No focal deficit present.     Mental Status: She is alert and oriented to person, place, and time.  Psychiatric:        Mood and Affect: Mood normal.        Behavior: Behavior normal.        Thought Content: Thought content normal.        Judgment: Judgment normal.       No results found for any visits on 06/04/20.  Assessment & Plan     1. Lipodermatosclerosis of left lower extremity Back to physical therapy and try Lidoderm patch.  She has appointment with dermatology pending. -  lidocaine (LIDODERM) 5 %; Place 1 patch onto the skin daily.  Dispense: 30 patch; Refill: 5 - Ambulatory referral to Physical Therapy  2. Left leg pain Hopefully the Lidoderm patch will help. - lidocaine (LIDODERM) 5 %; Place 1 patch onto the skin daily.  Dispense: 30 patch; Refill: 5 - Ambulatory referral to Physical Therapy  No follow-ups on file.      I, [name], MD, have reviewed all documentation for this visit. The documentation on 06/13/20 for the exam, diagnosis, procedures, and orders are all accurate and complete.    Damontay Alred Cranford Mon, MD  [facility] [phone] (phone) [phone] (fax)  [place]

## 2020-06-09 ENCOUNTER — Telehealth: Payer: Self-pay

## 2020-06-09 DIAGNOSIS — M25473 Effusion, unspecified ankle: Secondary | ICD-10-CM

## 2020-06-09 NOTE — Telephone Encounter (Signed)
Copied from [place] [id number]. Topic: General - Inquiry >> Jun 09, 2020  2:10 PM Greggory Keen D wrote: Reason for CRM: Pt called saying she would like an appt to [facility].  She said she talked to Dr. Rosanna Randy last week and he told her to call back if she decided to go.  She said today her ankle and foot are very swollen today.   CB#  [phone]

## 2020-06-09 NOTE — Telephone Encounter (Signed)
Ok to refer, office visit if patient has concerns since seeing Dr. Rosanna Randy or if this is new.

## 2020-06-09 NOTE — Telephone Encounter (Signed)
Order for referral has been placed since patient has addressed with PCP at last visit. KW

## 2020-06-09 NOTE — Telephone Encounter (Signed)
Please review, it is okay to refer?  Thanks,   -Mickel Baas

## 2020-06-17 LAB — CBC WITH DIFFERENTIAL/PLATELET
Basophils Absolute: 0 10*3/uL (ref 0.0–0.2)
Basos: 1 %
EOS (ABSOLUTE): 0.1 10*3/uL (ref 0.0–0.4)
Eos: 2 %
Hematocrit: 38.2 % (ref 34.0–46.6)
Hemoglobin: 12.8 g/dL (ref 11.1–15.9)
Immature Grans (Abs): 0 10*3/uL (ref 0.0–0.1)
Immature Granulocytes: 0 %
Lymphocytes Absolute: 2.6 10*3/uL (ref 0.7–3.1)
Lymphs: 47 %
MCH: 29.6 pg (ref 26.6–33.0)
MCHC: 33.5 g/dL (ref 31.5–35.7)
MCV: 88 fL (ref 79–97)
Monocytes Absolute: 0.5 10*3/uL (ref 0.1–0.9)
Monocytes: 8 %
Neutrophils Absolute: 2.3 10*3/uL (ref 1.4–7.0)
Neutrophils: 42 %
Platelets: 251 10*3/uL (ref 150–450)
RBC: 4.32 x10E6/uL (ref 3.77–5.28)
RDW: 12.4 % (ref 11.7–15.4)
WBC: 5.5 10*3/uL (ref 3.4–10.8)

## 2020-06-17 LAB — COMPREHENSIVE METABOLIC PANEL
ALT: 19 IU/L (ref 0–32)
AST: 19 IU/L (ref 0–40)
Albumin/Globulin Ratio: 1.9 (ref 1.2–2.2)
Albumin: 4.2 g/dL (ref 3.8–4.8)
Alkaline Phosphatase: 58 IU/L (ref 44–121)
BUN/Creatinine Ratio: 15 (ref 12–28)
BUN: 14 mg/dL (ref 8–27)
Bilirubin Total: 0.4 mg/dL (ref 0.0–1.2)
CO2: 22 mmol/L (ref 20–29)
Calcium: 9 mg/dL (ref 8.7–10.3)
Chloride: 102 mmol/L (ref 96–106)
Creatinine, Ser: 0.95 mg/dL (ref 0.57–1.00)
Globulin, Total: 2.2 g/dL (ref 1.5–4.5)
Glucose: 98 mg/dL (ref 65–99)
Potassium: 4.2 mmol/L (ref 3.5–5.2)
Sodium: 141 mmol/L (ref 134–144)
Total Protein: 6.4 g/dL (ref 6.0–8.5)
eGFR: 68 mL/min/{1.73_m2} (ref >59)

## 2020-06-17 LAB — TSH: TSH: 3.48 u[IU]/mL (ref 0.450–4.500)

## 2020-06-17 LAB — LIPID PANEL
Chol/HDL Ratio: 5 ratio — ABNORMAL HIGH (ref 0.0–4.4)
Cholesterol, Total: 238 mg/dL — ABNORMAL HIGH (ref 100–199)
HDL: 48 mg/dL (ref >39)
LDL Chol Calc (NIH): 161 mg/dL — ABNORMAL HIGH (ref 0–99)
Triglycerides: 162 mg/dL — ABNORMAL HIGH (ref 0–149)
VLDL Cholesterol Cal: 29 mg/dL (ref 5–40)

## 2020-06-30 ENCOUNTER — Other Ambulatory Visit: Payer: Self-pay | Admitting: Family Medicine

## 2020-06-30 ENCOUNTER — Ambulatory Visit (INDEPENDENT_AMBULATORY_CARE_PROVIDER_SITE_OTHER): Payer: Managed Care, Other (non HMO) | Admitting: Vascular Surgery

## 2020-06-30 ENCOUNTER — Encounter (INDEPENDENT_AMBULATORY_CARE_PROVIDER_SITE_OTHER): Payer: Self-pay | Admitting: Vascular Surgery

## 2020-06-30 ENCOUNTER — Other Ambulatory Visit: Payer: Self-pay

## 2020-06-30 VITALS — BP 122/83 | HR 96 | Resp 16 | Ht 68.0 in | Wt 181.4 lb

## 2020-06-30 DIAGNOSIS — M7989 Other specified soft tissue disorders: Secondary | ICD-10-CM | POA: Diagnosis not present

## 2020-06-30 DIAGNOSIS — I8312 Varicose veins of left lower extremity with inflammation: Secondary | ICD-10-CM

## 2020-06-30 DIAGNOSIS — I10 Essential (primary) hypertension: Secondary | ICD-10-CM

## 2020-06-30 NOTE — Progress Notes (Signed)
Patient ID: Hannah Gonzalez, female   DOB: 01/13/1961, 60 y.o.   MRN: [id number]  Chief Complaint  Patient presents with  . New Patient (Initial Visit)    Ref Flinchum ankle swelling    HPI Hannah Gonzalez is a 60 y.o. female.  I am asked to see the patient by M. Flinchem for evaluation of left leg swelling.  She has been diagnosed with lipodermatosclerosis of the left lower extremity and has been getting treatment for that.  This is very painful.  This was diagnosed about 8 months ago with biopsy.  Over the past couple of months, she has been having increased swelling in the left lower leg and ankle area.  This is particularly prominent in the areas below her skin lesions.  She has 3 separate skin lesions on that left leg.  This creates a burning and stinging pain in the leg.  The varicosities become more prominent.  The swelling has been worsening despite having worn compression stockings even before she was noticing swelling.  She has been doing that for many months now.  No previous history of DVT or superficial thrombophlebitis to her knowledge.  No right leg symptoms.   Past Medical History:  Diagnosis Date  . Arthritis   . Basal cell carcinoma 05/04/2001   Basal cell left thigh  . Hx of basal cell carcinoma 05/04/2001   L sup. ant. thigh  . Hx of dysplastic nevus 02/23/2009   Left buttocks, moderate  . Hx of dysplastic nevus 11/23/2009   Right posterior superior thigh  . Palpitations   . Vitamin D deficiency     Past Surgical History:  Procedure Laterality Date  . ANTERIOR CRUCIATE LIGAMENT REPAIR    . ARTHROSCOPIC REPAIR ACL     left  . BACK SURGERY    . BIOPSY BREAST     left   . BREAST BIOPSY Left 2010   benign-core  . LAPAROSCOPY    . MYOMECTOMY    . PAROTIDECTOMY    . THYMECTOMY    . TONSILLECTOMY    . TOTAL HIP ARTHROPLASTY Right 01/10/2018   Procedure: TOTAL HIP ARTHROPLASTY ANTERIOR APPROACH;  Surgeon: Lyndle Herrlich, MD;  Location: [facility] ORS;   Service: Orthopedics;  Laterality: Right;     Family History  Problem Relation Age of Onset  . Hypertension Mother   . Hyperlipidemia Mother   . Heart disease Mother   . Heart failure Mother   . Arrhythmia Mother        A-Fib  . Arthritis Mother   . Cancer Mother   . Hypertension Brother   . Hyperlipidemia Brother   . Arthritis Father   . Hypertension Father   . Prostate cancer Father   . Breast cancer Cousin   . Cancer Maternal Aunt   . Breast cancer Maternal Aunt       Social History   Tobacco Use  . Smoking status: Never Smoker  . Smokeless tobacco: Never Used  Vaping Use  . Vaping Use: Never used  Substance Use Topics  . Alcohol use: No  . Drug use: No     Allergies  Allergen Reactions  . Aspirin Hives, Rash and Anaphylaxis    Current Outpatient Medications  Medication Sig Dispense Refill  . Cetirizine HCl 10 MG CAPS Take 10 mg by mouth daily.     . Cholecalciferol (VITAMIN D3) 1000 units CAPS Take 1,000 Units by mouth daily.     Marland Kitchen CRANBERRY PO Take  1 capsule by mouth daily.    Marland Kitchen lidocaine (LIDODERM) 5 % Place 1 patch onto the skin daily. 30 patch 5  . Menthol, Topical Analgesic, (BLUE-EMU MAXIMUM STRENGTH EX) Apply 1 application topically 2 (two) times daily.    . metoprolol succinate (TOPROL-XL) 50 MG 24 hr tablet TAKE 1 TABLET BY MOUTH  DAILY WITH OR IMMEDIATLEY  FOLLOWING A MEAL 90 tablet 1  . Multiple Vitamins-Minerals (ONE-A-DAY WOMENS 50 PLUS PO) Take 1 tablet by mouth daily.    . OSPHENA 60 MG TABS TAKE 1 TABLET BY MOUTH DAILY 30 tablet 11  . tolterodine (DETROL LA) 4 MG 24 hr capsule TAKE 1 CAPSULE BY MOUTH  DAILY 90 capsule 3  . TURMERIC PO Take 1,000 mg by mouth.    . Omega-3 Fatty Acids (FISH OIL) 1200 MG CAPS Take 1,200 mg by mouth every other day. Alternating days with flaxseed oil (Patient not taking: Reported on 06/30/2020)    . sulfamethoxazole-trimethoprim (BACTRIM DS) 800-160 MG tablet Take 1 tablet by mouth 2 (two) times daily. (Patient  not taking: No sig reported) 10 tablet 0   No current facility-administered medications for this visit.      REVIEW OF SYSTEMS (Negative unless checked)  Constitutional: [] Weight loss  [] Fever  [] Chills Cardiac: [] Chest pain   [] Chest pressure   [] Palpitations   [] Shortness of breath when laying flat   [] Shortness of breath at rest   [] Shortness of breath with exertion. Vascular:  [x] Pain in legs with walking   [x] Pain in legs at rest   [] Pain in legs when laying flat   [] Claudication   [] Pain in feet when walking  [] Pain in feet at rest  [] Pain in feet when laying flat   [] History of DVT   [] Phlebitis   [x] Swelling in legs   [] Varicose veins   [] Non-healing ulcers Pulmonary:   [] Uses home oxygen   [] Productive cough   [] Hemoptysis   [] Wheeze  [] COPD   [] Asthma Neurologic:  [] Dizziness  [] Blackouts   [] Seizures   [] History of stroke   [] History of TIA  [] Aphasia   [] Temporary blindness   [] Dysphagia   [] Weakness or numbness in arms   [] Weakness or numbness in legs Musculoskeletal:  [x] Arthritis   [] Joint swelling   [] Joint pain   [] Low back pain Hematologic:  [] Easy bruising  [] Easy bleeding   [] Hypercoagulable state   [] Anemic  [] Hepatitis Gastrointestinal:  [] Blood in stool   [] Vomiting blood  [] Gastroesophageal reflux/heartburn   [] Abdominal pain Genitourinary:  [] Chronic kidney disease   [] Difficult urination  [] Frequent urination  [] Burning with urination   [] Hematuria Skin:  [] Rashes   [] Ulcers   [] Wounds Psychological:  [] History of anxiety   []  History of major depression.    Physical Exam BP 122/83 (BP Location: Left Arm)   Pulse 96   Resp 16   Ht 5\' 8"  (1.727 m)   Wt 181 lb 6.4 oz (82.3 kg)   BMI 27.58 kg/m  Gen:  WD/WN, NAD Head: [place]/AT, No temporalis wasting.  Ear/Nose/Throat: Hearing grossly intact, nares w/o erythema or drainage, oropharynx w/o Erythema/Exudate Eyes: Conjunctiva clear, sclera non-icteric  Neck: trachea midline.  No JVD.  Pulmonary:  Good air movement,  respirations not labored, no use of accessory muscles  Cardiac: RRR, no JVD Vascular:  Vessel Right Left  Radial Palpable Palpable  Gastrointestinal:. No masses, surgical incisions, or scars. Musculoskeletal: M/S 5/5 throughout.  Extremities without ischemic changes.  No deformity or atrophy.  3 circular red-purple areas in the medial and posterior calf.  These are bigger than a quarter.  These are tender to the touch.  1+ left lower extremity edema. Neurologic: Sensation grossly intact in extremities.  Symmetrical.  Speech is fluent. Motor exam as listed above. Psychiatric: Judgment intact, Mood & affect appropriate for pt's clinical situation. Dermatologic: No rashes or ulcers noted.  No cellulitis or open wounds.    Radiology No results found.  Labs Recent Results (from the past 2160 hour(s))  Lipid panel     Status: Abnormal   Collection Time: 06/16/20  8:35 AM  Result Value Ref Range   Cholesterol, Total 238 (H) 100 - 199 mg/dL   Triglycerides 125 (H) 0 - 149 mg/dL   HDL 48 >01 mg/dL   VLDL Cholesterol Cal 29 5 - 40 mg/dL   LDL Chol Calc (NIH) 222 (H) 0 - 99 mg/dL   Chol/HDL Ratio 5.0 (H) 0.0 - 4.4 ratio    Comment:                                   T. Chol/HDL Ratio                                             Men  Women                               1/2 Avg.Risk  3.4    3.3                                   Avg.Risk  5.0    4.4                                2X Avg.Risk  9.6    7.1                                3X Avg.Risk 23.4   11.0   TSH     Status: None   Collection Time: 06/16/20  8:35 AM  Result Value Ref Range   TSH 3.480 0.450 - 4.500 uIU/mL  CBC w/Diff/Platelet     Status: None   Collection Time: 06/16/20  8:35 AM  Result Value Ref Range   WBC 5.5 3.4 - 10.8 x10E3/uL   RBC 4.32 3.77 - 5.28 x10E6/uL   Hemoglobin 12.8 11.1 - 15.9 g/dL   Hematocrit 84.4 90.2 - 46.6 %   MCV 88 79 - 97 fL   MCH 29.6 26.6 - 33.0 pg    MCHC 33.5 31.5 - 35.7 g/dL   RDW 08.0 25.1 - 84.3 %   Platelets 251 150 - 450 x10E3/uL   Neutrophils 42 Not Estab. %   Lymphs 47 Not Estab. %   Monocytes 8 Not Estab. %   Eos 2 Not Estab. %   Basos 1 Not Estab. %   Neutrophils Absolute 2.3 1.4 - 7.0 x10E3/uL  Lymphocytes Absolute 2.6 0.7 - 3.1 x10E3/uL   Monocytes Absolute 0.5 0.1 - 0.9 x10E3/uL   EOS (ABSOLUTE) 0.1 0.0 - 0.4 x10E3/uL   Basophils Absolute 0.0 0.0 - 0.2 x10E3/uL   Immature Granulocytes 0 Not Estab. %   Immature Grans (Abs) 0.0 0.0 - 0.1 x10E3/uL  Comprehensive Metabolic Panel (CMET)     Status: None   Collection Time: 06/16/20  8:35 AM  Result Value Ref Range   Glucose 98 65 - 99 mg/dL   BUN 14 8 - 27 mg/dL   Creatinine, Ser 0.95 0.57 - 1.00 mg/dL   eGFR 68 >59 mL/min/1.73   BUN/Creatinine Ratio 15 12 - 28   Sodium 141 134 - 144 mmol/L   Potassium 4.2 3.5 - 5.2 mmol/L   Chloride 102 96 - 106 mmol/L   CO2 22 20 - 29 mmol/L   Calcium 9.0 8.7 - 10.3 mg/dL   Total Protein 6.4 6.0 - 8.5 g/dL   Albumin 4.2 3.8 - 4.8 g/dL   Globulin, Total 2.2 1.5 - 4.5 g/dL   Albumin/Globulin Ratio 1.9 1.2 - 2.2   Bilirubin Total 0.4 0.0 - 1.2 mg/dL   Alkaline Phosphatase 58 44 - 121 IU/L   AST 19 0 - 40 IU/L   ALT 19 0 - 32 IU/L    Assessment/Plan:  Swelling of limb I have had a long discussion with the patient regarding swelling and why it  causes symptoms.  Patient will continue wearing graduated compression stockings class 1 (20-30 mmHg) on a daily basis. The patient will  beginning wearing the stockings first thing in the morning and removing them in the evening. The patient is instructed specifically not to sleep in the stockings.   In addition, behavioral modification will be initiated.  This will include frequent elevation, use of over the counter pain medications and exercise such as walking.  I have reviewed systemic causes for chronic edema such as liver, kidney and cardiac etiologies.  The patient denies problems  with these organ systems.    Consideration for a lymph pump will also be made based upon the effectiveness of conservative therapy.  This would help to improve the edema control and prevent sequela such as ulcers and infections   Patient should undergo duplex ultrasound of the venous system to ensure that DVT or reflux is not present.  The patient will follow-up with me after the ultrasound.    Essential hypertension blood pressure control important in reducing the progression of atherosclerotic disease. On appropriate oral medications.   Lipodermatosclerosis of left lower extremity This is biopsy-proven.  A venous work-up is planned to help evaluate this and this is certainly in the distribution of the saphenous vein and branches.      Leotis Pain 07/01/2020, 8:34 AM   This note was created with Dragon medical transcription system.  Any errors from dictation are unintentional.

## 2020-07-01 DIAGNOSIS — M7989 Other specified soft tissue disorders: Secondary | ICD-10-CM | POA: Insufficient documentation

## 2020-07-01 DIAGNOSIS — M793 Panniculitis, unspecified: Secondary | ICD-10-CM | POA: Insufficient documentation

## 2020-07-01 DIAGNOSIS — I8312 Varicose veins of left lower extremity with inflammation: Secondary | ICD-10-CM | POA: Insufficient documentation

## 2020-07-01 NOTE — Assessment & Plan Note (Signed)
blood pressure control important in reducing the progression of atherosclerotic disease. On appropriate oral medications.  

## 2020-07-01 NOTE — Assessment & Plan Note (Signed)
This is biopsy-proven.  A venous work-up is planned to help evaluate this and this is certainly in the distribution of the saphenous vein and branches.

## 2020-07-01 NOTE — Assessment & Plan Note (Signed)
I have had a long discussion with the patient regarding swelling and why it  causes symptoms.  Patient will continue wearing graduated compression stockings class 1 (20-30 mmHg) on a daily basis. The patient will  beginning wearing the stockings first thing in the morning and removing them in the evening. The patient is instructed specifically not to sleep in the stockings.   In addition, behavioral modification will be initiated.  This will include frequent elevation, use of over the counter pain medications and exercise such as walking.  I have reviewed systemic causes for chronic edema such as liver, kidney and cardiac etiologies.  The patient denies problems with these organ systems.    Consideration for a lymph pump will also be made based upon the effectiveness of conservative therapy.  This would help to improve the edema control and prevent sequela such as ulcers and infections   Patient should undergo duplex ultrasound of the venous system to ensure that DVT or reflux is not present.  The patient will follow-up with me after the ultrasound.   

## 2020-07-01 NOTE — Patient Instructions (Signed)

## 2020-07-21 ENCOUNTER — Ambulatory Visit (INDEPENDENT_AMBULATORY_CARE_PROVIDER_SITE_OTHER): Payer: Managed Care, Other (non HMO) | Admitting: Vascular Surgery

## 2020-07-21 ENCOUNTER — Other Ambulatory Visit: Payer: Self-pay

## 2020-07-21 ENCOUNTER — Encounter (INDEPENDENT_AMBULATORY_CARE_PROVIDER_SITE_OTHER): Payer: Self-pay | Admitting: Vascular Surgery

## 2020-07-21 ENCOUNTER — Ambulatory Visit (INDEPENDENT_AMBULATORY_CARE_PROVIDER_SITE_OTHER): Payer: Managed Care, Other (non HMO)

## 2020-07-21 VITALS — BP 141/86 | HR 97 | Ht 68.0 in | Wt 183.0 lb

## 2020-07-21 DIAGNOSIS — M7989 Other specified soft tissue disorders: Secondary | ICD-10-CM

## 2020-07-21 DIAGNOSIS — I8312 Varicose veins of left lower extremity with inflammation: Secondary | ICD-10-CM | POA: Diagnosis not present

## 2020-07-21 DIAGNOSIS — I10 Essential (primary) hypertension: Secondary | ICD-10-CM

## 2020-07-21 DIAGNOSIS — R600 Localized edema: Secondary | ICD-10-CM

## 2020-07-21 NOTE — Progress Notes (Signed)
MRN : [id number]  Hannah Gonzalez is a 60 y.o. (Jul 09, 1960) female who presents with chief complaint of  Chief Complaint  Patient presents with  . Follow-up    U/S  .  History of Present Illness: Patient returns today in follow up of her left leg pain and swelling.  Her symptoms are the same without significant change from previous study.  A venous reflux study was done today which showed no DVT or superficial thrombophlebitis.  There was not really any significant reflux in the left lower extremity with the only area of reflux being in the great saphenous vein in the proximal thigh that had minimal reflux just above the threshold for normal over a very short segment.  Current Outpatient Medications  Medication Sig Dispense Refill  . Cetirizine HCl 10 MG CAPS Take 10 mg by mouth daily.     . Cholecalciferol (VITAMIN D3) 1000 units CAPS Take 1,000 Units by mouth daily.     Marland Kitchen CRANBERRY PO Take 1 capsule by mouth daily.    Marland Kitchen lidocaine (LIDODERM) 5 % Place 1 patch onto the skin daily. 30 patch 5  . Menthol, Topical Analgesic, (BLUE-EMU MAXIMUM STRENGTH EX) Apply 1 application topically 2 (two) times daily.    . metoprolol succinate (TOPROL-XL) 50 MG 24 hr tablet TAKE 1 TABLET BY MOUTH  DAILY WITH OR IMMEDIATLEY  FOLLOWING A MEAL 90 tablet 1  . Multiple Vitamins-Minerals (ONE-A-DAY WOMENS 50 PLUS PO) Take 1 tablet by mouth daily.    . Omega-3 Fatty Acids (FISH OIL) 1200 MG CAPS Take 1,200 mg by mouth every other day. Alternating days with flaxseed oil    . OSPHENA 60 MG TABS TAKE 1 TABLET BY MOUTH DAILY 30 tablet 11  . sulfamethoxazole-trimethoprim (BACTRIM DS) 800-160 MG tablet Take 1 tablet by mouth 2 (two) times daily. 10 tablet 0  . tolterodine (DETROL LA) 4 MG 24 hr capsule TAKE 1 CAPSULE BY MOUTH  DAILY 90 capsule 3  . TURMERIC PO Take 1,000 mg by mouth.     No current facility-administered medications for this visit.    Past Medical History:  Diagnosis Date  . Arthritis   .  Basal cell carcinoma 05/04/2001   Basal cell left thigh  . Hx of basal cell carcinoma 05/04/2001   L sup. ant. thigh  . Hx of dysplastic nevus 02/23/2009   Left buttocks, moderate  . Hx of dysplastic nevus 11/23/2009   Right posterior superior thigh  . Palpitations   . Vitamin D deficiency     Past Surgical History:  Procedure Laterality Date  . ANTERIOR CRUCIATE LIGAMENT REPAIR    . ARTHROSCOPIC REPAIR ACL     left  . BACK SURGERY    . BIOPSY BREAST     left   . BREAST BIOPSY Left 2010   benign-core  . LAPAROSCOPY    . MYOMECTOMY    . PAROTIDECTOMY    . THYMECTOMY    . TONSILLECTOMY    . TOTAL HIP ARTHROPLASTY Right 01/10/2018   Procedure: TOTAL HIP ARTHROPLASTY ANTERIOR APPROACH;  Surgeon: Lovell Sheehan, MD;  Location: [facility] ORS;  Service: Orthopedics;  Laterality: Right;     Social History   Tobacco Use  . Smoking status: Never Smoker  . Smokeless tobacco: Never Used  Vaping Use  . Vaping Use: Never used  Substance Use Topics  . Alcohol use: No  . Drug use: No     Family History  Problem Relation Age of Onset  .  Hypertension Mother   . Hyperlipidemia Mother   . Heart disease Mother   . Heart failure Mother   . Arrhythmia Mother        A-Fib  . Arthritis Mother   . Cancer Mother   . Hypertension Brother   . Hyperlipidemia Brother   . Arthritis Father   . Hypertension Father   . Prostate cancer Father   . Breast cancer Cousin   . Cancer Maternal Aunt   . Breast cancer Maternal Aunt     Allergies  Allergen Reactions  . Aspirin Hives, Rash and Anaphylaxis    REVIEW OF SYSTEMS (Negative unless checked)  Constitutional: _0 ?Weight loss  _1 ?Fever  _2 ?Chills Cardiac: _3 ?Chest pain   _4 ?Chest pressure   _5 ?Palpitations   _6 ?Shortness of breath when laying flat   _7 ?Shortness of breath at rest   _8 ?Shortness of breath with exertion. Vascular:  _9 ?Pain in legs with walking   _10 ?Pain in legs at rest   _11 ?Pain in legs when laying flat    _12 ?Claudication   _13 ?Pain in feet when walking  _14 ?Pain in feet at rest  _15 ?Pain in feet when laying flat   _16 ?History of DVT   _17 ?Phlebitis   _18 ?Swelling in legs   _19 ?Varicose veins   _20 ?Non-healing ulcers Pulmonary:   _21 ?Uses home oxygen   _22 ?Productive cough   _23 ?Hemoptysis   _24 ?Wheeze  _25 ?COPD   _26 ?Asthma Neurologic:  _27 ?Dizziness  _28 ?Blackouts   _29 ?Seizures   _30 ?History of stroke   _31 ?History of TIA  _32 ?Aphasia   _33 ?Temporary blindness   _34 ?Dysphagia   _35 ?Weakness or numbness in arms   _36 ?Weakness or numbness in legs Musculoskeletal:  _37 ?Arthritis   _38 ?Joint swelling   _39 ?Joint pain   _40 ?Low back pain Hematologic:  _41 ?Easy bruising  _42 ?Easy bleeding   _43 ?Hypercoagulable state   _44 ?Anemic  _45 ?Hepatitis Gastrointestinal:  _46 ?Blood in stool   _47 ?Vomiting blood  _48 ?Gastroesophageal reflux/heartburn   _49 ?Abdominal pain Genitourinary:  _50 ?Chronic kidney disease   _51 ?Difficult urination  _52 ?Frequent urination  _53 ?Burning with urination   _54 ?Hematuria Skin:  _55 ?Rashes   _56 ?Ulcers   _57 ?Wounds Psychological:  _58 ?History of anxiety   _59 ? History of major depression.   Physical Examination  BP (!) 141/86   Pulse 97   Ht _60  (1.727 m)   Wt 183 lb (83 kg)   BMI 27.83 kg/m  Gen:  WD/WN, NAD Head: [place]/AT, No temporalis wasting. Ear/Nose/Throat: Hearing grossly intact, nares w/o erythema or drainage Eyes: Conjunctiva clear. Sclera non-icteric Neck: Supple.  Trachea midline Pulmonary:  Good air movement, no use of accessory muscles.  Cardiac: RRR, no JVD Vascular:  Vessel Right Left  Radial Palpable Palpable                          PT Palpable Palpable  DP Palpable Palpable   Gastrointestinal: soft, non-tender/non-distended. No guarding/reflex.  Musculoskeletal: M/S 5/5 throughout.  No deformity or atrophy.  3 circular areas of lipodermatosclerosis in the left lower extremity.  1+ left lower extremity edema. Neurologic: Sensation grossly intact in extremities.  Symmetrical.  Speech is  fluent.  Psychiatric: Judgment intact, Mood & affect appropriate for pt's clinical situation. Dermatologic: No rashes or ulcers noted.  No cellulitis or open wounds.       Labs Recent Results (from the past 2160 hour(s))  Lipid panel     Status: Abnormal   Collection Time: 06/16/20  8:35 AM  Result Value Ref Range   Cholesterol, Total 238 (H) 100 - 199 mg/dL  Triglycerides 162 (H) 0 - 149 mg/dL   HDL 48 >39 mg/dL   VLDL Cholesterol Cal 29 5 - 40 mg/dL   LDL Chol Calc (NIH) 161 (H) 0 - 99 mg/dL   Chol/HDL Ratio 5.0 (H) 0.0 - 4.4 ratio    Comment:                                   T. Chol/HDL Ratio                                             Men  Women                               1/2 Avg.Risk  3.4    3.3                                   Avg.Risk  5.0    4.4                                2X Avg.Risk  9.6    7.1                                3X Avg.Risk 23.4   11.0   TSH     Status: None   Collection Time: 06/16/20  8:35 AM  Result Value Ref Range   TSH 3.480 0.450 - 4.500 uIU/mL  CBC w/Diff/Platelet     Status: None   Collection Time: 06/16/20  8:35 AM  Result Value Ref Range   WBC 5.5 3.4 - 10.8 x10E3/uL   RBC 4.32 3.77 - 5.28 x10E6/uL   Hemoglobin 12.8 11.1 - 15.9 g/dL   Hematocrit 38.2 34.0 - 46.6 %   MCV 88 79 - 97 fL   MCH 29.6 26.6 - 33.0 pg   MCHC 33.5 31.5 - 35.7 g/dL   RDW 12.4 11.7 - 15.4 %   Platelets 251 150 - 450 x10E3/uL   Neutrophils 42 Not Estab. %   Lymphs 47 Not Estab. %   Monocytes 8 Not Estab. %   Eos 2 Not Estab. %   Basos 1 Not Estab. %   Neutrophils Absolute 2.3 1.4 - 7.0 x10E3/uL   Lymphocytes Absolute 2.6 0.7 - 3.1 x10E3/uL   Monocytes Absolute 0.5 0.1 - 0.9 x10E3/uL   EOS (ABSOLUTE) 0.1 0.0 - 0.4 x10E3/uL   Basophils Absolute 0.0 0.0 - 0.2 x10E3/uL   Immature Granulocytes 0 Not Estab. %   Immature Grans (Abs) 0.0 0.0 - 0.1 x10E3/uL  Comprehensive Metabolic Panel (CMET)     Status: None   Collection Time: 06/16/20  8:35 AM  Result  Value Ref Range   Glucose 98 65 - 99 mg/dL   BUN 14 8 - 27 mg/dL   Creatinine, Ser 0.95 0.57 - 1.00 mg/dL   eGFR 68 >59 mL/min/1.73   BUN/Creatinine Ratio 15 12 - 28   Sodium 141 134 - 144 mmol/L   Potassium 4.2 3.5 - 5.2 mmol/L   Chloride 102 96 - 106 mmol/L  CO2 22 20 - 29 mmol/L   Calcium 9.0 8.7 - 10.3 mg/dL   Total Protein 6.4 6.0 - 8.5 g/dL   Albumin 4.2 3.8 - 4.8 g/dL   Globulin, Total 2.2 1.5 - 4.5 g/dL   Albumin/Globulin Ratio 1.9 1.2 - 2.2   Bilirubin Total 0.4 0.0 - 1.2 mg/dL   Alkaline Phosphatase 58 44 - 121 IU/L   AST 19 0 - 40 IU/L   ALT 19 0 - 32 IU/L    Radiology No results found.  Assessment/Plan  Essential hypertension blood pressure control important in reducing the progression of atherosclerotic disease. On appropriate oral medications.   Lipodermatosclerosis of left lower extremity This is biopsy-proven.  A venous work-up is planned to help evaluate this and this is certainly in the distribution of the saphenous vein and branches.  Swelling of limb A venous reflux study was done today which showed no DVT or superficial thrombophlebitis.  There was not really any significant reflux in the left lower extremity with the only area of reflux being in the great saphenous vein in the proximal thigh that had minimal reflux just above the threshold for normal over a very short segment.  Given these findings, no intervention is going to be of any benefit and this is not clearly the cause of her lipodermatosclerosis.    Leotis Pain, MD  07/21/2020 5:23 PM    This note was created with Dragon medical transcription system.  Any errors from dictation are purely unintentional

## 2020-07-21 NOTE — Assessment & Plan Note (Signed)
A venous reflux study was done today which showed no DVT or superficial thrombophlebitis.  There was not really any significant reflux in the left lower extremity with the only area of reflux being in the great saphenous vein in the proximal thigh that had minimal reflux just above the threshold for normal over a very short segment.  Given these findings, no intervention is going to be of any benefit and this is not clearly the cause of her lipodermatosclerosis.

## 2020-08-12 ENCOUNTER — Other Ambulatory Visit: Payer: Self-pay

## 2020-08-12 ENCOUNTER — Ambulatory Visit: Payer: Managed Care, Other (non HMO) | Admitting: Dermatology

## 2020-08-12 ENCOUNTER — Encounter: Payer: Self-pay | Admitting: Dermatology

## 2020-08-12 DIAGNOSIS — I8312 Varicose veins of left lower extremity with inflammation: Secondary | ICD-10-CM

## 2020-08-12 MED ORDER — PENTOXIFYLLINE ER 400 MG PO TBCR: 400.0000 mg | EXTENDED_RELEASE_TABLET | Freq: Three times a day (TID) | ORAL | 1 refills | Status: DC

## 2020-08-12 NOTE — Progress Notes (Signed)
   Follow-Up Visit   Subjective  Hannah Gonzalez is a 60 y.o. female who presents for the following: Skin Problem (6 months f/u Lipodermatosclerosis Left Lower Leg, pt went for 2 courses ~7 treatments each course of  therapeutic  Ultrasound treatments at [facility] therapy with a poor response, pt wear graduated compression stocking daily, past treatment included Kenalog injections). Pt went to the vascular surgery was told by Dr Lucky Cowboy no circulatory problems.   The following portions of the chart were reviewed this encounter and updated as appropriate:   Tobacco  Allergies  Meds  Problems  Med Hx  Surg Hx  Fam Hx      Review of Systems:  No other skin or systemic complaints except as noted in HPI or Assessment and Plan.  Objective  Well appearing patient in no apparent distress; mood and affect are within normal limits.  A focused examination was performed including left lower leg. Relevant physical exam findings are noted in the Assessment and Plan.  left ankle Indurated tender violaceous plaques   left distal medial pretibial  ~4 cm  Left distal lateral calf ~5 cm  Above left medial ankle ~4 cm         Assessment & Plan  Lipodermatosclerosis of left lower extremity - tender and progressive with new lesion formation despite treatment with: - daily graduated compression stockings - past intralesional steroid injections without result - vascular evaluation by Dr Lucky Cowboy without significant findings - therapeutic Ultrasound therapy without results. left ankle  Chronic and persistent  Dr Laurence Ferrari evaluated pt today with me. Discussed treatment options: - Pentoxifylline 400  mg tid - Prednisone po for 2 weeks - Stanazolol 2mg  bid for 3-5 mos - Danazol 100 mg qd - bid  - taper after disease control - Oxandrolone 10 mgbid - taper after disease control - Unna boots - Plaquenil tablets  Will start Pentoxifylline today Consider Prednisone po on fu if needed. Continue daily  graduated compression stockings and leg elevation  Related Medications pentoxifylline (TRENTAL) 400 MG CR tablet Take 1 tablet (400 mg total) by mouth 3 (three) times daily with meals.  Return in about 5 weeks (around 09/16/2020) for Lipodermatosclerosis.  IMarye Round, CMA, am acting as scribe for Sarina Ser, MD .  Documentation: I have reviewed the above documentation for accuracy and completeness, and I agree with the above.  Sarina Ser, MD

## 2020-08-12 NOTE — Patient Instructions (Signed)

## 2020-08-31 ENCOUNTER — Other Ambulatory Visit: Payer: Self-pay | Admitting: Family Medicine

## 2020-08-31 NOTE — Telephone Encounter (Signed)
Requested medication (s) are due for refill today: Yes  Requested medication (s) are on the active medication list: Yes  Last refill:  07/11/19  Future visit scheduled: Yes  Notes to clinic:  Unable to refill per protocol, Rx expired.      Requested Prescriptions  Pending Prescriptions Disp Refills   tolterodine (DETROL LA) 4 MG 24 hr capsule [Pharmacy Med Name: TOLTERODINE  4MG   CAP  EXTENDED RELEASE] 90 capsule 3    Sig: TAKE 1 CAPSULE BY MOUTH  DAILY      Urology:  Bladder Agents Passed - 08/31/2020  6:36 AM      Passed - Valid encounter within last 12 months    Recent Outpatient Visits           2 months ago Lipodermatosclerosis of left lower extremity   [facility] [name]., MD   5 months ago Annual physical exam   [facility] [name]., MD   7 months ago [place], [place]   12 months ago Nodule of skin of lower extremity   [facility] [name]., MD   1 year ago Annual physical exam   [facility] [name]., MD       Future Appointments             In 1 week Ralene Bathe, MD [place]   In 7 months [name]., MD [facility] [place]

## 2020-09-10 ENCOUNTER — Other Ambulatory Visit: Payer: Self-pay

## 2020-09-10 ENCOUNTER — Ambulatory Visit: Payer: Managed Care, Other (non HMO) | Admitting: Dermatology

## 2020-09-10 DIAGNOSIS — I8312 Varicose veins of left lower extremity with inflammation: Secondary | ICD-10-CM | POA: Diagnosis not present

## 2020-09-10 NOTE — Progress Notes (Signed)
   Follow-Up Visit   Subjective  Hannah Gonzalez is a 60 y.o. female who presents for the following: Follow-up (Patient here today for 1 month lipodermatosclerosis follow up. Patient using compression stockings and has been on pentoxifylline 400mg  tid for 1 month. Patient advises she is not sure if she has had any improvement. ).  Patient advises today they look better but that is not always the case. She is tolerating the pentoxifylline fine.   The following portions of the chart were reviewed this encounter and updated as appropriate:   Tobacco  Allergies  Meds  Problems  Med Hx  Surg Hx  Fam Hx      Review of Systems:  No other skin or systemic complaints except as noted in HPI or Assessment and Plan.  Objective  Well appearing patient in no apparent distress; mood and affect are within normal limits.  A focused examination was performed including lower extremities, including the legs, feet, toes, and toenails. Relevant physical exam findings are noted in the Assessment and Plan.  Left Lower Leg - Anterior Left anterior superior 3.5cm Left inferior 3.0cm Left posterior 4.5 x 2.0cm Violaceous indurated plaques, non-tender        Assessment & Plan  Lipodermatosclerosis of left lower extremity Chronic and persistent Improved but not to goal Left Lower Leg - Anterior  Improved - sites are smaller and nontender to palpation now. Pentoxifylline seems to have helped.  She did not see improvement with ultrasound.  She does continue compression stockings.  Continue compression daily Continue pentoxifylline 400mg  tid   Related Medications pentoxifylline (TRENTAL) 400 MG CR tablet Take 1 tablet (400 mg total) by mouth 3 (three) times daily with meals.  Return in about 3 months (around 12/11/2020) for lipodermatosclerosis.  Graciella Belton, RMA, am acting as scribe for Sarina Ser, MD . Documentation: I have reviewed the above documentation for accuracy and  completeness, and I agree with the above.  Sarina Ser, MD

## 2020-09-10 NOTE — Patient Instructions (Signed)

## 2020-09-12 ENCOUNTER — Encounter: Payer: Self-pay | Admitting: Dermatology

## 2020-09-21 ENCOUNTER — Ambulatory Visit: Payer: Self-pay

## 2020-09-21 ENCOUNTER — Ambulatory Visit: Payer: Managed Care, Other (non HMO) | Admitting: Family Medicine

## 2020-09-21 ENCOUNTER — Encounter: Payer: Self-pay | Admitting: Family Medicine

## 2020-09-21 ENCOUNTER — Other Ambulatory Visit: Payer: Self-pay

## 2020-09-21 VITALS — BP 143/73 | HR 116 | Temp 98.2°F | Ht 68.0 in | Wt 181.9 lb

## 2020-09-21 DIAGNOSIS — L237 Allergic contact dermatitis due to plants, except food: Secondary | ICD-10-CM | POA: Insufficient documentation

## 2020-09-21 DIAGNOSIS — D692 Other nonthrombocytopenic purpura: Secondary | ICD-10-CM

## 2020-09-21 DIAGNOSIS — I10 Essential (primary) hypertension: Secondary | ICD-10-CM | POA: Diagnosis not present

## 2020-09-21 DIAGNOSIS — I8312 Varicose veins of left lower extremity with inflammation: Secondary | ICD-10-CM

## 2020-09-21 HISTORY — DX: Allergic contact dermatitis due to plants, except food: L23.7

## 2020-09-21 MED ORDER — CLOBETASOL PROPIONATE 0.05 % EX OINT: 1.0000 "application " | TOPICAL_OINTMENT | Freq: Two times a day (BID) | CUTANEOUS | 0 refills | Status: DC

## 2020-09-21 MED ORDER — PREDNISONE 10 MG PO TABS: ORAL_TABLET | ORAL | 0 refills | Status: AC

## 2020-09-21 NOTE — Assessment & Plan Note (Signed)
Currently on treatment  Started on steroid today, cross referenced for possible interactions.  None known.

## 2020-09-21 NOTE — Assessment & Plan Note (Signed)
SBP >140 today; plan for recheck outside of acute condition Continue oral meds

## 2020-09-21 NOTE — Telephone Encounter (Signed)
Pt. Has poison ivy rash x 1 week, "getting worse and spreading." Both lower legs. Appointment made per [facility] for today.    Answer Assessment - Initial Assessment Questions 1. APPEARANCE of RASH: "Describe the rash."      Red circles, whelps, weeping 2. LOCATION: "Where is the rash located?"  (e.g., face, genitals, hands, legs)     Both legs 3. SIZE: "How large is the rash?"      Large 4. ONSET: "When did the rash begin?"      1 week ago 5. ITCHING: "Does the rash itch?" If Yes, ask: "How bad is it?"   - MILD - doesn't interfere with normal activities   - MODERATE-SEVERE: interferes with work, school, sleep, or other activities      Severe 6. EXPOSURE:  "How were you exposed to the plant (poison ivy, poison oak, sumac)"  "When were you exposed?"       Yes 7. PAST HISTORY: "Have you had a poison ivy rash before?" If Yes, ask: "How bad was it?"     No 8. PREGNANCY: "Is there any chance you are pregnant?" "When was your last menstrual period?"     No  Protocols used: [place] [facility]

## 2020-09-21 NOTE — Progress Notes (Signed)
Established patient visit   Patient: Hannah Gonzalez   DOB: 03-Sep-1960   60 y.o. Female  MRN: [id number] Visit Date: 09/21/2020  Today's healthcare provider: Gwyneth Sprout, FNP   No chief complaint on file.  Subjective    HPI  Poison Ivy first came in contact 9 days ago. Rash is worsening and spreading. Located on inner thighs, right inner lower extremity, toes on right foot.   Started out as little black spot; now center of the rash on the RLE. Put calamine or topical benadryl gives mild relief but eventually starts burning. Concerned it may spread due to when she washes creams off. Gets worse when she is hot and sweaty, does not itch as bad when she is able to stay cool. Taking baths in the middle of the night to cool off and help sensation. Also tried wearing long pajamas.  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 ant. thigh   Hx of dysplastic nevus 02/23/2009   Left buttocks, moderate   Hx of dysplastic nevus 11/23/2009   Right posterior superior thigh   Palpitations    Vitamin D deficiency    Allergies   Allergen Reactions   Aspirin Hives, Rash and Anaphylaxis       Medications: Outpatient Medications Prior to Visit  Medication Sig   Cetirizine HCl 10 MG CAPS Take 10 mg by mouth daily.    Cholecalciferol (VITAMIN D3) 1000 units CAPS Take 1,000 Units by mouth daily.    CRANBERRY PO Take 1 capsule by mouth daily.   metoprolol succinate (TOPROL-XL) 50 MG 24 hr tablet TAKE 1 TABLET BY MOUTH  DAILY WITH OR IMMEDIATLEY  FOLLOWING A MEAL   Multiple Vitamins-Minerals (ONE-A-DAY WOMENS 50 PLUS PO) Take 1 tablet by mouth daily.   OSPHENA 60 MG TABS TAKE 1 TABLET BY MOUTH DAILY   pentoxifylline (TRENTAL) 400 MG CR tablet Take 1 tablet (400 mg total) by mouth 3 (three) times daily with meals.   tolterodine (DETROL LA) 4 MG 24 hr capsule TAKE 1 CAPSULE BY MOUTH  DAILY   TURMERIC PO Take 1,000 mg by mouth.   [DISCONTINUED] Menthol, Topical Analgesic, (BLUE-EMU MAXIMUM STRENGTH EX) Apply 1 application topically 2 (two) times daily. (Patient not taking: Reported on 09/21/2020)   [DISCONTINUED] Omega-3 Fatty Acids (FISH OIL) 1200 MG CAPS Take 1,200 mg by mouth every other day. Alternating days with flaxseed oil   [DISCONTINUED] sulfamethoxazole-trimethoprim (BACTRIM DS) 800-160 MG tablet Take 1 tablet by mouth 2 (two) times daily.   No facility-administered medications prior to visit.    Review of Systems  Constitutional: Negative.   Skin:  Positive for rash.       R calf; R thigh; L thigh; L calf; R toes   Last CBC Lab Results  Component Value Date   WBC 5.5 06/16/2020   HGB 12.8 06/16/2020   HCT 38.2 06/16/2020   MCV 88 06/16/2020   MCH 29.6 06/16/2020   RDW 12.4 06/16/2020   PLT 251 123456   Last metabolic panel Lab Results  Component Value Date   GLUCOSE 98 06/16/2020   NA 141 06/16/2020   K 4.2 06/16/2020   CL 102 06/16/2020   CO2 22 06/16/2020   BUN 14 06/16/2020   CREATININE 0.95 06/16/2020   GFRNONAA 74 04/03/2019   GFRAA 86 04/03/2019   CALCIUM 9.0 06/16/2020   PROT  6.4 06/16/2020   ALBUMIN 4.2 06/16/2020   LABGLOB 2.2 06/16/2020   AGRATIO 1.9 06/16/2020   BILITOT 0.4 06/16/2020   ALKPHOS 58 06/16/2020   AST 19 06/16/2020   ALT 19 06/16/2020   ANIONGAP 11 01/23/2018   Last lipids Lab Results  Component Value Date   CHOL 238 (H) 06/16/2020   HDL 48 06/16/2020   LDLCALC 161 (H) 06/16/2020   TRIG 162 (H) 06/16/2020   CHOLHDL 5.0 (H) 06/16/2020   Last thyroid functions Lab Results  Component Value Date   TSH 3.480 06/16/2020     Objective    BP (!) 143/73   Pulse (!) 116   Temp 98.2 F (36.8 C) (Oral)   Ht '5\' 8"'$  (1.727 m)   Wt 181 lb 14.4 oz (82.5 kg)   SpO2 97%   BMI 27.66 kg/m  BP Readings from Last 3 Encounters:  09/21/20 (!) 143/73  07/21/20 (!) 141/86  06/30/20 122/83   Wt Readings from Last 3 Encounters:  09/21/20 181 lb 14.4 oz (82.5 kg)  07/21/20 183 lb (83 kg)  06/30/20 181 lb 6.4 oz (82.3 kg)       Physical Exam Vitals and nursing note reviewed.  Cardiovascular:     Rate and Rhythm: Regular rhythm. Tachycardia present.     Pulses: Normal pulses.     Heart sounds: Normal heart sounds. No murmur heard.   No friction rub. No gallop.  Musculoskeletal:        General: No swelling.     Right lower leg: No edema.     Left lower leg: No edema.  Skin:    Capillary Refill: Capillary refill takes 2 to 3 seconds.     Findings: Erythema and rash present.          Comments: Rash areas highlighted above.  Large, plaque like, erythematous lesion with small, oozing vesicles.   Neurological:     Mental Status: She is alert.  Psychiatric:        Mood and Affect: Mood normal.      No results found for any visits on 09/21/20.  Assessment & Plan     Problem List Items Addressed This Visit       Cardiovascular and Mediastinum   Essential hypertension    SBP >140 today; plan for recheck outside of acute condition Continue oral meds         Musculoskeletal and Integument   Lipodermatosclerosis of left lower  extremity    Currently on treatment  Started on steroid today, cross referenced for possible interactions.  None known.       Poison ivy dermatitis - Primary    7/23 exposure- known; immediately washed off 7/24 initial  rash- RLE 7/25-today RLE rash has expanded, including black spot at site of initial exposure; other areas include LLE        Relevant Medications   predniSONE (DELTASONE) 10 MG tablet   clobetasol ointment (TEMOVATE) 0.05 %   RESOLVED: Other nonthrombocytopenic purpura (HCC)     Return if symptoms worsen or fail to improve.     Vonna Kotyk, FNP, have reviewed all documentation for this visit. The documentation on 09/21/20 for the exam, diagnosis, procedures, and orders are all accurate and complete.    Gwyneth Sprout, [place] [phone] (phone) [phone] (fax)  [place]

## 2020-09-21 NOTE — Assessment & Plan Note (Signed)
7/23 exposure- known; immediately washed off 7/24 initial rash- RLE 7/25-today RLE rash has expanded, including black spot at site of initial exposure; other areas include LLE

## 2020-10-12 ENCOUNTER — Other Ambulatory Visit: Payer: Self-pay | Admitting: Dermatology

## 2020-10-12 DIAGNOSIS — I8312 Varicose veins of left lower extremity with inflammation: Secondary | ICD-10-CM

## 2020-11-03 ENCOUNTER — Other Ambulatory Visit: Payer: Self-pay

## 2020-11-03 ENCOUNTER — Ambulatory Visit: Payer: Managed Care, Other (non HMO) | Admitting: Cardiovascular Disease

## 2020-11-03 ENCOUNTER — Encounter: Payer: Self-pay | Admitting: Cardiovascular Disease

## 2020-11-03 VITALS — BP 130/82 | HR 114 | Ht 68.0 in | Wt 181.2 lb

## 2020-11-03 DIAGNOSIS — I10 Essential (primary) hypertension: Secondary | ICD-10-CM | POA: Diagnosis not present

## 2020-11-03 DIAGNOSIS — R000 Tachycardia, unspecified: Secondary | ICD-10-CM | POA: Diagnosis not present

## 2020-11-03 MED ORDER — METOPROLOL SUCCINATE ER 50 MG PO TB24: ORAL_TABLET | ORAL | 3 refills | Status: DC

## 2020-11-03 NOTE — Progress Notes (Signed)
Cardiology Office Note  Date:  11/03/2020   ID:  DEDIE VLIETSTRA, DOB 04-30-1960, MRN [id number]  PCP:  [name]., MD   Chief Complaint  Patient presents with   12 month follow up     Patient c/o tachycardia at times. Medications reviewed by the patient verbally.     HPI:  [name] is a 60 year old woman with history of  Paroxysmal tachycardia, arrhythmia dating back to when she was young, improved on metoprolol Hyperlipidemia Family hx of high cholesterol, mom is 17 yo no coronary calcification, no aortic atherosclerosis on CT scans 2015 and 2016 who presents for evaluation of her tachycardia.  And hyperlipidemia  LOV  09/2019 Doing well, Retired from [employer]  completed back surgery, hip replacement surgery Taking care of mother age 37,  Tolerating metoprolol 50 daily in AM, denies any significant tachycardia  Total cholesterol 238 Prefer not to be on medications  Prior CT scan chest images pulled up, no significant coronary calcification noted or aortic atherosclerosis  Did lifescreening, "All normal"  3 areas of fatty tissue growth left lower extremity Wears compressions Discomfort in her left lower extremity is limiting her exercise capacity  EKG personally reviewed by myself on todays visit Shows normal sinus rhythm with rate 114 bpm no significant ST or T wave changes Does not appreciate higher heart rate, does not check it at home  Other past medical history reviewed 30 day monitor reviewed with her showing normal sinus rhythm, no significant arrhythmia, average heart rate 80-90 bpm  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 Faith Rogue    mother has history of A. fib, father with pacemaker, died of a stroke Both parents have very high cholesterol    PMH:   has a past medical history of Arthritis, Basal cell carcinoma (05/04/2001), basal cell carcinoma (05/04/2001), dysplastic nevus (02/23/2009), dysplastic nevus  (11/23/2009), Palpitations, and Vitamin D deficiency.  PSH:    Past Surgical History:  Procedure Laterality Date   ANTERIOR CRUCIATE LIGAMENT REPAIR     ARTHROSCOPIC REPAIR ACL     left   BACK SURGERY     BIOPSY BREAST     left    BREAST BIOPSY Left 2010   benign-core   LAPAROSCOPY     MYOMECTOMY     PAROTIDECTOMY     THYMECTOMY     TONSILLECTOMY     TOTAL HIP ARTHROPLASTY Right 01/10/2018   Procedure: TOTAL HIP ARTHROPLASTY ANTERIOR APPROACH;  Surgeon: Lovell Sheehan, MD;  Location: [facility] ORS;  Service: Orthopedics;  Laterality: Right;    Current Outpatient Medications  Medication Sig Dispense Refill   Cetirizine HCl 10 MG CAPS Take 10 mg by mouth daily.      Cholecalciferol (VITAMIN D3) 1000 units CAPS Take 1,000 Units by mouth daily.      CRANBERRY PO Take 1 capsule by mouth daily.     Multiple Vitamins-Minerals (ONE-A-DAY WOMENS 50 PLUS PO) Take 1 tablet by mouth daily.     OSPHENA 60 MG TABS TAKE 1 TABLET BY MOUTH DAILY 30 tablet 11   pentoxifylline (TRENTAL) 400 MG CR tablet TAKE 1 TABLET BY MOUTH 3 TIMES DAILY WITH MEALS. 90 tablet 1   tolterodine (DETROL LA) 4 MG 24 hr capsule TAKE 1 CAPSULE BY MOUTH  DAILY 90 capsule 3   clobetasol ointment (TEMOVATE) [id number] % Apply 1 application topically 2 (two) times daily. (Patient not taking: Reported on 11/03/2020) 30 g 0   metoprolol succinate (TOPROL-XL)  50 MG 24 hr tablet Take with or immediately following a meal. 90 tablet 3   No current facility-administered medications for this visit.     Allergies:   Aspirin   Social History:  The patient  reports that she has never smoked. She has never used smokeless tobacco. She reports that she does not drink alcohol and does not use drugs.   Family History:   family history includes Arrhythmia in her mother; Arthritis in her father and mother; Breast cancer in her cousin and maternal aunt; Cancer in her maternal aunt and mother; Heart disease in her mother; Heart failure in her  mother; Hyperlipidemia in her brother and mother; Hypertension in her brother, father, and mother; Prostate cancer in her father.    Review of Systems: Review of Systems  Constitutional: Negative.   HENT: Negative.    Respiratory: Negative.    Cardiovascular: Negative.   Gastrointestinal: Negative.   Musculoskeletal: Negative.   Neurological: Negative.   Psychiatric/Behavioral: Negative.    All other systems reviewed and are negative.  PHYSICAL EXAM: VS:  BP 130/82 (BP Location: Left Arm, Patient Position: Sitting, Cuff Size: Normal)   Pulse (!) 114   Ht '5\' 8"'$  (1.727 m)   Wt 181 lb 4 oz (82.2 kg)   SpO2 97%   BMI 27.56 kg/m  , BMI Body mass index is 27.56 kg/m. Constitutional:  oriented to person, place, and time. No distress.  HENT:  Head: Grossly normal Eyes:  no discharge. No scleral icterus.  Neck: No JVD, no carotid bruits  Cardiovascular: Regular rate and rhythm, no murmurs appreciated Pulmonary/Chest: Clear to auscultation bilaterally, no wheezes or rails Abdominal: Soft.  no distension.  no tenderness.  Musculoskeletal: Normal range of motion Fatty lesions left LE Neurological:  normal muscle tone. Coordination normal. No atrophy Skin: Skin warm and dry Psychiatric: normal affect, pleasant   Recent Labs: 06/16/2020: ALT 19; BUN 14; Creatinine, Ser 0.95; Hemoglobin 12.8; Platelets 251; Potassium 4.2; Sodium 141; TSH 3.480    Lipid Panel Lab Results  Component Value Date   CHOL 238 (H) 06/16/2020   HDL 48 06/16/2020   LDLCALC 161 (H) 06/16/2020   TRIG 162 (H) 06/16/2020      Wt Readings from Last 3 Encounters:  11/03/20 181 lb 4 oz (82.2 kg)  09/21/20 181 lb 14.4 oz (82.5 kg)  07/21/20 183 lb (83 kg)      ASSESSMENT AND PLAN:  Tachycardia - Plan: EKG 12-Lead Will continue metoprolol succinate 50 daily Extra 1/2 dose for breakthrough  Mixed hyperlipidemia - Plan: EKG 12-Lead No significant coronary calcification on prior CT scans No aortic  atherosclerosis on imaging Long history of high cholesterol Previously Discussed a CT coronary calcium scoring if she would like Previously declined statin  Essential hypertension -  Blood pressure is well controlled on today's visit. No changes made to the medications.  Stress/adjustment disorder works at [employer] taking care of elderly mother 51 years old who lives next door   Total encounter time more than 25 minutes  Greater than 50% was spent in counseling and coordination of care with the patient    Orders Placed This Encounter  Procedures   EKG 12-Lead     Signed, Esmond Plants, M.D., Ph.D. 11/03/2020  [place], [place]

## 2020-11-03 NOTE — Patient Instructions (Addendum)
Medication Instructions:  No changes  If you need a refill on your cardiac medications before your next appointment, please call your pharmacy.    Lab work: No new labs needed   If you have labs (blood work) drawn today and your tests are completely normal, you will receive your results only by: Hannah Gonzalez (if you have MyChart) OR A paper copy in the mail If you have any lab test that is abnormal or we need to change your treatment, we will call you to review the results.   Testing/Procedures: No new testing needed   Follow-Up: At [facility], you and your health needs are our priority.  As part of our continuing mission to provide you with exceptional heart care, we have created designated Provider Care Teams.  These Care Teams include your primary Cardiologist (physician) and Advanced Practice Providers (APPs -  Physician Assistants and Nurse Practitioners) who all work together to provide you with the care you need, when you need it.  You will need a follow up appointment as needed  Providers on your designated Care Team:   Murray Hodgkins, NP Christell Faith, PA-C Marrianne Mood, PA-C Cadence Kathlen Mody, [place]  Any Other Special Instructions Will Be Listed Below (If Applicable).  COVID-19 Vaccine Information can be found at: [email or web address].uk For questions related to vaccine distribution or appointments, please email [email or web address] or call [phone].

## 2020-12-08 ENCOUNTER — Ambulatory Visit: Payer: Managed Care, Other (non HMO) | Admitting: Dermatology

## 2020-12-08 ENCOUNTER — Encounter: Payer: Self-pay | Admitting: Dermatology

## 2020-12-08 ENCOUNTER — Other Ambulatory Visit: Payer: Self-pay

## 2020-12-08 DIAGNOSIS — I8312 Varicose veins of left lower extremity with inflammation: Secondary | ICD-10-CM

## 2020-12-08 MED ORDER — DANAZOL 100 MG PO CAPS: 100.0000 mg | ORAL_CAPSULE | Freq: Every day | ORAL | 0 refills | Status: DC

## 2020-12-08 NOTE — Patient Instructions (Signed)

## 2020-12-08 NOTE — Progress Notes (Signed)
   Follow-Up Visit   Subjective  Hannah Gonzalez is a 60 y.o. female who presents for the following: lipodermatosclerosis (Of the L lower leg - patient currently using Trental 400mg  po TID, but hasn't noticed an improvement in condition. She is not using topical medications on aa's. Lesion are tender to the touch at times.).  The following portions of the chart were reviewed this encounter and updated as appropriate:   Tobacco  Allergies  Meds  Problems  Med Hx  Surg Hx  Fam Hx     Review of Systems:  No other skin or systemic complaints except as noted in HPI or Assessment and Plan.  Objective  Well appearing patient in no apparent distress; mood and affect are within normal limits.  A focused examination was performed including the lower legs. Relevant physical exam findings are noted in the Assessment and Plan.  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   Patient has failed graduated compression stocking use; intralesional steroid injections,: Topical steroid treatment; therapeutic ultrasound treatments; pentoxifylline oral treatment.  She has been evaluated by vascular  -who did not feel there was any significant vascular/venous abnormality and did not have any further recommendations.   Patient was prescribed Prednisone taper x 3 weeks for poison ivy which coincidentally improved condition. She did experience s/e of GI upsets and difficulty sleeping.  She does not want to use systemic steroids unless absolutely necessary.  D/C Pentoxifylline since ineffective.   Discussed Danazol a synthetic  anabolic steroid and fibrinolytic agent  - will monitor for hepatotoxicity and increased cholesterol and sodium retention.  Start Danazol 100mg  po QD #30 1RF.   Recommend Zostrix (capsaicin) cream to aa's TID for pain.   Plan labs including ANA; SCL70 etc. at f/u appt.   danazol (DANOCRINE) 100 MG capsule - L lower leg Take 1 capsule (100 mg total) by mouth daily.  Related Medications pentoxifylline (TRENTAL) 400 MG CR tablet TAKE 1 TABLET BY MOUTH 3 TIMES DAILY WITH MEALS.  Return in about 1 month (around 01/08/2021) for lipodermatosclerosis f/u .  Luther Redo, CMA, am acting as scribe for Sarina Ser, MD . Documentation: I have reviewed the above documentation for accuracy and completeness, and I agree with the above.  Sarina Ser, MD

## 2021-01-11 ENCOUNTER — Other Ambulatory Visit: Payer: Self-pay

## 2021-01-11 ENCOUNTER — Ambulatory Visit: Payer: Managed Care, Other (non HMO) | Admitting: Dermatology

## 2021-01-11 DIAGNOSIS — I8312 Varicose veins of left lower extremity with inflammation: Secondary | ICD-10-CM | POA: Diagnosis not present

## 2021-01-11 MED ORDER — DANAZOL 100 MG PO CAPS: 200.0000 mg | ORAL_CAPSULE | Freq: Every day | ORAL | 1 refills | Status: DC

## 2021-01-11 NOTE — Progress Notes (Signed)
   Follow-Up Visit   Subjective  Hannah Gonzalez is a 60 y.o. female who presents for the following: Follow-up (Lipodermatosclerosis of left lower leg is about the same. She is taking Danazol 100 mg 1 po qd. She is not using Zostrix cream. Area is not as tender as it was before.).  The following portions of the chart were reviewed this encounter and updated as appropriate:   Tobacco  Allergies  Meds  Problems  Med Hx  Surg Hx  Fam Hx     Review of Systems:  No other skin or systemic complaints except as noted in HPI or Assessment and Plan.  Objective  Well appearing patient in no apparent distress; mood and affect are within normal limits.  A focused examination was performed including left leg. Relevant physical exam findings are noted in the Assessment and Plan.  Left Lower Leg - Anterior Violaceous indurated plaques - tender           Assessment & Plan  Lipodermatosclerosis of left lower extremity Left Lower Leg - Anterior  Chronic and persistent over 1 year - bx proven 08/2019,  Painful and symptomatic - not improving on any treatment in past and not improving with oral Danazol for the past month. not to goal  Patient has failed graduated compression stocking use; intralesional steroid injections,: Topical steroid treatment; therapeutic ultrasound treatments; pentoxifylline oral treatment.  She has been evaluated by vascular  -who did not feel there was any significant vascular/venous abnormality and did not have any further recommendations.   Patient was prescribed Prednisone taper x 3 weeks for poison ivy which coincidentally improved condition. She did experience s/e of GI upsets and difficulty sleeping.  She does not want to use systemic steroids unless absolutely necessary.  Will check labs - ANA, SCL-70, hepatic function panel and lipid panel.  Increase Danazol 100 mg 2 po qd.  May recommend evaluation at [facility] if not improving with increased  medication.  Scleroderma Diagnostic Profile - Left Lower Leg - Anterior  Hepatic Function Panel - Left Lower Leg - Anterior  Lipid Panel - Left Lower Leg - Anterior  danazol (DANOCRINE) 100 MG capsule - Left Lower Leg - Anterior Take 2 capsules (200 mg total) by mouth daily.  Return in about 1 month (around 02/10/2021).  I, Ashok Cordia, CMA, am acting as scribe for Sarina Ser, MD . Documentation: I have reviewed the above documentation for accuracy and completeness, and I agree with the above.  Sarina Ser, MD

## 2021-01-11 NOTE — Patient Instructions (Signed)

## 2021-01-13 LAB — LIPID PANEL
Chol/HDL Ratio: 7.3 ratio — ABNORMAL HIGH (ref 0.0–4.4)
Cholesterol, Total: 232 mg/dL — ABNORMAL HIGH (ref 100–199)
HDL: 32 mg/dL — ABNORMAL LOW (ref >39)
LDL Chol Calc (NIH): 174 mg/dL — ABNORMAL HIGH (ref 0–99)
Triglycerides: 139 mg/dL (ref 0–149)
VLDL Cholesterol Cal: 26 mg/dL (ref 5–40)

## 2021-01-13 LAB — HEPATIC FUNCTION PANEL
ALT: 16 IU/L (ref 0–32)
AST: 20 IU/L (ref 0–40)
Albumin: 4.5 g/dL (ref 3.8–4.8)
Alkaline Phosphatase: 53 IU/L (ref 44–121)
Bilirubin Total: 0.4 mg/dL (ref 0.0–1.2)
Bilirubin, Direct: <0.1 mg/dL (ref 0.00–0.40)
Total Protein: 6.4 g/dL (ref 6.0–8.5)

## 2021-01-13 LAB — SCLERODERMA DIAGNOSTIC PROFILE
Anti Nuclear Antibody (ANA): NEGATIVE
Scleroderma (Scl-70) (ENA) Antibody, IgG: <0.2 AI (ref 0.0–0.9)

## 2021-01-21 ENCOUNTER — Encounter: Payer: Self-pay | Admitting: Dermatology

## 2021-01-22 ENCOUNTER — Other Ambulatory Visit: Payer: Self-pay | Admitting: Family Medicine

## 2021-01-22 ENCOUNTER — Telehealth: Payer: Self-pay

## 2021-01-22 NOTE — Telephone Encounter (Signed)
Patient informed of lab results. She doesn't want to continue the Danazol due to potential side effects and would like to be referred to [facility] to discuss other treatment options. Message sent to Dr. Nehemiah Massed to [place] sending referral.

## 2021-01-22 NOTE — Telephone Encounter (Signed)
-----   Message from Ralene Bathe, MD sent at 01/19/2021  2:44 PM EST ----- Labs from 01/12/2021 are OK: - ANA and SCL-70 are normal - Liver tests normal - Lipids elevated, but consistent with elevations over past 4 years  Continue Danazol as prescribed. Keep follow up appt January 2023.

## 2021-01-23 NOTE — Telephone Encounter (Signed)
Sent to prescriber. Prescriber not at this practice. Needs to change pharmacy to [facility] home delivery. Requested Prescriptions  Pending Prescriptions Disp Refills   metoprolol succinate (TOPROL-XL) 50 MG 24 hr tablet [Pharmacy Med Name: Metoprolol Succinate ER 50 MG Oral Tablet Extended Release 24 Hour] 90 tablet 3    Sig: TAKE 1 TABLET BY MOUTH  DAILY WITH OR IMMEDIATLEY  FOLLOWING A MEAL     Cardiovascular:  Beta Blockers Failed - 01/22/2021  4:34 AM      Failed - Last Heart Rate in normal range    Pulse Readings from Last 1 Encounters:  11/03/20 (!) 114          Passed - Last BP in normal range    BP Readings from Last 1 Encounters:  11/03/20 130/82          Passed - Valid encounter within last 6 months    Recent Outpatient Visits           4 months ago [place] Tally Joe T, FNP   7 months ago Lipodermatosclerosis of left lower extremity   [facility] [name]., MD   9 months ago Annual physical exam   [facility] [name]., MD   1 year ago [place] [facility], [place]   1 year ago Nodule of skin of lower extremity   [facility] [name]., MD       Future Appointments             In 1 month Ralene Bathe, MD [place]   In 2 months [name]., MD [facility], [place]   In 3 months Ralene Bathe, MD [place]

## 2021-01-24 ENCOUNTER — Encounter: Payer: Self-pay | Admitting: Dermatology

## 2021-03-17 ENCOUNTER — Ambulatory Visit: Payer: Managed Care, Other (non HMO) | Admitting: Dermatology

## 2021-04-02 NOTE — Progress Notes (Signed)
Complete physical exam  I,Hannah Gonzalez,acting as a scribe for Hannah Durie, MD.,have documented all relevant documentation on the behalf of Hannah Durie, MD,as directed by  Hannah Durie, MD while in the presence of Hannah Durie, MD.   Patient: Hannah Gonzalez   DOB: 02-04-1961   61 y.o. Female  MRN: [id number] Visit Date: 04/05/2021  Today's healthcare provider: [name], MD   Chief Complaint  Patient presents with   Annual Exam   Subjective    Hannah Gonzalez is a 61 y.o. female who presents today for a complete physical exam.  She reports consuming a general diet.  Patient is in PT  She generally feels fairly well. She reports sleeping fairly well. She does not have additional problems to discuss today.  Patient is now retired from [employer] but has a lot of stress as she is the main caregiver for her 62 year old mother who lives next door to her. HPI   Past Medical History:  Diagnosis Date   Arthritis    Basal cell carcinoma 05/04/2001   Basal cell left thigh   Hx of basal cell carcinoma 05/04/2001   L sup. ant. thigh   Hx of dysplastic nevus 02/23/2009   Left buttocks, moderate   Hx of dysplastic nevus 11/23/2009   Right posterior superior thigh   Palpitations    Vitamin D deficiency    Past Surgical History:  Procedure Laterality Date   ANTERIOR CRUCIATE LIGAMENT REPAIR     ARTHROSCOPIC REPAIR ACL     left   BACK SURGERY     BIOPSY BREAST     left    BREAST BIOPSY Left 2010   benign-core   LAPAROSCOPY     MYOMECTOMY     PAROTIDECTOMY     THYMECTOMY     TONSILLECTOMY     TOTAL HIP ARTHROPLASTY Right 01/10/2018   Procedure: TOTAL HIP ARTHROPLASTY ANTERIOR APPROACH;  Surgeon: Lovell Sheehan, MD;  Location: [facility] ORS;  Service: Orthopedics;  Laterality: Right;   Social History   Socioeconomic History   Marital status: Married    Spouse name: Shanon Brow   Number of children: 0   Years of education: associates    Highest education level: Not on file  Occupational History    Employer: [employer]  Tobacco Use   Smoking status: Never   Smokeless tobacco: Never  Vaping Use   Vaping Use: Never used  Substance and Sexual Activity   Alcohol use: No   Drug use: No   Sexual activity: Not Currently    Birth control/protection: Post-menopausal  Other Topics Concern   Not on file  Social History Narrative   Not on file   Social Determinants of Health   Financial Resource Strain: Not on file  Food Insecurity: Not on file  Transportation Needs: Not on file  Physical Activity: Not on file  Stress: Not on file  Social Connections: Not on file  Intimate Partner Violence: Not on file   Family Status  Relation Name Status   Mother  Alive   Brother  Alive   Father  Deceased       pacemaker implant   Cousin maternal Alive   Mat Aunt  (Not Specified)   Family History  Problem Relation Age of Onset   Hypertension Mother    Hyperlipidemia Mother    Heart disease Mother    Heart failure Mother    Arrhythmia Mother  A-Fib   Arthritis Mother    Cancer Mother    Hypertension Brother    Hyperlipidemia Brother    Arthritis Father    Hypertension Father    Prostate cancer Father    Breast cancer Cousin    Cancer Maternal Aunt    Breast cancer Maternal Aunt    Allergies  Allergen Reactions   Aspirin Hives, Rash and Anaphylaxis    Patient Care Team: [name]., MD as PCP - General (Family Medicine) Rockey Situ Kathlene November, MD as Consulting Physician (Cardiology)   Medications: Outpatient Medications Prior to Visit  Medication Sig   Cetirizine HCl 10 MG CAPS Take 10 mg by mouth daily.    Cholecalciferol (VITAMIN D3) 1000 units CAPS Take 1,000 Units by mouth daily.    CRANBERRY PO Take 1 capsule by mouth daily.   folic acid (FOLVITE) 1 MG tablet Take by mouth.   methotrexate (RHEUMATREX) 2.5 MG tablet Take by mouth.   metoprolol succinate (TOPROL-XL) 50 MG 24 hr tablet Take 1  tablet (50 mg total) by mouth daily.   Multiple Vitamins-Minerals (ONE-A-DAY WOMENS 50 PLUS PO) Take 1 tablet by mouth daily.   OSPHENA 60 MG TABS TAKE 1 TABLET BY MOUTH DAILY   tolterodine (DETROL LA) 4 MG 24 hr capsule TAKE 1 CAPSULE BY MOUTH  DAILY   [DISCONTINUED] clobetasol ointment (TEMOVATE) 2.35 % Apply 1 application topically 2 (two) times daily. (Patient not taking: Reported on 12/08/2020)   [DISCONTINUED] danazol (DANOCRINE) 100 MG capsule Take 2 capsules (200 mg total) by mouth daily. (Patient not taking: Reported on 04/05/2021)   No facility-administered medications prior to visit.    Review of Systems  All other systems reviewed and are negative.     Objective    BP 128/82 (BP Location: Left Arm, Patient Position: Sitting, Cuff Size: Large)    Pulse 92    Temp 98.7 F (37.1 C) (Temporal)    Resp 14    Ht 5\' 8"  (1.727 m)    Wt 187 lb (84.8 kg)    SpO2 95%    BMI 28.43 kg/m  BP Readings from Last 3 Encounters:  04/05/21 128/82  11/03/20 130/82  09/21/20 (!) 143/73   Wt Readings from Last 3 Encounters:  04/05/21 187 lb (84.8 kg)  11/03/20 181 lb 4 oz (82.2 kg)  09/21/20 181 lb 14.4 oz (82.5 kg)      Physical Exam Vitals reviewed.  Constitutional:      Appearance: Normal appearance. She is well-developed.  HENT:     Head: Normocephalic and atraumatic.     Right Ear: External ear normal.     Left Ear: External ear normal.     Nose: Nose normal.  Eyes:     General: No scleral icterus.    Conjunctiva/sclera: Conjunctivae normal.  Neck:     Thyroid: No thyromegaly.  Cardiovascular:     Rate and Rhythm: Normal rate and regular rhythm.     Heart sounds: Normal heart sounds.  Pulmonary:     Effort: Pulmonary effort is normal.     Breath sounds: Normal breath sounds.  Abdominal:     Palpations: Abdomen is soft.  Musculoskeletal:     Right lower leg: No edema.     Left lower leg: No edema.  Lymphadenopathy:     Cervical: No cervical adenopathy.  Skin:     General: Skin is warm and dry.     Comments: Fair skin.  Neurological:     General:  No focal deficit present.     Mental Status: She is alert and oriented to person, place, and time.  Psychiatric:        Mood and Affect: Mood normal.        Behavior: Behavior normal.        Thought Content: Thought content normal.        Judgment: Judgment normal.      Last depression screening scores PHQ 2/9 Scores 04/05/2021 04/01/2020 04/01/2020  PHQ - 2 Score 0 0 0  PHQ- 9 Score 2 0 0   Last fall risk screening Fall Risk  04/05/2021  Falls in the past year? 0  Number falls in past yr: 0  Injury with Fall? 0  Risk for fall due to : No Fall Risks  Follow up Falls evaluation completed   Last Audit-C alcohol use screening Alcohol Use Disorder Test (AUDIT) 04/05/2021  1. How often do you have a drink containing alcohol? 0  2. How many drinks containing alcohol do you have on a typical day when you are drinking? 0  3. How often do you have six or more drinks on one occasion? 0  AUDIT-C Score 0  Alcohol Brief Interventions/Follow-up -   A score of 3 or more in women, and 4 or more in men indicates increased risk for alcohol abuse, EXCEPT if all of the points are from question 1   No results found for any visits on 04/05/21.  Assessment & Plan    Routine Health Maintenance and Physical Exam  Exercise Activities and Dietary recommendations  Goals   None     Immunization History  Administered Date(s) Administered   Influenza Inj Mdck Quad Pf 11/17/2018   Influenza Split 11/22/2015   Influenza Whole 11/21/2016   Influenza-Unspecified 12/03/2014, 11/04/2020   Td 09/21/2015   Tdap 08/30/2005    Health Maintenance  Topic Date Due   Zoster Vaccines- Shingrix (1 of 2) Never done   PAP SMEAR-Modifier  08/09/2021   MAMMOGRAM  05/06/2022   COLONOSCOPY (Pts 45-61yrs Insurance coverage will need to be confirmed)  05/18/2025   TETANUS/TDAP  09/20/2025   INFLUENZA VACCINE  Completed   Hepatitis  C Screening  Completed   HIV Screening  Completed   HPV VACCINES  Aged Out    Discussed health benefits of physical activity, and encouraged her to engage in regular exercise appropriate for her age and condition.  .1. Annual physical exam Diet and exercise stressed.  - Lipid panel - TSH - CBC w/Diff/Platelet - Comprehensive Metabolic Panel (CMET)  2. Essential hypertension Follow-up in 6 months for blood pressure and to check her stress level, PHQ 9 on next visit. - Lipid panel - TSH - CBC w/Diff/Plateletprov  - Comprehensive Metabolic Panel (CMET)  3. Hypercholesteremia  - Lipid panel - TSH - CBC w/Diff/Platelet - Comprehensive Metabolic Panel (CMET)  4. Urinary frequency  - POCT urinalysis dipstick--Small amount of Leukocytes  - CULTURE, URINE COMPREHENSIVE    Return in about 6 months (around 10/03/2021).     I, Hannah Durie, MD, have reviewed all documentation for this visit. The documentation on 04/09/21 for the exam, diagnosis, procedures, and orders are all accurate and complete.    Haeven Nickle Cranford Mon, MD  [facility] [phone] (phone) [phone] (fax)  [place]

## 2021-04-05 ENCOUNTER — Encounter: Payer: Self-pay | Admitting: Family Medicine

## 2021-04-05 ENCOUNTER — Other Ambulatory Visit: Payer: Self-pay | Admitting: Family Medicine

## 2021-04-05 ENCOUNTER — Ambulatory Visit (INDEPENDENT_AMBULATORY_CARE_PROVIDER_SITE_OTHER): Payer: Managed Care, Other (non HMO) | Admitting: Family Medicine

## 2021-04-05 ENCOUNTER — Other Ambulatory Visit: Payer: Self-pay

## 2021-04-05 VITALS — BP 128/82 | HR 92 | Temp 98.7°F | Resp 14 | Ht 68.0 in | Wt 187.0 lb

## 2021-04-05 DIAGNOSIS — R350 Frequency of micturition: Secondary | ICD-10-CM

## 2021-04-05 DIAGNOSIS — I10 Essential (primary) hypertension: Secondary | ICD-10-CM

## 2021-04-05 DIAGNOSIS — E7800 Pure hypercholesterolemia, unspecified: Secondary | ICD-10-CM | POA: Diagnosis not present

## 2021-04-05 DIAGNOSIS — Z1231 Encounter for screening mammogram for malignant neoplasm of breast: Secondary | ICD-10-CM

## 2021-04-05 DIAGNOSIS — Z0000 Encounter for general adult medical examination without abnormal findings: Secondary | ICD-10-CM

## 2021-04-06 LAB — POCT URINALYSIS DIPSTICK
Bilirubin, UA: NEGATIVE
Blood, UA: NEGATIVE
Glucose, UA: NEGATIVE
Ketones, UA: NEGATIVE
Nitrite, UA: NEGATIVE
Protein, UA: NEGATIVE
Spec Grav, UA: 1.02 (ref 1.010–1.025)
Urobilinogen, UA: 0.2 E.U./dL
pH, UA: 7 (ref 5.0–8.0)

## 2021-04-07 LAB — CBC WITH DIFFERENTIAL/PLATELET
Basophils Absolute: 0.1 10*3/uL (ref 0.0–0.2)
Basos: 1 %
EOS (ABSOLUTE): 0.3 10*3/uL (ref 0.0–0.4)
Eos: 4 %
Hematocrit: 43.5 % (ref 34.0–46.6)
Hemoglobin: 14.6 g/dL (ref 11.1–15.9)
Immature Grans (Abs): 0 10*3/uL (ref 0.0–0.1)
Immature Granulocytes: 0 %
Lymphocytes Absolute: 3.1 10*3/uL (ref 0.7–3.1)
Lymphs: 48 %
MCH: 29.9 pg (ref 26.6–33.0)
MCHC: 33.6 g/dL (ref 31.5–35.7)
MCV: 89 fL (ref 79–97)
Monocytes Absolute: 0.4 10*3/uL (ref 0.1–0.9)
Monocytes: 7 %
Neutrophils Absolute: 2.6 10*3/uL (ref 1.4–7.0)
Neutrophils: 40 %
Platelets: 247 10*3/uL (ref 150–450)
RBC: 4.88 x10E6/uL (ref 3.77–5.28)
RDW: 13.2 % (ref 11.7–15.4)
WBC: 6.4 10*3/uL (ref 3.4–10.8)

## 2021-04-07 LAB — COMPREHENSIVE METABOLIC PANEL
ALT: 37 IU/L — ABNORMAL HIGH (ref 0–32)
AST: 31 IU/L (ref 0–40)
Albumin/Globulin Ratio: 2.5 — ABNORMAL HIGH (ref 1.2–2.2)
Albumin: 4.7 g/dL (ref 3.8–4.8)
Alkaline Phosphatase: 61 IU/L (ref 44–121)
BUN/Creatinine Ratio: 13 (ref 12–28)
BUN: 13 mg/dL (ref 8–27)
Bilirubin Total: 0.5 mg/dL (ref 0.0–1.2)
CO2: 26 mmol/L (ref 20–29)
Calcium: 9.5 mg/dL (ref 8.7–10.3)
Chloride: 103 mmol/L (ref 96–106)
Creatinine, Ser: 1.02 mg/dL — ABNORMAL HIGH (ref 0.57–1.00)
Globulin, Total: 1.9 g/dL (ref 1.5–4.5)
Glucose: 98 mg/dL (ref 70–99)
Potassium: 4.5 mmol/L (ref 3.5–5.2)
Sodium: 143 mmol/L (ref 134–144)
Total Protein: 6.6 g/dL (ref 6.0–8.5)
eGFR: 63 mL/min/{1.73_m2} (ref >59)

## 2021-04-07 LAB — LIPID PANEL
Chol/HDL Ratio: 6.2 ratio — ABNORMAL HIGH (ref 0.0–4.4)
Cholesterol, Total: 260 mg/dL — ABNORMAL HIGH (ref 100–199)
HDL: 42 mg/dL (ref >39)
LDL Chol Calc (NIH): 181 mg/dL — ABNORMAL HIGH (ref 0–99)
Triglycerides: 195 mg/dL — ABNORMAL HIGH (ref 0–149)
VLDL Cholesterol Cal: 37 mg/dL (ref 5–40)

## 2021-04-07 LAB — TSH: TSH: 3.92 u[IU]/mL (ref 0.450–4.500)

## 2021-04-09 LAB — CULTURE, URINE COMPREHENSIVE

## 2021-04-27 ENCOUNTER — Ambulatory Visit: Payer: Managed Care, Other (non HMO) | Admitting: Dermatology

## 2021-04-27 ENCOUNTER — Other Ambulatory Visit: Payer: Self-pay

## 2021-04-27 DIAGNOSIS — I8312 Varicose veins of left lower extremity with inflammation: Secondary | ICD-10-CM

## 2021-04-27 DIAGNOSIS — L821 Other seborrheic keratosis: Secondary | ICD-10-CM

## 2021-04-27 DIAGNOSIS — Z85828 Personal history of other malignant neoplasm of skin: Secondary | ICD-10-CM | POA: Diagnosis not present

## 2021-04-27 DIAGNOSIS — L570 Actinic keratosis: Secondary | ICD-10-CM | POA: Diagnosis not present

## 2021-04-27 DIAGNOSIS — D229 Melanocytic nevi, unspecified: Secondary | ICD-10-CM

## 2021-04-27 DIAGNOSIS — L578 Other skin changes due to chronic exposure to nonionizing radiation: Secondary | ICD-10-CM

## 2021-04-27 DIAGNOSIS — Z1283 Encounter for screening for malignant neoplasm of skin: Secondary | ICD-10-CM

## 2021-04-27 DIAGNOSIS — Z86018 Personal history of other benign neoplasm: Secondary | ICD-10-CM

## 2021-04-27 DIAGNOSIS — L719 Rosacea, unspecified: Secondary | ICD-10-CM

## 2021-04-27 DIAGNOSIS — L814 Other melanin hyperpigmentation: Secondary | ICD-10-CM

## 2021-04-27 DIAGNOSIS — D1800 Hemangioma unspecified site: Secondary | ICD-10-CM

## 2021-04-27 NOTE — Progress Notes (Signed)
? ?Follow-Up Visit ?  ?Subjective  ?Hannah Gonzalez is a 61 y.o. female who presents for the following: Annual Exam (Hx BCC, dysplastic nevi - hx lipodermatosclerosis patient currently taking MTX 2.'5mg'$  2 po QD on Sunday and Folic acid 3po QD except Sunday. Pt c/o acid reflux and malaise but is unsure if related to MTX or folic acid. Patient has noticed a scaly lesion on her nose and an irregular appearing on the R forearm that she would like checked today. ). The patient presents for Total-Body Skin Exam (TBSE) for skin cancer screening and mole check.  The patient has spots, moles and lesions to be evaluated, some may be new or changing. ? ?The following portions of the chart were reviewed this encounter and updated as appropriate:  ? Tobacco  Allergies  Meds  Problems  Med Hx  Surg Hx  Fam Hx   ?  ?Review of Systems:  No other skin or systemic complaints except as noted in HPI or Assessment and Plan. ? ?Objective  ?Well appearing patient in no apparent distress; mood and affect are within normal limits. ? ?A full examination was performed including scalp, head, eyes, ears, nose, lips, neck, chest, axillae, abdomen, back, buttocks, bilateral upper extremities, bilateral lower extremities, hands, feet, fingers, toes, fingernails, and toenails. All findings within normal limits unless otherwise noted below. ? ?L lower leg ?Induration  ? ?Face ?Mid face erythema with telangiectasias +/- scattered inflammatory papules.  ? ?R nasal bridge x 1 ?Erythematous thin papules/macules with gritty scale.  ? ? ?Assessment & Plan  ?Lipodermatosclerosis of left lower extremity ?Chronic and persistent condition with duration or expected duration over one year. Condition is symptomatic/ bothersome to patient. Not currently at goal. ?L lower leg ?Being followed by [facility] - on MTX and folic acid, symptoms improving, but persistent thickening. Continue care with [facility].  ? ?Rosacea ?Face ?Rosacea is a chronic progressive skin  condition usually affecting the face of adults, causing redness and/or acne bumps. It is treatable but not curable. It sometimes affects the eyes (ocular rosacea) as well. It may respond to topical and/or systemic medication and can flare with stress, sun exposure, alcohol, exercise and some foods.  Daily application of broad spectrum spf 30+ sunscreen to face is recommended to reduce flares. ? ?Discussed the treatment option of BBL/laser.  Typically we recommend 1-3 treatment sessions about 5-8 weeks apart for best results.  The patient's condition may require "maintenance treatments" in the future.  The fee for BBL / laser treatments is $350 per treatment session for the whole face.  A fee can be quoted for other parts of the body. ?Insurance typically does not pay for BBL/laser treatments and therefore the fee is an out-of-pocket cost. ? ?AK (actinic keratosis) ?R nasal bridge x 1 ?Destruction of lesion - R nasal bridge x 1 ?Complexity: simple   ?Destruction method: cryotherapy   ?Informed consent: discussed and consent obtained   ?Timeout:  patient name, date of birth, surgical site, and procedure verified ?Lesion destroyed using liquid nitrogen: Yes   ?Region frozen until ice ball extended beyond lesion: Yes   ?Outcome: patient tolerated procedure well with no complications   ?Post-procedure details: wound care instructions given   ? ?Lentigines ?- Scattered tan macules ?- Due to sun exposure ?- Benign-appearing, observe ?- Recommend daily broad spectrum sunscreen SPF 30+ to sun-exposed areas, reapply every 2 hours as needed. ?- Call for any changes ? ?Seborrheic Keratoses ?- Stuck-on, waxy, tan-brown papules and/or plaques  ?-  Benign-appearing ?- Discussed benign etiology and prognosis. ?- Observe ?- Call for any changes ? ?Melanocytic Nevi ?- Tan-brown and/or pink-flesh-colored symmetric macules and papules ?- Benign appearing on exam today ?- Observation ?- Call clinic for new or changing moles ?- Recommend  daily use of broad spectrum spf 30+ sunscreen to sun-exposed areas.  ? ?Hemangiomas ?- Red papules ?- Discussed benign nature ?- Observe ?- Call for any changes ? ?Actinic Damage ?- Chronic condition, secondary to cumulative UV/sun exposure ?- diffuse scaly erythematous macules with underlying dyspigmentation ?- Recommend daily broad spectrum sunscreen SPF 30+ to sun-exposed areas, reapply every 2 hours as needed.  ?- Staying in the shade or wearing long sleeves, sun glasses (UVA+UVB protection) and wide brim hats (4-inch brim around the entire circumference of the hat) are also recommended for sun protection.  ?- Call for new or changing lesions. ? ?History of Basal Cell Carcinoma of the Skin ?- No evidence of recurrence today ?- Recommend regular full body skin exams ?- Recommend daily broad spectrum sunscreen SPF 30+ to sun-exposed areas, reapply every 2 hours as needed.  ?- Call if any new or changing lesions are noted between office visits ? ?History of Dysplastic Nevi ?- No evidence of recurrence today ?- Recommend regular full body skin exams ?- Recommend daily broad spectrum sunscreen SPF 30+ to sun-exposed areas, reapply every 2 hours as needed.  ?- Call if any new or changing lesions are noted between office visits ? ?Varicose Veins/Spider Veins ?- Dilated blue, purple or red veins at the lower extremities ?- Reassured ?- Smaller vessels can be treated by sclerotherapy (a procedure to inject a medicine into the veins to make them disappear) if desired, but the treatment is not covered by insurance. Larger vessels may be covered if symptomatic and we would refer to vascular surgeon if treatment desired. ? ?Skin cancer screening performed today. ? ?Return in about 1 year (around 04/28/2022) for TBSE. ? ?I, Rudell Cobb, CMA, am acting as scribe for Sarina Ser, MD . ?Documentation: I have reviewed the above documentation for accuracy and completeness, and I agree with the above. ? ?Sarina Ser, MD ? ?

## 2021-04-27 NOTE — Patient Instructions (Signed)

## 2021-05-02 ENCOUNTER — Encounter: Payer: Self-pay | Admitting: Dermatology

## 2021-05-11 ENCOUNTER — Ambulatory Visit
Admission: RE | Admit: 2021-05-11 | Discharge: 2021-05-11 | Disposition: A | Discharge status: Home / Self Care | Payer: Managed Care, Other (non HMO) | Source: Ambulatory Visit | Attending: Family Medicine | Admitting: Family Medicine

## 2021-05-11 ENCOUNTER — Other Ambulatory Visit: Payer: Self-pay

## 2021-05-11 DIAGNOSIS — Z1231 Encounter for screening mammogram for malignant neoplasm of breast: Secondary | ICD-10-CM | POA: Diagnosis present

## 2021-06-04 ENCOUNTER — Ambulatory Visit (INDEPENDENT_AMBULATORY_CARE_PROVIDER_SITE_OTHER): Payer: Managed Care, Other (non HMO) | Admitting: Obstetrics and Gynecology

## 2021-06-04 ENCOUNTER — Encounter: Payer: Self-pay | Admitting: Obstetrics and Gynecology

## 2021-06-04 ENCOUNTER — Other Ambulatory Visit (HOSPITAL_COMMUNITY)
Admission: RE | Admit: 2021-06-04 | Discharge: 2021-06-04 | Disposition: A | Discharge status: Home / Self Care | Payer: Managed Care, Other (non HMO) | Source: Ambulatory Visit | Attending: Obstetrics and Gynecology | Admitting: Obstetrics and Gynecology

## 2021-06-04 VITALS — BP 132/82 | Ht 68.0 in | Wt 183.0 lb

## 2021-06-04 DIAGNOSIS — Z01419 Encounter for gynecological examination (general) (routine) without abnormal findings: Secondary | ICD-10-CM

## 2021-06-04 DIAGNOSIS — Z124 Encounter for screening for malignant neoplasm of cervix: Secondary | ICD-10-CM

## 2021-06-04 DIAGNOSIS — Z1239 Encounter for other screening for malignant neoplasm of breast: Secondary | ICD-10-CM

## 2021-06-04 NOTE — Progress Notes (Signed)
? ? ?Gynecology Annual Exam  ?PCP: [name]., MD ? ?Chief Complaint:  ?Chief Complaint  ?Patient presents with  ? Annual Exam  ? ? ?History of Present Illness: Patient is a 62 y.o. G0P0000 presents for annual exam. The patient has no complaints today.  ? ?LMP: No LMP recorded. Patient is postmenopausal. ?She denies postmenopausal bleeding or spotting ? ?The patient is sexually active. She denies dyspareunia.  Postcoital Bleeding: no ? ? The patient does perform self breast exams.  There is notable family history of breast or ovarian cancer in her family. ? ?The patient has regular exercise: walking/ stretching ? ?Review of Systems: Review of Systems  ?Constitutional:  Negative for chills, fever, malaise/fatigue and weight loss.  ?HENT:  Negative for congestion, hearing loss and sinus pain.   ?Eyes:  Negative for blurred vision and double vision.  ?Respiratory:  Negative for cough, sputum production, shortness of breath and wheezing.   ?Cardiovascular:  Negative for chest pain, palpitations, orthopnea and leg swelling.  ?Gastrointestinal:  Negative for abdominal pain, constipation, diarrhea, nausea and vomiting.  ?Genitourinary:  Negative for dysuria, flank pain, frequency, hematuria and urgency.  ?Musculoskeletal:  Negative for back pain, falls and joint pain.  ?Skin:  Negative for itching and rash.  ?Neurological:  Negative for dizziness and headaches.  ?Psychiatric/Behavioral:  Negative for depression, substance abuse and suicidal ideas. The patient is not nervous/anxious.   ? ?Past Medical History:  ?Past Medical History:  ?Diagnosis Date  ? Arthritis   ? Basal cell carcinoma 05/04/2001  ? Basal cell left thigh  ? Hx of basal cell carcinoma 05/04/2001  ? L sup. ant. thigh  ? Hx of dysplastic nevus 02/23/2009  ? Left buttocks, moderate  ? Hx of dysplastic nevus 11/23/2009  ? Right posterior superior thigh  ? Palpitations   ? Vitamin D deficiency   ? ? ?Past Surgical History:  ?Past Surgical History:   ?Procedure Laterality Date  ? ANTERIOR CRUCIATE LIGAMENT REPAIR    ? ARTHROSCOPIC REPAIR ACL    ? left  ? BACK SURGERY    ? BIOPSY BREAST    ? left   ? BREAST BIOPSY Left 2010  ? benign-core  ? LAPAROSCOPY    ? MYOMECTOMY    ? PAROTIDECTOMY    ? THYMECTOMY    ? TONSILLECTOMY    ? TOTAL HIP ARTHROPLASTY Right 01/10/2018  ? Procedure: TOTAL HIP ARTHROPLASTY ANTERIOR APPROACH;  Surgeon: Lovell Sheehan, MD;  Location: [facility] ORS;  Service: Orthopedics;  Laterality: Right;  ? ? ?Gynecologic History:  ?No LMP recorded. Patient is postmenopausal. ?Last Pap: Results were: 2020 NIL   ?Last mammogram: 2023 Results were: BI-RAD I ? ?Obstetric History: G0P0000 ? ?Family History:  ?Family History  ?Problem Relation Age of Onset  ? Hypertension Mother   ? Hyperlipidemia Mother   ? Heart disease Mother   ? Heart failure Mother   ? Arrhythmia Mother   ?     A-Fib  ? Arthritis Mother   ? Cancer Mother   ? Hypertension Brother   ? Hyperlipidemia Brother   ? Arthritis Father   ? Hypertension Father   ? Prostate cancer Father   ? Breast cancer Cousin   ? Cancer Maternal Aunt   ? Breast cancer Maternal Aunt   ? ? ?Social History:  ?Social History  ? ?Socioeconomic History  ? Marital status: Married  ?  Spouse name: Shanon Brow  ? Number of children: 0  ? Years of education: associates  ?  Highest education level: Not on file  ?Occupational History  ?  Employer: [employer]  ?Tobacco Use  ? Smoking status: Never  ? Smokeless tobacco: Never  ?Vaping Use  ? Vaping Use: Never used  ?Substance and Sexual Activity  ? Alcohol use: No  ? Drug use: No  ? Sexual activity: Not Currently  ?  Birth control/protection: Post-menopausal  ?Other Topics Concern  ? Not on file  ?Social History Narrative  ? Not on file  ? ?Social Determinants of Health  ? ?Financial Resource Strain: Not on file  ?Food Insecurity: Not on file  ?Transportation Needs: Not on file  ?Physical Activity: Not on file  ?Stress: Not on file  ?Social Connections: Not on file  ?Intimate Partner  Violence: Not on file  ? ? ?Allergies:  ?Allergies  ?Allergen Reactions  ? Aspirin Hives, Rash and Anaphylaxis  ? ? ?Medications: ?Prior to Admission medications   ?Medication Sig Start Date End Date Taking? Authorizing Provider  ?Cetirizine HCl 10 MG CAPS Take 10 mg by mouth daily.    Yes [provider]  ?Cholecalciferol (VITAMIN D3) 1000 units CAPS Take 1,000 Units by mouth daily.    Yes [provider]  ?CRANBERRY PO Take 1 capsule by mouth daily.   Yes [provider]  ?folic acid (FOLVITE) 1 MG tablet Take by mouth. 03/15/21 03/15/22 Yes [provider]  ?methotrexate (RHEUMATREX) 2.5 MG tablet Take by mouth. 04/04/21 06/14/21 Yes [provider]  ?metoprolol succinate (TOPROL-XL) 50 MG 24 hr tablet Take 1 tablet (50 mg total) by mouth daily. 01/27/21  Yes Gollan, Kathlene November, MD  ?Multiple Vitamins-Minerals (ONE-A-DAY WOMENS 50 PLUS PO) Take 1 tablet by mouth daily.   Yes [provider]  ?OSPHENA 60 MG TABS TAKE 1 TABLET BY MOUTH DAILY 04/30/20  Yes Itzell Bendavid R, MD  ?tolterodine (DETROL LA) 4 MG 24 hr capsule TAKE 1 CAPSULE BY MOUTH  DAILY 08/31/20  Yes [name]., MD  ? ? ?Physical Exam ?Vitals: Blood pressure 132/82, height '5\' 8"'$  (1.727 m), weight 183 lb (83 kg). ? ?Physical Exam ?Constitutional:   ?   Appearance: She is well-developed.  ?Genitourinary:  ?   Genitourinary Comments: External: Normal appearing vulva. No lesions noted.  ?Speculum examination: Normal appearing cervix. No blood in the vaginal vault. No discharge.   ?Bimanual examination: Uterus midline, non-tender, normal in size, shape and contour.  No CMT. No adnexal masses. No adnexal tenderness. Pelvis not fixed. ? ?Breast Exam: breast equal without skin changes, nipple discharge, breast lump or enlarged lymph nodes ?  ?HENT:  ?   Head: Normocephalic and atraumatic.  ?Neck:  ?   Thyroid: No thyromegaly.  ?Cardiovascular:  ?   Rate and Rhythm: Normal rate and regular rhythm.   ?   Heart sounds: Normal heart sounds.  ?Pulmonary:  ?   Effort: Pulmonary effort is normal.  ?   Breath sounds: Normal breath sounds.  ?Abdominal:  ?   General: Bowel sounds are normal. There is no distension.  ?   Palpations: Abdomen is soft. There is no mass.  ?Musculoskeletal:  ?   Cervical back: Neck supple.  ?Neurological:  ?   Mental Status: She is alert and oriented to person, place, and time.  ?Skin: ?   General: Skin is warm and dry.  ?Psychiatric:     ?   Behavior: Behavior normal.     ?   Thought Content: Thought content normal.     ?  Judgment: Judgment normal.  ?Vitals reviewed.  ? ? ? ?Female chaperone present for pelvic and breast  portions of the physical exam ? ?Assessment: 61 y.o. G0P0000 routine annual exam ? ?Plan: ?Problem List Items Addressed This Visit   ?None ?Visit Diagnoses   ? ? Cervical cancer screening    -  Primary  ? Relevant Orders  ? Cytology - PAP  ? Encounter for annual routine gynecological examination      ? Encounter for gynecological examination without abnormal finding      ? Encounter for screening breast examination      ? ?  ? ? ?1) Mammogram - recommend yearly screening mammogram.  Mammogram  is up to date ? ?2) STI screening was offered and declined ? ?3) Pap smear today ? ?4) Colonoscopy -- up to date per patient ? ?5) Routine healthcare maintenance including cholesterol, diabetes screening discussed managed by PCP ? ?6) Osteoporosis screening - start at 9 ? ?Adrian Prows MD, FACOG ?[facility] ?06/04/2021 ?4:27 PM ? ? ? ? ? ? ? ?

## 2021-06-08 LAB — CYTOLOGY - PAP
Comment: NEGATIVE
Diagnosis: NEGATIVE
High risk HPV: NEGATIVE

## 2021-07-25 ENCOUNTER — Other Ambulatory Visit: Payer: Self-pay | Admitting: Family Medicine

## 2021-10-04 ENCOUNTER — Ambulatory Visit (INDEPENDENT_AMBULATORY_CARE_PROVIDER_SITE_OTHER): Payer: 59 | Admitting: Family Medicine

## 2021-10-04 VITALS — BP 138/78 | HR 113 | Wt 176.0 lb

## 2021-10-04 DIAGNOSIS — M793 Panniculitis, unspecified: Secondary | ICD-10-CM

## 2021-10-04 DIAGNOSIS — Z79899 Other long term (current) drug therapy: Secondary | ICD-10-CM | POA: Diagnosis not present

## 2021-10-04 DIAGNOSIS — G4730 Sleep apnea, unspecified: Secondary | ICD-10-CM

## 2021-10-04 DIAGNOSIS — I10 Essential (primary) hypertension: Secondary | ICD-10-CM | POA: Diagnosis not present

## 2021-10-04 DIAGNOSIS — I8312 Varicose veins of left lower extremity with inflammation: Secondary | ICD-10-CM | POA: Diagnosis not present

## 2021-10-04 DIAGNOSIS — E7800 Pure hypercholesterolemia, unspecified: Secondary | ICD-10-CM | POA: Diagnosis not present

## 2021-10-04 NOTE — Progress Notes (Unsigned)
Established patient visit   Patient: Hannah Gonzalez   DOB: [birth year]   61 y.o. Female  MRN: [id number] Visit Date: 10/04/2021  Today's healthcare provider: Megan Mans, MD   No chief complaint on file.  Subjective    HPI  Patient comes in today for follow-up.  She is emotionally fatigued after retiring a couple of years ago.  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 On review of systems she does snore and admits it is possible she could have sleep apnea She has been on methotrexate for her leg which is hurting less but needs lab work for follow-up on the methotrexate which is for lipo dermatosclerosis  Hypertension, follow-up  BP Readings from Last 3 Encounters:  10/04/21 138/78  06/04/21 132/82  04/05/21 128/82   Wt Readings from Last 3 Encounters:  10/04/21 176 lb (79.8 kg)  06/04/21 183 lb (83 kg)  04/05/21 187 lb (84.8 kg)     She was last seen for hypertension 6 months ago.  BP at that visit was as above. Management since that visit includes none.  Outside blood pressures are not being checked. Symptoms: No chest pain No chest pressure  No palpitations No syncope  No dyspnea No orthopnea  No paroxysmal nocturnal dyspnea No lower extremity edema   Pertinent labs Lab Results  Component Value Date   CHOL 260 (H) 04/06/2021   HDL 42 04/06/2021   LDLCALC 181 (H) 04/06/2021   TRIG 195 (H) 04/06/2021   CHOLHDL 6.2 (H) 04/06/2021   Lab Results  Component Value Date   NA 143 04/06/2021   K 4.5 04/06/2021   CREATININE 1.02 (H) 04/06/2021   EGFR 63 04/06/2021   GLUCOSE 98 04/06/2021   TSH 3.920 04/06/2021     The 10-year ASCVD risk score (Arnett DK, et al., 2019) is: 8.7%  ---------------------------------------------------------------------------------------------------   Medications: Outpatient Medications Prior to Visit  Medication Sig   Cetirizine HCl 10 MG CAPS Take 10 mg by mouth daily.     Cholecalciferol (VITAMIN D3) 1000 units CAPS Take 1,000 Units by mouth daily.    CRANBERRY PO Take 1 capsule by mouth daily.   folic acid (FOLVITE) 1 MG tablet Take by mouth.   methotrexate (RHEUMATREX) 2.5 MG tablet Take 10 mg by mouth once a week. Caution:Chemotherapy. Protect from light.   metoprolol succinate (TOPROL-XL) 50 MG 24 hr tablet Take 1 tablet (50 mg total) by mouth daily.   Multiple Vitamins-Minerals (ONE-A-DAY WOMENS 50 PLUS PO) Take 1 tablet by mouth daily.   tolterodine (DETROL LA) 4 MG 24 hr capsule TAKE 1 CAPSULE BY MOUTH  DAILY   OSPHENA 60 MG TABS TAKE 1 TABLET BY MOUTH DAILY   No facility-administered medications prior to visit.    Review of Systems      Objective    BP 138/78 (BP Location: Right Arm, Patient Position: Sitting, Cuff Size: Normal)   Pulse (!) 113   Wt 176 lb (79.8 kg)   SpO2 93%   BMI 26.76 kg/m  Vitals:   10/04/21 1102 10/04/21 1106  BP: 136/84 138/78  Pulse: (!) 113   SpO2: 93%   Weight: 176 lb (79.8 kg)      Physical Exam Vitals reviewed.       No results found for any visits on 10/04/21.  Assessment & Plan     1. Essential hypertension Good control - Lipid panel - CBC w/Diff/Platelet - Comprehensive  Metabolic Panel (CMET) - TSH  2. Hypercholesteremia  - Lipid panel - CBC w/Diff/Platelet - Comprehensive Metabolic Panel (CMET) - TSH  3. High risk medication use  - Lipid panel - CBC w/Diff/Platelet - Comprehensive Metabolic Panel (CMET) - TSH  4. Sleep apnea, unspecified type I think the patient's fatigue is multifactorial and partially emotional.  She denies frank depression. Consider counseling for her situation being the main caregiver for her mother. - Home sleep test    No follow-ups on file.      I, [name], MD, have reviewed all documentation for this visit. The documentation on 10/05/21 for the exam, diagnosis, procedures, and orders are all accurate and complete.    Kristl Morioka Cranford Mon, MD  [facility] [phone] (phone) [phone] (fax)  [place]

## 2021-10-06 ENCOUNTER — Ambulatory Visit: Payer: 59 | Admitting: Dermatology

## 2021-10-06 DIAGNOSIS — L570 Actinic keratosis: Secondary | ICD-10-CM | POA: Diagnosis not present

## 2021-10-06 DIAGNOSIS — I8312 Varicose veins of left lower extremity with inflammation: Secondary | ICD-10-CM | POA: Diagnosis not present

## 2021-10-06 DIAGNOSIS — L578 Other skin changes due to chronic exposure to nonionizing radiation: Secondary | ICD-10-CM

## 2021-10-06 MED ORDER — METHOTREXATE 2.5 MG PO TABS: 10.0000 mg | ORAL_TABLET | ORAL | 3 refills | Status: DC

## 2021-10-06 NOTE — Progress Notes (Signed)
Follow-Up Visit   Subjective  Hannah Gonzalez is a 61 y.o. female who presents for the following: Other (Lipodermatosclerosis of left lower leg - we referred her to [facility] and she has been seeing them but has had an insurance change and is no longer able to see them. They started her on MTX 03/2021 and was on 5 mg qweek for about 6 weeks then they increased her dose to 10 mg qweek with Folic acid 6 days per week. She no longer has any pain or sensitivity and it does not seem to be spreading. It is still discolored. PCP has ordered labs.Hannah Gonzalez also has a spot on her left cheek that she would like checked.). She would like to continue following here at [facility] for her Lipodermatosclerosis as she will no longer be covered with insurance at [facility].  The following portions of the chart were reviewed this encounter and updated as appropriate:   Tobacco  Allergies  Meds  Problems  Med Hx  Surg Hx  Fam Hx     Review of Systems:  No other skin or systemic complaints except as noted in HPI or Assessment and Plan.  Objective  Well appearing patient in no apparent distress; mood and affect are within normal limits.  A focused examination was performed including face, left lower leg. Relevant physical exam findings are noted in the Assessment and Plan.  Left Lower Leg Less induration of ant plaques but post plaque is still indurated - not tender to palpation.  Left lower leg above medial ankle sup Discoloration - 3.5 cm, Induration - 2.0 cm   Left lower leg above medial ankle inf Discoloration - 6.5 x 5.0 cm, Induration - 6.0 x 4.5 cm Left post calf Discoloration - 5.0 x 2.5 cm, Induration - 4.0 x 1.5 cm       Left Anterior Mandible Erythematous thin papules/macules with gritty scale.    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 She wants to follow here at [facility] due to insurance coverage at [facility]. We will continue MTX / Folic Acid treatment for now.  Labs to be done within a few days. Chronic and persistent condition with duration or expected duration over one year. Condition is symptomatic / bothersome to patient. Not to goal.  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 LLE, Improving on Methotrexate but not at goal Biopsy c/w venous stasis change, lipodermatosclerosis. Pt started on methotrexate 03/2021 with some improvement. Previously worked up for LPDS which was negative. Vascular workup was negative, including ultrasound. Is mildly responsive to prednisone. Now off of danazo. - Continue methotrexate '10mg'$  weekly and folic acid '3mg'$  on non-MTX days - Continue with compression stockings and consider triamcinolone ointment daily to left lower leg - she is changing insurance and will not be able to follow with [place]. Okay for PCP or outside derm to continue with refills and lab monitoring  High risk medication use (Methotrexate) - labs WNL 05/2021 - will need repeat CBC w/ diff, AST, ALT, Bun, Cr every 3 months  The patient was advised to call for an appointment should any new, changing, or symptomatic lesions develop.   RTC: Return if symptoms worsen or fail to improve. or sooner as needed   AK (actinic keratosis) Left Anterior Mandible  Destruction of lesion -  Left Anterior Mandible Complexity: simple   Destruction method: cryotherapy   Informed consent: discussed and consent obtained   Timeout:  patient name, date of birth, surgical site, and procedure verified Lesion destroyed using liquid nitrogen: Yes   Region frozen until ice ball extended beyond lesion: Yes   Outcome: patient tolerated procedure well with no complications   Post-procedure details: wound care instructions given    Actinic Damage - chronic, secondary to cumulative UV radiation  exposure/sun exposure over time - diffuse scaly erythematous macules with underlying dyspigmentation - Recommend daily broad spectrum sunscreen SPF 30+ to sun-exposed areas, reapply every 2 hours as needed.  - Recommend staying in the shade or wearing long sleeves, sun glasses (UVA+UVB protection) and wide brim hats (4-inch brim around the entire circumference of the hat). - Call for new or changing lesions.  Return in about 3 months (around 01/06/2022).  I, Ashok Cordia, CMA, am acting as scribe for Sarina Ser, MD . Documentation: I have reviewed the above documentation for accuracy and completeness, and I agree with the above.  Sarina Ser, MD

## 2021-10-06 NOTE — Patient Instructions (Signed)
Due to recent changes in healthcare laws, you may see results of your pathology and/or laboratory studies on MyChart before the doctors have had a chance to review them. We understand that in some cases there may be results that are confusing or concerning to you. Please understand that not all results are received at the same time and often the doctors may need to interpret multiple results in order to provide you with the best plan of care or course of treatment. Therefore, we ask that you please give us 2 business days to thoroughly review all your results before contacting the office for clarification. Should we see a critical lab result, you will be contacted sooner.   If You Need Anything After Your Visit  If you have any questions or concerns for your doctor, please call our main line at [phone] and press option 4 to reach your doctor's medical assistant. If no one answers, please leave a voicemail as directed and we will return your call as soon as possible. Messages left after 4 pm will be answered the following business day.   You may also send us a message via MyChart. We typically respond to MyChart messages within 1-2 business days.  For prescription refills, please ask your pharmacy to contact our office. Our fax number is [phone].  If you have an urgent issue when the clinic is closed that cannot wait until the next business day, you can page your doctor at the number below.    Please note that while we do our best to be available for urgent issues outside of office hours, we are not available 24/7.   If you have an urgent issue and are unable to reach us, you may choose to seek medical care at your doctor's office, retail clinic, urgent care center, or emergency room.  If you have a medical emergency, please immediately call 911 or go to the emergency department.  Pager Numbers  - Dr. Kowalski: [phone]  - Dr. Moye: [phone]  - Dr. Stewart:  [phone]  In the event of inclement weather, please call our main line at [phone] for an update on the status of any delays or closures.  Dermatology Medication Tips: Please keep the boxes that topical medications come in in order to help keep track of the instructions about where and how to use these. Pharmacies typically print the medication instructions only on the boxes and not directly on the medication tubes.   If your medication is too expensive, please contact our office at [phone] option 4 or send us a message through MyChart.   We are unable to tell what your co-pay for medications will be in advance as this is different depending on your insurance coverage. However, we may be able to find a substitute medication at lower cost or fill out paperwork to get insurance to cover a needed medication.   If a prior authorization is required to get your medication covered by your insurance company, please allow us 1-2 business days to complete this process.  Drug prices often vary depending on where the prescription is filled and some pharmacies may offer cheaper prices.  The website www.goodrx.com contains coupons for medications through different pharmacies. The prices here do not account for what the cost may be with help from insurance (it may be cheaper with your insurance), but the website can give you the price if you did not use any insurance.  - You can print the associated coupon and take it with   your prescription to the pharmacy.  - You may also stop by our office during regular business hours and pick up a GoodRx coupon card.  - If you need your prescription sent electronically to a different pharmacy, notify our office through [place] MyChart or by phone at [phone] option 4.     Si Usted Necesita Algo Despus de Su Visita  Tambin puede enviarnos un mensaje a travs de MyChart. Por lo general respondemos a los mensajes de MyChart en el transcurso de 1 a 2  das hbiles.  Para renovar recetas, por favor pida a su farmacia que se ponga en contacto con nuestra oficina. Nuestro nmero de fax es el [phone].  Si tiene un asunto urgente cuando la clnica est cerrada y que no puede esperar hasta el siguiente da hbil, puede llamar/localizar a su doctor(a) al nmero que aparece a continuacin.   Por favor, tenga en cuenta que aunque hacemos todo lo posible para estar disponibles para asuntos urgentes fuera del horario de oficina, no estamos disponibles las 24 horas del da, los 7 das de la semana.   Si tiene un problema urgente y no puede comunicarse con nosotros, puede optar por buscar atencin mdica  en el consultorio de su doctor(a), en una clnica privada, en un centro de atencin urgente o en una sala de emergencias.  Si tiene una emergencia mdica, por favor llame inmediatamente al 911 o vaya a la sala de emergencias.  Nmeros de bper  - Dr. Kowalski: [phone]  - Dra. Moye: [phone]  - Dra. Stewart: [phone]  En caso de inclemencias del tiempo, por favor llame a nuestra lnea principal al [phone] para una actualizacin sobre el estado de cualquier retraso o cierre.  Consejos para la medicacin en dermatologa: Por favor, guarde las cajas en las que vienen los medicamentos de uso tpico para ayudarle a seguir las instrucciones sobre dnde y cmo usarlos. Las farmacias generalmente imprimen las instrucciones del medicamento slo en las cajas y no directamente en los tubos del medicamento.   Si su medicamento es muy caro, por favor, pngase en contacto con nuestra oficina llamando al [phone] y presione la opcin 4 o envenos un mensaje a travs de MyChart.   No podemos decirle cul ser su copago por los medicamentos por adelantado ya que esto es diferente dependiendo de la cobertura de su seguro. Sin embargo, es posible que podamos encontrar un medicamento sustituto a menor costo o llenar un formulario para que el  seguro cubra el medicamento que se considera necesario.   Si se requiere una autorizacin previa para que su compaa de seguros cubra su medicamento, por favor permtanos de 1 a 2 das hbiles para completar este proceso.  Los precios de los medicamentos varan con frecuencia dependiendo del lugar de dnde se surte la receta y alguna farmacias pueden ofrecer precios ms baratos.  El sitio web www.goodrx.com tiene cupones para medicamentos de diferentes farmacias. Los precios aqu no tienen en cuenta lo que podra costar con la ayuda del seguro (puede ser ms barato con su seguro), pero el sitio web puede darle el precio si no utiliz ningn seguro.  - Puede imprimir el cupn correspondiente y llevarlo con su receta a la farmacia.  - Tambin puede pasar por nuestra oficina durante el horario de atencin regular y recoger una tarjeta de cupones de GoodRx.  - Si necesita que su receta se enve electrnicamente a una farmacia diferente, informe a nuestra oficina a travs de MyChart de [place]   o por telfono llamando al [phone] y presione la opcin 4.  

## 2021-10-07 DIAGNOSIS — I10 Essential (primary) hypertension: Secondary | ICD-10-CM | POA: Diagnosis not present

## 2021-10-07 DIAGNOSIS — E7800 Pure hypercholesterolemia, unspecified: Secondary | ICD-10-CM | POA: Diagnosis not present

## 2021-10-07 DIAGNOSIS — Z79899 Other long term (current) drug therapy: Secondary | ICD-10-CM | POA: Diagnosis not present

## 2021-10-08 ENCOUNTER — Encounter: Payer: Self-pay | Admitting: Dermatology

## 2021-10-08 LAB — CBC WITH DIFFERENTIAL/PLATELET
Basophils Absolute: 0 10*3/uL (ref 0.0–0.2)
Basos: 1 %
EOS (ABSOLUTE): 0.1 10*3/uL (ref 0.0–0.4)
Eos: 2 %
Hematocrit: 39.5 % (ref 34.0–46.6)
Hemoglobin: 13.7 g/dL (ref 11.1–15.9)
Immature Grans (Abs): 0 10*3/uL (ref 0.0–0.1)
Immature Granulocytes: 0 %
Lymphocytes Absolute: 2.8 10*3/uL (ref 0.7–3.1)
Lymphs: 44 %
MCH: 31.1 pg (ref 26.6–33.0)
MCHC: 34.7 g/dL (ref 31.5–35.7)
MCV: 90 fL (ref 79–97)
Monocytes Absolute: 0.4 10*3/uL (ref 0.1–0.9)
Monocytes: 7 %
Neutrophils Absolute: 2.9 10*3/uL (ref 1.4–7.0)
Neutrophils: 46 %
Platelets: 246 10*3/uL (ref 150–450)
RBC: 4.4 x10E6/uL (ref 3.77–5.28)
RDW: 12.6 % (ref 11.7–15.4)
WBC: 6.3 10*3/uL (ref 3.4–10.8)

## 2021-10-08 LAB — COMPREHENSIVE METABOLIC PANEL
ALT: 21 IU/L (ref 0–32)
AST: 21 IU/L (ref 0–40)
Albumin/Globulin Ratio: 2.8 — ABNORMAL HIGH (ref 1.2–2.2)
Albumin: 4.5 g/dL (ref 3.9–4.9)
Alkaline Phosphatase: 64 IU/L (ref 44–121)
BUN/Creatinine Ratio: 16 (ref 12–28)
BUN: 14 mg/dL (ref 8–27)
Bilirubin Total: 0.4 mg/dL (ref 0.0–1.2)
CO2: 22 mmol/L (ref 20–29)
Calcium: 9.3 mg/dL (ref 8.7–10.3)
Chloride: 103 mmol/L (ref 96–106)
Creatinine, Ser: 0.87 mg/dL (ref 0.57–1.00)
Globulin, Total: 1.6 g/dL (ref 1.5–4.5)
Glucose: 90 mg/dL (ref 70–99)
Potassium: 4.3 mmol/L (ref 3.5–5.2)
Sodium: 142 mmol/L (ref 134–144)
Total Protein: 6.1 g/dL (ref 6.0–8.5)
eGFR: 75 mL/min/{1.73_m2} (ref >59)

## 2021-10-08 LAB — LIPID PANEL
Chol/HDL Ratio: 5.7 ratio — ABNORMAL HIGH (ref 0.0–4.4)
Cholesterol, Total: 240 mg/dL — ABNORMAL HIGH (ref 100–199)
HDL: 42 mg/dL (ref >39)
LDL Chol Calc (NIH): 160 mg/dL — ABNORMAL HIGH (ref 0–99)
Triglycerides: 207 mg/dL — ABNORMAL HIGH (ref 0–149)
VLDL Cholesterol Cal: 38 mg/dL (ref 5–40)

## 2021-10-08 LAB — TSH: TSH: 3.2 u[IU]/mL (ref 0.450–4.500)

## 2021-10-11 NOTE — Telephone Encounter (Signed)
Patient informed of lab review and to continue MTX and folic acid. MTX already refilled at visit. Awaiting verification from Dr. Nehemiah Massed on folic acid dosing. [facility].

## 2021-10-12 ENCOUNTER — Encounter: Payer: Self-pay | Admitting: Dermatology

## 2021-10-12 ENCOUNTER — Other Ambulatory Visit: Payer: Self-pay

## 2021-10-12 DIAGNOSIS — Z79899 Other long term (current) drug therapy: Secondary | ICD-10-CM

## 2021-10-12 MED ORDER — FOLIC ACID 1 MG PO TABS: ORAL_TABLET | ORAL | 1 refills | Status: DC

## 2021-10-25 ENCOUNTER — Encounter: Payer: Self-pay | Admitting: Family Medicine

## 2021-10-26 ENCOUNTER — Other Ambulatory Visit: Payer: Self-pay | Admitting: *Deleted

## 2021-10-26 DIAGNOSIS — I10 Essential (primary) hypertension: Secondary | ICD-10-CM

## 2021-10-26 DIAGNOSIS — R350 Frequency of micturition: Secondary | ICD-10-CM

## 2021-10-26 MED ORDER — TOLTERODINE TARTRATE ER 4 MG PO CP24: 4.0000 mg | ORAL_CAPSULE | Freq: Every day | ORAL | 3 refills | Status: DC

## 2021-10-26 MED ORDER — METOPROLOL SUCCINATE ER 50 MG PO TB24: 50.0000 mg | ORAL_TABLET | Freq: Every day | ORAL | 3 refills | Status: DC

## 2021-11-01 ENCOUNTER — Telehealth: Payer: Self-pay

## 2021-11-01 NOTE — Telephone Encounter (Signed)
Copied from [place] [id number]. Topic: General - Other >> Nov 01, 2021  1:12 PM Oley Balm E wrote: Reason for CRM: Patty calling from [employer] is requesting a call back regarding a recent med req. They received. Please advise   Best contact: [phone]

## 2021-11-02 NOTE — Telephone Encounter (Signed)
LMOVM for [facility] to return call.

## 2021-11-11 NOTE — Telephone Encounter (Signed)
Per [facility], CPAP equipment is being shipped to patient.

## 2021-11-11 NOTE — Telephone Encounter (Signed)
Returned call

## 2021-11-15 NOTE — Progress Notes (Unsigned)
Cardiology Office Note  Date:  11/16/2021   ID:  Hannah Gonzalez, DOB [birth year], MRN [id number]  PCP:  Hannah Banana., MD   Chief Complaint  Patient presents with   1 year follow up     Patient c/o tachycardia more than usual. Medications reviewed by the patient verbally.     HPI:  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   More stress with mom, age>90 Care is 24/7 Shares responsibilities with her brother  With extra medical and emotional stressors, has appreciated elevated heart rate Last several doctor visits heart rate 100 or higher Has been taking metoprolol succinate 50 in the morning  No regular time off for relaxing, no regular exercise program  completed back surgery, hip replacement surgery  Lab work reviewed Total cholesterol 240, LDL 160 In the past, referred not to be on medications  Prior CT scan chest images pulled up, no significant coronary calcification noted or aortic atherosclerosis  Did lifescreening, "All normal"  EKG personally reviewed by myself on todays visit Shows normal sinus rhythm with rate 100 bpm no significant ST or T wave changes  Other past medical history reviewed 30 day monitor reviewed with her showing normal sinus rhythm, no significant arrhythmia, average heart rate 80-90 bpm  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 Faith Gonzalez    mother has history of A. fib, father with pacemaker, died of a stroke Both parents have very high cholesterol    PMH:   has a past  medical history of Arthritis, Basal cell carcinoma (05/04/2001), basal cell carcinoma (05/04/2001), dysplastic nevus (02/23/2009), dysplastic nevus (11/23/2009), Palpitations, and Vitamin D deficiency.  PSH:    Past Surgical History:  Procedure Laterality Date   ANTERIOR CRUCIATE LIGAMENT REPAIR     ARTHROSCOPIC REPAIR ACL     left   BACK SURGERY     BIOPSY BREAST     left    BREAST BIOPSY Left 2010   benign-core   LAPAROSCOPY     MYOMECTOMY     PAROTIDECTOMY     THYMECTOMY     TONSILLECTOMY     TOTAL HIP ARTHROPLASTY Right 01/10/2018   Procedure: TOTAL HIP ARTHROPLASTY ANTERIOR APPROACH;  Surgeon: Hannah Sheehan, MD;  Location: [facility] ORS;  Service: Orthopedics;  Laterality: Right;    Current Outpatient Medications  Medication Sig Dispense Refill   Cetirizine HCl 10 MG CAPS Take 10 mg by mouth daily.      Cholecalciferol (VITAMIN D3) 1000 units CAPS Take 1,000 Units by mouth daily.      CRANBERRY PO Take 1 capsule by mouth daily.     folic acid (FOLVITE) 1 MG tablet Take 1 tablet (1 mg total) by mouth Three (3) times a day. Take on non-methotrexate days 270 tablet 1   methotrexate (RHEUMATREX) 2.5 MG tablet Take 4 tablets (10 mg total) by mouth once a week. Caution:Chemotherapy. Protect from light. 4 tablet 3   metoprolol succinate (TOPROL-XL) 50 MG 24 hr tablet Take 1 tablet (50 mg total) by mouth daily. 90 tablet 3   Multiple Vitamins-Minerals (ONE-A-DAY WOMENS 50  PLUS PO) Take 1 tablet by mouth daily.     tolterodine (DETROL LA) 4 MG 24 hr capsule Take 1 capsule (4 mg total) by mouth daily. 90 capsule 3   No current facility-administered medications for this visit.    Allergies:   Aspirin   Social History:  The patient  reports that she has never smoked. She has never used smokeless tobacco. She reports that she does not drink alcohol and does not use drugs.   Family History:   family history includes Arrhythmia in her mother; Arthritis in her father and mother; Breast  cancer in her cousin and maternal aunt; Cancer in her maternal aunt and mother; Heart disease in her mother; Heart failure in her mother; Hyperlipidemia in her brother and mother; Hypertension in her brother, father, and mother; Prostate cancer in her father.    Review of Systems: Review of Systems  Constitutional: Negative.   HENT: Negative.    Respiratory: Negative.    Cardiovascular: Negative.   Gastrointestinal: Negative.   Musculoskeletal: Negative.   Neurological: Negative.   Psychiatric/Behavioral: Negative.    All other systems reviewed and are negative.   PHYSICAL EXAM: VS:  BP (!) 128/90 (BP Location: Left Arm, Patient Position: Sitting, Cuff Size: Normal)   Pulse 100   Ht '5\' 8"'$  (1.727 m)   Wt 176 lb 8 oz (80.1 kg)   SpO2 98%   BMI 26.84 kg/m  , BMI Body mass index is 26.84 kg/m. Constitutional:  oriented to person, place, and time. No distress.  HENT:  Head: Grossly normal Eyes:  no discharge. No scleral icterus.  Neck: No JVD, no carotid bruits  Cardiovascular: Regular rate and rhythm, no murmurs appreciated Pulmonary/Chest: Clear to auscultation bilaterally, no wheezes or rails Abdominal: Soft.  no distension.  no tenderness.  Musculoskeletal: Normal range of motion Neurological:  normal muscle tone. Coordination normal. No atrophy Skin: Skin warm and dry Psychiatric: normal affect, pleasant  Recent Labs: 10/07/2021: ALT 21; BUN 14; Creatinine, Ser 0.87; Hemoglobin 13.7; Platelets 246; Potassium 4.3; Sodium 142; TSH 3.200    Lipid Panel Lab Results  Component Value Date   CHOL 240 (H) 10/07/2021   HDL 42 10/07/2021   LDLCALC 160 (H) 10/07/2021   TRIG 207 (H) 10/07/2021      Wt Readings from Last 3 Encounters:  11/16/21 176 lb 8 oz (80.1 kg)  10/04/21 176 lb (79.8 kg)  06/04/21 183 lb (83 kg)     ASSESSMENT AND PLAN:  Tachycardia - Plan: EKG 12-Lead Heart rate continues to run high, likely exacerbated by emotional stressors/taking care of  62 year old mother and new medical issues Recommend she increase metoprolol succinate up to 75 mg daily, would likely need to titrate up to 100 daily  Mixed hyperlipidemia  No significant coronary calcification on prior CT scans No aortic atherosclerosis on imaging Long history of high cholesterol Previously declined statin Could consider CT coronary calcium scoring in follow-up  Essential hypertension -  Increasing metoprolol as above  Stress/adjustment disorder Retired from [employer] 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    Signed, Esmond Plants, M.D., Ph.D. 11/16/2021  [place], [place]

## 2021-11-16 ENCOUNTER — Encounter: Payer: Self-pay | Admitting: Cardiovascular Disease

## 2021-11-16 ENCOUNTER — Ambulatory Visit: Payer: 59 | Attending: Cardiovascular Disease | Admitting: Cardiovascular Disease

## 2021-11-16 VITALS — BP 128/90 | HR 100 | Ht 68.0 in | Wt 176.5 lb

## 2021-11-16 DIAGNOSIS — R000 Tachycardia, unspecified: Secondary | ICD-10-CM

## 2021-11-16 DIAGNOSIS — I10 Essential (primary) hypertension: Secondary | ICD-10-CM | POA: Diagnosis not present

## 2021-11-16 DIAGNOSIS — E782 Mixed hyperlipidemia: Secondary | ICD-10-CM

## 2021-11-16 NOTE — Patient Instructions (Addendum)
Medication Instructions:  Please increase the metoprolol up to 75 mg daily (1 1/2 pills) If heart rate remains elevated, then increase metoprolol succinate up to 100 mg daily (call for new script)  If you need a refill on your cardiac medications before your next appointment, please call your pharmacy.   Lab work: No new labs needed  Testing/Procedures: No new testing needed  Follow-Up: At [facility], you and your health needs are our priority.  As part of our continuing mission to provide you with exceptional heart care, we have created designated Provider Care Teams.  These Care Teams include your primary Cardiologist (physician) and Advanced Practice Providers (APPs -  Physician Assistants and Nurse Practitioners) who all work together to provide you with the care you need, when you need it.  You will need a follow up appointment in 6 months  Providers on your designated Care Team:   Murray Hodgkins, NP Christell Faith, PA-C Cadence Kathlen Mody, [place]  COVID-19 Vaccine Information can be found at: [email or web address].uk For questions related to vaccine distribution or appointments, please email [email or web address] or call [phone].

## 2021-11-25 DIAGNOSIS — H40003 Preglaucoma, unspecified, bilateral: Secondary | ICD-10-CM | POA: Diagnosis not present

## 2021-12-09 ENCOUNTER — Encounter: Payer: Self-pay | Admitting: Cardiovascular Disease

## 2021-12-10 ENCOUNTER — Other Ambulatory Visit: Payer: Self-pay | Admitting: *Deleted

## 2021-12-10 DIAGNOSIS — I10 Essential (primary) hypertension: Secondary | ICD-10-CM

## 2021-12-10 MED ORDER — METOPROLOL SUCCINATE ER 50 MG PO TB24: 75.0000 mg | ORAL_TABLET | Freq: Every day | ORAL | 3 refills | Status: DC

## 2021-12-20 ENCOUNTER — Encounter (INDEPENDENT_AMBULATORY_CARE_PROVIDER_SITE_OTHER): Payer: Self-pay

## 2021-12-29 NOTE — Progress Notes (Unsigned)
   I,Al Gagen S Elbert Spickler,acting as a Education administrator for Lavon Paganini, MD.,have documented all relevant documentation on the behalf of Lavon Paganini, MD,as directed by  Lavon Paganini, MD while in the presence of Lavon Paganini, MD.     Established patient visit   Patient: Hannah Gonzalez   DOB: 06-20-1960   61 y.o. Female  MRN: [id number] Visit Date: 12/30/2021  Today's healthcare provider: Lavon Paganini, MD   No chief complaint on file.  Subjective    HPI  Follow up for sleep study  The patient was last seen for this 3 months ago. Changes made at last visit include sleep study.  Patient had sleep study done 11/15/21.  -----------------------------------------------------------------------------------------   Medications: Outpatient Medications Prior to Visit  Medication Sig   Cetirizine HCl 10 MG CAPS Take 10 mg by mouth daily.    Cholecalciferol (VITAMIN D3) 1000 units CAPS Take 1,000 Units by mouth daily.    CRANBERRY PO Take 1 capsule by mouth daily.   folic acid (FOLVITE) 1 MG tablet Take 1 tablet (1 mg total) by mouth Three (3) times a day. Take on non-methotrexate days   methotrexate (RHEUMATREX) 2.5 MG tablet Take 4 tablets (10 mg total) by mouth once a week. Caution:Chemotherapy. Protect from light.   metoprolol succinate (TOPROL-XL) 50 MG 24 hr tablet Take 1.5 tablets (75 mg total) by mouth daily.   Multiple Vitamins-Minerals (ONE-A-DAY WOMENS 50 PLUS PO) Take 1 tablet by mouth daily.   tolterodine (DETROL LA) 4 MG 24 hr capsule Take 1 capsule (4 mg total) by mouth daily.   No facility-administered medications prior to visit.    Review of Systems  {Labs  Heme  Chem  Endocrine  Serology  Results Review (optional):23779}   Objective    There were no vitals taken for this visit. BP Readings from Last 3 Encounters:  11/16/21 (!) 128/90  10/04/21 138/78  06/04/21 132/82   Wt Readings from Last 3 Encounters:  11/16/21 176 lb 8 oz (80.1 kg)   10/04/21 176 lb (79.8 kg)  06/04/21 183 lb (83 kg)      Physical Exam  ***  No results found for any visits on 12/30/21.  Assessment & Plan     ***  No follow-ups on file.      {provider attestation***:1}   Lavon Paganini, MD  [facility] [phone] (phone) [phone] (fax)  [place]

## 2021-12-30 ENCOUNTER — Encounter: Payer: Self-pay | Admitting: Family Medicine

## 2021-12-30 ENCOUNTER — Ambulatory Visit: Payer: 59 | Admitting: Family Medicine

## 2021-12-30 VITALS — BP 119/80 | HR 81 | Temp 98.3°F | Resp 16 | Ht 68.0 in | Wt 179.7 lb

## 2021-12-30 DIAGNOSIS — R499 Unspecified voice and resonance disorder: Secondary | ICD-10-CM

## 2021-12-30 DIAGNOSIS — G4733 Obstructive sleep apnea (adult) (pediatric): Secondary | ICD-10-CM | POA: Diagnosis not present

## 2021-12-30 DIAGNOSIS — Z23 Encounter for immunization: Secondary | ICD-10-CM

## 2021-12-30 NOTE — Assessment & Plan Note (Signed)
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

## 2021-12-30 NOTE — Assessment & Plan Note (Signed)
New diagnosis Reviewed home sleep study results with the patient AHI 14.4 per hour - mild OSA Will send Rx for autoPAP with nasal pillows mask and all appropriate headgear, tubing, filters, etc At f/u in 41mwill check compliance

## 2022-01-04 ENCOUNTER — Ambulatory Visit: Payer: 59 | Admitting: Dermatology

## 2022-01-04 DIAGNOSIS — M793 Panniculitis, unspecified: Secondary | ICD-10-CM | POA: Diagnosis not present

## 2022-01-04 DIAGNOSIS — Z5111 Encounter for antineoplastic chemotherapy: Secondary | ICD-10-CM

## 2022-01-04 DIAGNOSIS — Z79899 Other long term (current) drug therapy: Secondary | ICD-10-CM

## 2022-01-04 NOTE — Patient Instructions (Signed)
Due to recent changes in healthcare laws, you may see results of your pathology and/or laboratory studies on MyChart before the doctors have had a chance to review them. We understand that in some cases there may be results that are confusing or concerning to you. Please understand that not all results are received at the same time and often the doctors may need to interpret multiple results in order to provide you with the best plan of care or course of treatment. Therefore, we ask that you please give us 2 business days to thoroughly review all your results before contacting the office for clarification. Should we see a critical lab result, you will be contacted sooner.   If You Need Anything After Your Visit  If you have any questions or concerns for your doctor, please call our main line at [phone] and press option 4 to reach your doctor's medical assistant. If no one answers, please leave a voicemail as directed and we will return your call as soon as possible. Messages left after 4 pm will be answered the following business day.   You may also send us a message via MyChart. We typically respond to MyChart messages within 1-2 business days.  For prescription refills, please ask your pharmacy to contact our office. Our fax number is [phone].  If you have an urgent issue when the clinic is closed that cannot wait until the next business day, you can page your doctor at the number below.    Please note that while we do our best to be available for urgent issues outside of office hours, we are not available 24/7.   If you have an urgent issue and are unable to reach us, you may choose to seek medical care at your doctor's office, retail clinic, urgent care center, or emergency room.  If you have a medical emergency, please immediately call 911 or go to the emergency department.  Pager Numbers  - Dr. Kowalski: [phone]  - Dr. Moye: [phone]  - Dr. Stewart:  [phone]  In the event of inclement weather, please call our main line at [phone] for an update on the status of any delays or closures.  Dermatology Medication Tips: Please keep the boxes that topical medications come in in order to help keep track of the instructions about where and how to use these. Pharmacies typically print the medication instructions only on the boxes and not directly on the medication tubes.   If your medication is too expensive, please contact our office at [phone] option 4 or send us a message through MyChart.   We are unable to tell what your co-pay for medications will be in advance as this is different depending on your insurance coverage. However, we may be able to find a substitute medication at lower cost or fill out paperwork to get insurance to cover a needed medication.   If a prior authorization is required to get your medication covered by your insurance company, please allow us 1-2 business days to complete this process.  Drug prices often vary depending on where the prescription is filled and some pharmacies may offer cheaper prices.  The website www.goodrx.com contains coupons for medications through different pharmacies. The prices here do not account for what the cost may be with help from insurance (it may be cheaper with your insurance), but the website can give you the price if you did not use any insurance.  - You can print the associated coupon and take it with   your prescription to the pharmacy.  - You may also stop by our office during regular business hours and pick up a GoodRx coupon card.  - If you need your prescription sent electronically to a different pharmacy, notify our office through [facility] MyChart or by phone at [phone] option 4.     Si Usted Necesita Algo Despus de Su Visita  Tambin puede enviarnos un mensaje a travs de MyChart. Por lo general respondemos a los mensajes de MyChart en el transcurso de 1 a 2  das hbiles.  Para renovar recetas, por favor pida a su farmacia que se ponga en contacto con nuestra oficina. Nuestro nmero de fax es el [phone].  Si tiene un asunto urgente cuando la clnica est cerrada y que no puede esperar hasta el siguiente da hbil, puede llamar/localizar a su doctor(a) al nmero que aparece a continuacin.   Por favor, tenga en cuenta que aunque hacemos todo lo posible para estar disponibles para asuntos urgentes fuera del horario de oficina, no estamos disponibles las 24 horas del da, los 7 das de la semana.   Si tiene un problema urgente y no puede comunicarse con nosotros, puede optar por buscar atencin mdica  en el consultorio de su doctor(a), en una clnica privada, en un centro de atencin urgente o en una sala de emergencias.  Si tiene una emergencia mdica, por favor llame inmediatamente al 911 o vaya a la sala de emergencias.  Nmeros de bper  - Dr. Kowalski: [phone]  - Dra. Moye: [phone]  - Dra. Stewart: [phone]  En caso de inclemencias del tiempo, por favor llame a nuestra lnea principal al [phone] para una actualizacin sobre el estado de cualquier retraso o cierre.  Consejos para la medicacin en dermatologa: Por favor, guarde las cajas en las que vienen los medicamentos de uso tpico para ayudarle a seguir las instrucciones sobre dnde y cmo usarlos. Las farmacias generalmente imprimen las instrucciones del medicamento slo en las cajas y no directamente en los tubos del medicamento.   Si su medicamento es muy caro, por favor, pngase en contacto con nuestra oficina llamando al [phone] y presione la opcin 4 o envenos un mensaje a travs de MyChart.   No podemos decirle cul ser su copago por los medicamentos por adelantado ya que esto es diferente dependiendo de la cobertura de su seguro. Sin embargo, es posible que podamos encontrar un medicamento sustituto a menor costo o llenar un formulario para que el  seguro cubra el medicamento que se considera necesario.   Si se requiere una autorizacin previa para que su compaa de seguros cubra su medicamento, por favor permtanos de 1 a 2 das hbiles para completar este proceso.  Los precios de los medicamentos varan con frecuencia dependiendo del lugar de dnde se surte la receta y alguna farmacias pueden ofrecer precios ms baratos.  El sitio web www.goodrx.com tiene cupones para medicamentos de diferentes farmacias. Los precios aqu no tienen en cuenta lo que podra costar con la ayuda del seguro (puede ser ms barato con su seguro), pero el sitio web puede darle el precio si no utiliz ningn seguro.  - Puede imprimir el cupn correspondiente y llevarlo con su receta a la farmacia.  - Tambin puede pasar por nuestra oficina durante el horario de atencin regular y recoger una tarjeta de cupones de GoodRx.  - Si necesita que su receta se enve electrnicamente a una farmacia diferente, informe a nuestra oficina a travs de MyChart de [place]   o por telfono llamando al [phone] y presione la opcin 4.  

## 2022-01-04 NOTE — Progress Notes (Signed)
   Follow-Up Visit   Subjective  Hannah Gonzalez is a 61 y.o. female who presents for the following: Follow-up.(Lipodermatosclerosis of left lower leg - we referred her to [facility] they started her on MTX 03/2021 and was on 5 mg qweek for about 6 weeks then they increased her dose to 10 mg qweek with Folic acid 6 days per week. She no longer has any pain or sensitivity and it does not seem to be spreading. It is still discolored.   The following portions of the chart were reviewed this encounter and updated as appropriate:   Tobacco  Allergies  Meds  Problems  Med Hx  Surg Hx  Fam Hx     Review of Systems:  No other skin or systemic complaints except as noted in HPI or Assessment and Plan.  Objective  Well appearing patient in no apparent distress; mood and affect are within normal limits.  A focused examination was performed including lower legs. Relevant physical exam findings are noted in the Assessment and Plan.  left lower leg Less induration of ant plaques but post plaque is still indurated - not tender to palpation.   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 Condition is symptomatic / bothersome to patient. Not to goal.  Pt feels her condition is improved, although she does have fatigue symptoms with MTX, but wants to continue for now. Symptoms of pain and sensitivity and erythema almost completely resolved. Induration and dyschromia persistent.  - Continue methotrexate '10mg'$  weekly and folic acid '3mg'$  on non-MTX days - Continue with compression stockings   High risk medication use  (Methotrexate) - labs WNL 05/2021 - will need repeat CBC w/ diff, AST, ALT, Bun, Cr every 3 months    Labs ordered CBC with diff, AST, ALT, BUN, Creatinine   Related Procedures CBC with Differential/Platelets AST ALT BUN Creatinine, Serum  Return in about 3 months (around 04/06/2022) for Lipodermatosclerosis .  IMarye Round, CMA, am acting as scribe for Sarina Ser, MD .  Documentation: I have reviewed the above documentation for accuracy and completeness, and I agree with the above.  Sarina Ser, MD

## 2022-01-05 ENCOUNTER — Telehealth: Payer: Self-pay

## 2022-01-05 LAB — CBC WITH DIFFERENTIAL/PLATELET
Basophils Absolute: 0.1 10*3/uL (ref 0.0–0.2)
Basos: 1 %
EOS (ABSOLUTE): 0.1 10*3/uL (ref 0.0–0.4)
Eos: 1 %
Hematocrit: 41.1 % (ref 34.0–46.6)
Hemoglobin: 14.1 g/dL (ref 11.1–15.9)
Immature Grans (Abs): 0 10*3/uL (ref 0.0–0.1)
Immature Granulocytes: 0 %
Lymphocytes Absolute: 3.3 10*3/uL — ABNORMAL HIGH (ref 0.7–3.1)
Lymphs: 43 %
MCH: 31.3 pg (ref 26.6–33.0)
MCHC: 34.3 g/dL (ref 31.5–35.7)
MCV: 91 fL (ref 79–97)
Monocytes Absolute: 0.5 10*3/uL (ref 0.1–0.9)
Monocytes: 7 %
Neutrophils Absolute: 3.6 10*3/uL (ref 1.4–7.0)
Neutrophils: 48 %
Platelets: 292 10*3/uL (ref 150–450)
RBC: 4.51 x10E6/uL (ref 3.77–5.28)
RDW: 13.1 % (ref 11.7–15.4)
WBC: 7.6 10*3/uL (ref 3.4–10.8)

## 2022-01-05 LAB — CREATININE, SERUM
Creatinine, Ser: 0.76 mg/dL (ref 0.57–1.00)
eGFR: 89 mL/min/{1.73_m2} (ref >59)

## 2022-01-05 LAB — ALT: ALT: 35 IU/L — ABNORMAL HIGH (ref 0–32)

## 2022-01-05 LAB — BUN: BUN: 17 mg/dL (ref 8–27)

## 2022-01-05 LAB — AST: AST: 28 IU/L (ref 0–40)

## 2022-01-05 NOTE — Telephone Encounter (Addendum)
Called patient with results. Patient states she has viewed results on mychart. Denied further questions and verbalized understanding.   ----- Message from Ralene Bathe, MD sent at 01/05/2022  3:09 PM EST ----- Lab from 01/04/2022 were all OK. Blood counts; liver and kidney lab OK. Continue Methotrexate (and Folic acid) for Lipodermatosclerosis Keep 3 mos fu appt

## 2022-01-10 DIAGNOSIS — G4733 Obstructive sleep apnea (adult) (pediatric): Secondary | ICD-10-CM | POA: Diagnosis not present

## 2022-01-18 ENCOUNTER — Encounter: Payer: Self-pay | Admitting: Dermatology

## 2022-01-29 DIAGNOSIS — G4733 Obstructive sleep apnea (adult) (pediatric): Secondary | ICD-10-CM | POA: Diagnosis not present

## 2022-02-02 ENCOUNTER — Ambulatory Visit: Payer: Self-pay | Admitting: *Deleted

## 2022-02-02 ENCOUNTER — Ambulatory Visit: Payer: 59 | Admitting: Family Medicine

## 2022-02-02 ENCOUNTER — Encounter: Payer: Self-pay | Admitting: Family Medicine

## 2022-02-02 VITALS — BP 135/64 | HR 97 | Temp 98.4°F | Resp 16

## 2022-02-02 DIAGNOSIS — U071 COVID-19: Secondary | ICD-10-CM

## 2022-02-02 DIAGNOSIS — R509 Fever, unspecified: Secondary | ICD-10-CM

## 2022-02-02 LAB — POC COVID19 BINAXNOW: SARS Coronavirus 2 Ag: POSITIVE — AB

## 2022-02-02 MED ORDER — NIRMATRELVIR/RITONAVIR (PAXLOVID)TABLET: 3.0000 | ORAL_TABLET | Freq: Two times a day (BID) | ORAL | 0 refills | Status: AC

## 2022-02-02 NOTE — Progress Notes (Signed)
   SUBJECTIVE:   CHIEF COMPLAINT / HPI:   UPPER RESPIRATORY TRACT INFECTION - COVID positive today - symptom onset yesterday - has received 3 doses of mRNA vaccine.  Fever: yes, Tmax 101F Cough: yes Shortness of breath: no Chest pain: no Chest congestion: no Nasal congestion: yes Runny nose: yes Sneezing: yes Sore throat: no Sinus pressure: yes Vomiting: no Sick contacts: yes Relief with OTC cold/cough medications: hasn't tried  Treatments attempted: tylenol    OBJECTIVE:   BP 135/64 (BP Location: Right Arm, Patient Position: Sitting, Cuff Size: Normal)   Pulse 97   Temp 98.4 F (36.9 C) (Oral)   Resp 16   SpO2 97%   Gen: tired appearing, in NAD Card: RRR Lungs: CTAB Ext: WWP, no edema   ASSESSMENT/PLAN:   COVID-19 Moderate sx. Is a candidate for COVID treatment, rx paxlovid sent to pharmacy. Reviewed OTC symptom relief, self-quarantine guidelines, and emergency precautions.     Myles Gip, DO

## 2022-02-02 NOTE — Telephone Encounter (Signed)
Reason for Disposition  [1] Continuous (nonstop) coughing interferes with work or school AND [2] no improvement using cough treatment per Care Advice  Answer Assessment - Initial Assessment Questions 1. ONSET: "When did the cough begin?"      Yesterday 2. SEVERITY: "How bad is the cough today?"      Varies 3. SPUTUM: "Describe the color of your sputum" (none, dry cough; clear, white, yellow, green)     None 4. HEMOPTYSIS: "Are you coughing up any blood?" If so ask: "How much?" (flecks, streaks, tablespoons, etc.)     no 5. DIFFICULTY BREATHING: "Are you having difficulty breathing?" If Yes, ask: "How bad is it?" (e.g., mild, moderate, severe)    - MILD: No SOB at rest, mild SOB with walking, speaks normally in sentences, can lie down, no retractions, pulse < 100.    - MODERATE: SOB at rest, SOB with minimal exertion and prefers to sit, cannot lie down flat, speaks in phrases, mild retractions, audible wheezing, pulse 100-120.    - SEVERE: Very SOB at rest, speaks in single words, struggling to breathe, sitting hunched forward, retractions, pulse > 120      No 6. FEVER: "Do you have a fever?" If Yes, ask: "What is your temperature, how was it measured, and when did it start?"     101.0 last night, 100.5 this AM 7. CARDIAC HISTORY: "Do you have any history of heart disease?" (e.g., heart attack, congestive heart failure)      NA 8. LUNG HISTORY: "Do you have any history of lung disease?"  (e.g., pulmonary embolus, asthma, emphysema)     NA 9. PE RISK FACTORS: "Do you have a history of blood clots?" (or: recent major surgery, recent prolonged travel, bedridden)     NA 10. OTHER SYMPTOMS: "Do you have any other symptoms?" (e.g., runny nose, wheezing, chest pain)       Frontal headache, sinus pain, runny nose, sneezing,  Protocols used: Cough - Acute Non-Productive-A-AH

## 2022-02-02 NOTE — Telephone Encounter (Signed)
Patient was seen in office

## 2022-02-02 NOTE — Telephone Encounter (Signed)
Pt. Reports she has tested positive for COVID 19. Has appointment today at 1:00.

## 2022-02-02 NOTE — Patient Instructions (Signed)
It was great to see you!  Our plans for today:  - See below for self-isolation guidelines. You may end your quarantine once you are 10 days from symptom onset and fever free for 24 hours without use of tylenol or ibuprofen. Wear a well-fitting N95 mask if you have to go out and about. - Take the paxlovid as directed. See HotterNames.de for more information about the medication. - I recommend getting vaccinated once you are healed from your current infection. - Certainly, if you are having difficulties breathing or unable to keep down fluids, go to the Emergency Department.   Take care and seek immediate care sooner if you develop any concerns.   Dr. Ky Barban     Person Under Monitoring Name: Hannah Gonzalez  Location: [place] [address]   Infection Prevention Recommendations for Individuals Confirmed to have, or Being Evaluated for, 2019 Novel Coronavirus (COVID-19) Infection Who Receive Care at Home  Individuals who are confirmed to have, or are being evaluated for, COVID-19 should follow the prevention steps below until a healthcare provider or local or state health department says they can return to normal activities.  Stay home except to get medical care You should restrict activities outside your home, except for getting medical care. Do not go to work, school, or public areas, and do not use public transportation or taxis.  Call ahead before visiting your doctor Before your medical appointment, call the healthcare provider and tell them that you have, or are being evaluated for, COVID-19 infection. This will help the healthcare provider's office take steps to keep other people from getting infected. Ask your healthcare provider to call the local or state health department.  Monitor your symptoms Seek prompt medical attention if your illness is worsening (e.g., difficulty breathing). Before going to your  medical appointment, call the healthcare provider and tell them that you have, or are being evaluated for, COVID-19 infection. Ask your healthcare provider to call the local or state health department.  Wear a facemask You should wear a facemask that covers your nose and mouth when you are in the same room with other people and when you visit a healthcare provider. People who live with or visit you should also wear a facemask while they are in the same room with you.  Separate yourself from other people in your home As much as possible, you should stay in a different room from other people in your home. Also, you should use a separate bathroom, if available.  Avoid sharing household items You should not share dishes, drinking glasses, cups, eating utensils, towels, bedding, or other items with other people in your home. After using these items, you should wash them thoroughly with soap and water.  Cover your coughs and sneezes Cover your mouth and nose with a tissue when you cough or sneeze, or you can cough or sneeze into your sleeve. Throw used tissues in a lined trash can, and immediately wash your hands with soap and water for at least 20 seconds or use an alcohol-based hand rub.  Wash your [facility] your hands often and thoroughly with soap and water for at least 20 seconds. You can use an alcohol-based hand sanitizer if soap and water are not available and if your hands are not visibly dirty. Avoid touching your eyes, nose, and mouth with unwashed hands.   Prevention Steps for Caregivers and Household Members of Individuals Confirmed to have, or Being Evaluated for, COVID-19 Infection Being  Cared for in the Home  If you live with, or provide care at home for, a person confirmed to have, or being evaluated for, COVID-19 infection please follow these guidelines to prevent infection:  Follow healthcare provider's instructions Make sure that you understand and can help the  patient follow any healthcare provider instructions for all care.  Provide for the patient's basic needs You should help the patient with basic needs in the home and provide support for getting groceries, prescriptions, and other personal needs.  Monitor the patient's symptoms If they are getting sicker, call his or her medical provider and tell them that the patient has, or is being evaluated for, COVID-19 infection. This will help the healthcare provider's office take steps to keep other people from getting infected. Ask the healthcare provider to call the local or state health department.  Limit the number of people who have contact with the patient If possible, have only one caregiver for the patient. Other household members should stay in another home or place of residence. If this is not possible, they should stay in another room, or be separated from the patient as much as possible. Use a separate bathroom, if available. Restrict visitors who do not have an essential need to be in the home.  Keep older adults, very young children, and other sick people away from the patient Keep older adults, very young children, and those who have compromised immune systems or chronic health conditions away from the patient. This includes people with chronic heart, lung, or kidney conditions, diabetes, and cancer.  Ensure good ventilation Make sure that shared spaces in the home have good air flow, such as from an air conditioner or an opened window, weather permitting.  Wash your hands often Wash your hands often and thoroughly with soap and water for at least 20 seconds. You can use an alcohol based hand sanitizer if soap and water are not available and if your hands are not visibly dirty. Avoid touching your eyes, nose, and mouth with unwashed hands. Use disposable paper towels to dry your hands. If not available, use dedicated cloth towels and replace them when they become wet.  Wear a  facemask and gloves Wear a disposable facemask at all times in the room and gloves when you touch or have contact with the patient's blood, body fluids, and/or secretions or excretions, such as sweat, saliva, sputum, nasal mucus, vomit, urine, or feces.  Ensure the mask fits over your nose and mouth tightly, and do not touch it during use. Throw out disposable facemasks and gloves after using them. Do not reuse. Wash your hands immediately after removing your facemask and gloves. If your personal clothing becomes contaminated, carefully remove clothing and launder. Wash your hands after handling contaminated clothing. Place all used disposable facemasks, gloves, and other waste in a lined container before disposing them with other household waste. Remove gloves and wash your hands immediately after handling these items.  Do not share dishes, glasses, or other household items with the patient Avoid sharing household items. You should not share dishes, drinking glasses, cups, eating utensils, towels, bedding, or other items with a patient who is confirmed to have, or being evaluated for, COVID-19 infection. After the person uses these items, you should wash them thoroughly with soap and water.  Wash laundry thoroughly Immediately remove and wash clothes or bedding that have blood, body fluids, and/or secretions or excretions, such as sweat, saliva, sputum, nasal mucus, vomit, urine, or feces, on them.  Wear gloves when handling laundry from the patient. Read and follow directions on labels of laundry or clothing items and detergent. In general, wash and dry with the warmest temperatures recommended on the label.  Clean all areas the individual has used often Clean all touchable surfaces, such as counters, tabletops, doorknobs, bathroom fixtures, toilets, phones, keyboards, tablets, and bedside tables, every day. Also, clean any surfaces that may have blood, body fluids, and/or secretions or excretions  on them. Wear gloves when cleaning surfaces the patient has come in contact with. Use a diluted bleach solution (e.g., dilute bleach with 1 part bleach and 10 parts water) or a household disinfectant with a label that says EPA-registered for coronaviruses. To make a bleach solution at home, add 1 tablespoon of bleach to 1 quart (4 cups) of water. For a larger supply, add  cup of bleach to 1 gallon (16 cups) of water. Read labels of cleaning products and follow recommendations provided on product labels. Labels contain instructions for safe and effective use of the cleaning product including precautions you should take when applying the product, such as wearing gloves or eye protection and making sure you have good ventilation during use of the product. Remove gloves and wash hands immediately after cleaning.  Monitor yourself for signs and symptoms of illness Caregivers and household members are considered close contacts, should monitor their health, and will be asked to limit movement outside of the home to the extent possible. Follow the monitoring steps for close contacts listed on the symptom monitoring form.   ? If you have additional questions, contact your local health department or call the epidemiologist on call at [phone] (available 24/7). ? This guidance is subject to change. For the most up-to-date guidance from [facility], please refer to their website: YouBlogs.pl

## 2022-02-02 NOTE — Telephone Encounter (Signed)
  Chief Complaint: Cough Symptoms: Non-productive cough, LGT 100.5, congestion,sinus pain Frequency: yesterday Pertinent Negatives: Patient denies SOB Disposition: '[]'$ ED /'[]'$ Urgent Care (no appt availability in office) / '[x]'$ Appointment(In office/virtual)/ '[]'$  [facility]/ '[]'$ Home Care/ '[]'$ Refused Recommended Disposition /'[]'$ [facility]/ '[]'$  Follow-up with PCP Additional Notes: Appt secured for today. Care advise provided, pt verbalizes understanding. Advised to Covid test prior to appt if able, states has home tests, will do so and alert practice of results.

## 2022-02-09 DIAGNOSIS — G4733 Obstructive sleep apnea (adult) (pediatric): Secondary | ICD-10-CM | POA: Diagnosis not present

## 2022-02-11 ENCOUNTER — Ambulatory Visit: Payer: Self-pay | Admitting: *Deleted

## 2022-02-11 ENCOUNTER — Other Ambulatory Visit: Payer: Self-pay | Admitting: Family Medicine

## 2022-02-11 MED ORDER — PREDNISONE 50 MG PO TABS: 50.0000 mg | ORAL_TABLET | Freq: Every day | ORAL | 0 refills | Status: DC

## 2022-02-11 MED ORDER — AMOXICILLIN-POT CLAVULANATE 875-125 MG PO TABS: 1.0000 | ORAL_TABLET | Freq: Two times a day (BID) | ORAL | 0 refills | Status: DC

## 2022-02-11 NOTE — Telephone Encounter (Signed)
Summary: sinus issue   Ppt tested positive for covid 10 days ago/ she was given paxlovid and finished the 5 day medication / she stated most of her symptoms are gone but she has bad sinus issue / pt is not sure what to do or if she needs something or if she should let it run its coarse / please advise       Chief Complaint: requesting medication for sinus issues. Tested positive for covid 10 days ago and treated with antiviral and tested positive again today for covid.  Symptoms: sinus pain, facial pain ear pressure, unable to breath out of nose last night has to sit up. Nose stopped up. Blowing nose for yellow mucus. Sneezing continues. Reports feeling better than 10 days ago. Tested positive again today for covid. Frequency: 10 days  Pertinent Negatives: Patient denies chest pain no difficulty breathing no fever. Can breath through nose now  Disposition: '[]'$ ED /'[]'$ Urgent Care (no appt availability in office) / '[]'$ Appointment(In office/virtual)/ '[]'$  [facility]/ '[]'$ Home Care/ '[]'$ Refused Recommended Disposition /'[]'$ [facility]/ '[x]'$  Follow-up with PCP Additional Notes:   Last seen 02/02/22 OV. Sx remain. 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 Please advise.        Reason for Disposition  [1] Sinus congestion (pressure, fullness) AND [2] present > 10 days  Answer Assessment - Initial Assessment Questions 1. LOCATION: "Where does it hurt?"      Facial pressure ear pressure  2. ONSET: "When did the sinus pain start?"  (e.g., hours, days)      10 days ago  3. SEVERITY: "How bad is the pain?"   (Scale 1-10; mild, moderate or severe)   - MILD (1-3): doesn't interfere with normal activities    - MODERATE (4-7): interferes with normal activities (e.g., work or school) or awakens from sleep   - SEVERE (8-10): excruciating pain and patient  unable to do any normal activities        Moderate interferes with sleep difficulty breathing through nose  4. RECURRENT SYMPTOM: "Have you ever had sinus problems before?" If Yes, ask: "When was the last time?" and "What happened that time?"      Na  5. NASAL CONGESTION: "Is the nose blocked?" If Yes, ask: "Can you open it or must you breathe through your mouth?"     Yes  6. NASAL DISCHARGE: "Do you have discharge from your nose?" If so ask, "What color?"     Yes yellow mucus  7. FEVER: "Do you have a fever?" If Yes, ask: "What is it, how was it measured, and when did it start?"      na 8. OTHER SYMPTOMS: "Do you have any other symptoms?" (e.g., sore throat, cough, earache, difficulty breathing)     Ear pressure sinus pressure,facial pain  9. PREGNANCY: "Is there any chance you are pregnant?" "When was your last menstrual period?"     na  Protocols used: Sinus Pain or Congestion-A-AH

## 2022-02-11 NOTE — Telephone Encounter (Signed)
Please advise 

## 2022-02-23 ENCOUNTER — Encounter (INDEPENDENT_AMBULATORY_CARE_PROVIDER_SITE_OTHER): Payer: 59 | Admitting: Family Medicine

## 2022-02-23 DIAGNOSIS — B370 Candidal stomatitis: Secondary | ICD-10-CM

## 2022-02-24 MED ORDER — NYSTATIN 100000 UNIT/ML MT SUSP: 5.0000 mL | Freq: Four times a day (QID) | OROMUCOSAL | 0 refills | Status: DC

## 2022-02-24 NOTE — Telephone Encounter (Signed)

## 2022-03-01 DIAGNOSIS — G4733 Obstructive sleep apnea (adult) (pediatric): Secondary | ICD-10-CM | POA: Diagnosis not present

## 2022-03-04 ENCOUNTER — Encounter: Payer: Self-pay | Admitting: Family Medicine

## 2022-03-11 ENCOUNTER — Encounter: Payer: Self-pay | Admitting: Dermatology

## 2022-03-11 ENCOUNTER — Other Ambulatory Visit: Payer: Self-pay | Admitting: Dermatology

## 2022-03-12 DIAGNOSIS — G4733 Obstructive sleep apnea (adult) (pediatric): Secondary | ICD-10-CM | POA: Diagnosis not present

## 2022-03-17 DIAGNOSIS — H6983 Other specified disorders of Eustachian tube, bilateral: Secondary | ICD-10-CM | POA: Diagnosis not present

## 2022-03-17 DIAGNOSIS — H903 Sensorineural hearing loss, bilateral: Secondary | ICD-10-CM | POA: Diagnosis not present

## 2022-03-17 DIAGNOSIS — K121 Other forms of stomatitis: Secondary | ICD-10-CM | POA: Diagnosis not present

## 2022-03-17 DIAGNOSIS — H6123 Impacted cerumen, bilateral: Secondary | ICD-10-CM | POA: Diagnosis not present

## 2022-04-01 DIAGNOSIS — G4733 Obstructive sleep apnea (adult) (pediatric): Secondary | ICD-10-CM | POA: Diagnosis not present

## 2022-04-06 NOTE — Progress Notes (Unsigned)
I,Aki Abalos S Zeriah Baysinger,acting as a Education administrator for Lavon Paganini, MD.,have documented all relevant documentation on the behalf of Lavon Paganini, MD,as directed by  Lavon Paganini, MD while in the presence of Lavon Paganini, MD.    Complete physical exam   Patient: Hannah Gonzalez   DOB: 11-20-1960   62 y.o. Female  MRN: [id number] Visit Date: 04/07/2022  Today's healthcare provider: Lavon Paganini, MD   No chief complaint on file.  Subjective    Hannah Gonzalez is a 62 y.o. female who presents today for a complete physical exam.  She reports consuming a {diet types:17450} diet. {Exercise:19826} She generally feels {well/fairly well/poorly:18703}. She reports sleeping {well/fairly well/poorly:18703}. She {does/does not:200015} have additional problems to discuss today.  HPI    Past Medical History:  Diagnosis Date   Arthritis    Basal cell carcinoma 05/04/2001   Basal cell left thigh   Hx of basal cell carcinoma 05/04/2001   L sup. ant. 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   Procedure: TOTAL HIP ARTHROPLASTY ANTERIOR APPROACH;  Surgeon: Lovell Sheehan, MD;  Location: [facility] ORS;  Service: Orthopedics;  Laterality: Right;   Social History   Socioeconomic History   Marital status: Married    Spouse name: Shanon Brow   Number of children: 0   Years of education: associates   Highest education level: Not on file  Occupational History    Employer: [employer]  Tobacco Use   Smoking status: Never   Smokeless tobacco: Never  Vaping Use   Vaping Use: Never used  Substance and Sexual Activity   Alcohol use: No   Drug use: No   Sexual activity: Not Currently    Birth control/protection: Post-menopausal  Other Topics Concern   Not on file  Social History Narrative   Not on file   Social Determinants of Health   Financial Resource Strain: Not on file  Food Insecurity: Not on file  Transportation Needs: Not on file  Physical Activity: Inactive (04/07/2017)   Exercise Vital Sign    Days of Exercise per Week: 0 days    Minutes of Exercise per Session: 0 min  Stress: No Stress Concern Present (04/07/2017)   [place]ometown    Feeling of Stress : Only a little  Social Connections: Not on file  Intimate Partner Violence: Not on file   Family Status  Relation Name Status   Mother  Alive   Brother  Alive   Father  Deceased       pacemaker implant   Cousin maternal Alive   Mat Aunt  (Not Specified)   Family History  Problem Relation Age of Onset   Hypertension Mother    Hyperlipidemia Mother    Heart disease Mother  Heart failure Mother    Arrhythmia Mother        A-Fib   Arthritis Mother    Cancer Mother    Hypertension Brother    Hyperlipidemia Brother    Arthritis Father    Hypertension Father    Prostate cancer Father    Breast cancer Cousin    Cancer Maternal Aunt    Breast cancer Maternal Aunt    Allergies  Allergen Reactions   Aspirin Hives, Rash, Anaphylaxis and Itching    Patient Care Team: Virginia Crews, MD as PCP - General (Family Medicine) Minna Merritts, MD as Consulting Physician (Cardiology)   Medications: Outpatient Medications Prior to Visit  Medication Sig   amoxicillin-clavulanate (AUGMENTIN) 875-125 MG tablet Take 1 tablet by mouth 2 (two) times daily.   Cetirizine HCl 10 MG CAPS Take 10 mg by mouth daily.     Cholecalciferol (VITAMIN D3) 1000 units CAPS Take 1,000 Units by mouth daily.    CRANBERRY PO Take 1 capsule by mouth daily.   folic acid (FOLVITE) 1 MG tablet Take 1 tablet (1 mg total) by mouth Three (3) times a day. Take on non-methotrexate days   methotrexate (RHEUMATREX) 2.5 MG tablet TAKE 4 TABLETS BY MOUTH ONCE WEEKLY. CAUTION: CHEMOTHERAPY. PROTECT FROM LIGHT   metoprolol succinate (TOPROL-XL) 50 MG 24 hr tablet Take 1.5 tablets (75 mg total) by mouth daily.   Multiple Vitamins-Minerals (ONE-A-DAY WOMENS 50 PLUS PO) Take 1 tablet by mouth daily.   nystatin (MYCOSTATIN) 100000 UNIT/ML suspension Take 5 mLs (500,000 Units total) by mouth 4 (four) times daily.   predniSONE (DELTASONE) 50 MG tablet Take 1 tablet (50 mg total) by mouth daily with breakfast.   tolterodine (DETROL LA) 4 MG 24 hr capsule Take 1 capsule (4 mg total) by mouth daily.   No facility-administered medications prior to visit.    Review of Systems  {Labs  Heme  Chem  Endocrine  Serology  Results Review (optional):23779}  Objective    There were no vitals taken for this visit. {Show previous vital signs (optional):23777}   Physical Exam  ***  Last depression screening scores    12/30/2021    9:47 AM 04/05/2021    9:17 AM 04/01/2020    9:23 AM  PHQ 2/9 Scores  PHQ - 2 Score 0 0 0  PHQ- 9 Score 1 2 0   Last fall risk screening    12/30/2021    9:47 AM  [place] in the past year? 0  Number falls in past yr: 0  Injury with Fall? 0  Risk for fall due to : No Fall Risks  Follow up Falls evaluation completed   Last Audit-C alcohol use screening    12/30/2021    9:47 AM  Alcohol Use Disorder Test (AUDIT)  1. How often do you have a drink containing alcohol? 0  2. How many drinks containing alcohol do you have on a typical day when you are drinking? 0  3. How often do you have six or more drinks on one occasion? 0  AUDIT-C Score 0   A score of 3 or more in women, and 4 or more in men  indicates increased risk for alcohol abuse, EXCEPT if all of the points are from question 1   No results found for any visits on 04/07/22.  Assessment & Plan    Routine Health Maintenance and Physical Exam  Exercise Activities and Dietary recommendations  Goals   None  Immunization History  Administered Date(s) Administered   Influenza Inj Mdck Quad Pf 11/17/2018   Influenza Split 11/22/2015   Influenza Whole 11/21/2016   Influenza,inj,Quad PF,6+ Mos 11/05/2021   Influenza-Unspecified 12/03/2014, 11/04/2020   Td 09/21/2015   Tdap 08/30/2005    Health Maintenance  Topic Date Due   COVID-19 Vaccine (1) Never done   Zoster Vaccines- Shingrix (1 of 2) 07/04/2022 (Originally 04/13/1978)   MAMMOGRAM  05/12/2023   PAP SMEAR-Modifier  06/04/2024   COLONOSCOPY (Pts 45-52yr Insurance coverage will need to be confirmed)  05/18/2025   DTaP/Tdap/Td (3 - Td or Tdap) 09/20/2025   INFLUENZA VACCINE  Completed   Hepatitis C Screening  Completed   HIV Screening  Completed   HPV VACCINES  Aged Out    Discussed health benefits of physical activity, and encouraged her to engage in regular exercise appropriate for her age and condition.  ***  No follow-ups on file.     {provider attestation***:1}   ALavon Paganini MD  [phone](phone) [phone](fax)  [facility]

## 2022-04-07 ENCOUNTER — Ambulatory Visit (INDEPENDENT_AMBULATORY_CARE_PROVIDER_SITE_OTHER): Payer: 59 | Admitting: Family Medicine

## 2022-04-07 ENCOUNTER — Encounter: Payer: Self-pay | Admitting: Family Medicine

## 2022-04-07 VITALS — BP 132/75 | HR 85 | Temp 97.8°F | Resp 16 | Ht 68.0 in | Wt 178.9 lb

## 2022-04-07 DIAGNOSIS — I10 Essential (primary) hypertension: Secondary | ICD-10-CM | POA: Diagnosis not present

## 2022-04-07 DIAGNOSIS — Z1231 Encounter for screening mammogram for malignant neoplasm of breast: Secondary | ICD-10-CM | POA: Diagnosis not present

## 2022-04-07 DIAGNOSIS — E559 Vitamin D deficiency, unspecified: Secondary | ICD-10-CM | POA: Diagnosis not present

## 2022-04-07 DIAGNOSIS — E782 Mixed hyperlipidemia: Secondary | ICD-10-CM | POA: Diagnosis not present

## 2022-04-07 DIAGNOSIS — G4733 Obstructive sleep apnea (adult) (pediatric): Secondary | ICD-10-CM

## 2022-04-07 DIAGNOSIS — Z79899 Other long term (current) drug therapy: Secondary | ICD-10-CM | POA: Diagnosis not present

## 2022-04-07 DIAGNOSIS — Z0000 Encounter for general adult medical examination without abnormal findings: Secondary | ICD-10-CM

## 2022-04-07 NOTE — Assessment & Plan Note (Signed)
Continue supplement Recheck level 

## 2022-04-07 NOTE — Assessment & Plan Note (Signed)
Well controlled Continue current medications Recheck metabolic panel F/u in 6 months  

## 2022-04-07 NOTE — Assessment & Plan Note (Signed)
Reviewed last lipid panel Not currently on a statin Recheck FLP and CMP Discussed diet and exercise  

## 2022-04-07 NOTE — Assessment & Plan Note (Signed)
Quality of sleep improved significantly  Using CPAP regularly with good compliance

## 2022-04-12 DIAGNOSIS — G4733 Obstructive sleep apnea (adult) (pediatric): Secondary | ICD-10-CM | POA: Diagnosis not present

## 2022-04-14 DIAGNOSIS — E559 Vitamin D deficiency, unspecified: Secondary | ICD-10-CM | POA: Diagnosis not present

## 2022-04-14 DIAGNOSIS — I10 Essential (primary) hypertension: Secondary | ICD-10-CM | POA: Diagnosis not present

## 2022-04-14 DIAGNOSIS — Z0000 Encounter for general adult medical examination without abnormal findings: Secondary | ICD-10-CM | POA: Diagnosis not present

## 2022-04-14 DIAGNOSIS — Z79899 Other long term (current) drug therapy: Secondary | ICD-10-CM | POA: Diagnosis not present

## 2022-04-14 DIAGNOSIS — E782 Mixed hyperlipidemia: Secondary | ICD-10-CM | POA: Diagnosis not present

## 2022-04-15 LAB — COMPREHENSIVE METABOLIC PANEL
ALT: 36 IU/L — ABNORMAL HIGH (ref 0–32)
AST: 29 IU/L (ref 0–40)
Albumin/Globulin Ratio: 2.3 — ABNORMAL HIGH (ref 1.2–2.2)
Albumin: 4.4 g/dL (ref 3.9–4.9)
Alkaline Phosphatase: 70 IU/L (ref 44–121)
BUN/Creatinine Ratio: 14 (ref 12–28)
BUN: 11 mg/dL (ref 8–27)
Bilirubin Total: 0.3 mg/dL (ref 0.0–1.2)
CO2: 23 mmol/L (ref 20–29)
Calcium: 9.4 mg/dL (ref 8.7–10.3)
Chloride: 105 mmol/L (ref 96–106)
Creatinine, Ser: 0.78 mg/dL (ref 0.57–1.00)
Globulin, Total: 1.9 g/dL (ref 1.5–4.5)
Glucose: 97 mg/dL (ref 70–99)
Potassium: 4.4 mmol/L (ref 3.5–5.2)
Sodium: 143 mmol/L (ref 134–144)
Total Protein: 6.3 g/dL (ref 6.0–8.5)
eGFR: 85 mL/min/{1.73_m2} (ref >59)

## 2022-04-15 LAB — CBC
Hematocrit: 39.4 % (ref 34.0–46.6)
Hemoglobin: 13.4 g/dL (ref 11.1–15.9)
MCH: 30.9 pg (ref 26.6–33.0)
MCHC: 34 g/dL (ref 31.5–35.7)
MCV: 91 fL (ref 79–97)
Platelets: 260 10*3/uL (ref 150–450)
RBC: 4.34 x10E6/uL (ref 3.77–5.28)
RDW: 13.2 % (ref 11.7–15.4)
WBC: 5.1 10*3/uL (ref 3.4–10.8)

## 2022-04-15 LAB — LIPID PANEL
Chol/HDL Ratio: 4.8 ratio — ABNORMAL HIGH (ref 0.0–4.4)
Cholesterol, Total: 242 mg/dL — ABNORMAL HIGH (ref 100–199)
HDL: 50 mg/dL (ref >39)
LDL Chol Calc (NIH): 161 mg/dL — ABNORMAL HIGH (ref 0–99)
Triglycerides: 169 mg/dL — ABNORMAL HIGH (ref 0–149)
VLDL Cholesterol Cal: 31 mg/dL (ref 5–40)

## 2022-04-15 LAB — TSH: TSH: 3.1 u[IU]/mL (ref 0.450–4.500)

## 2022-04-15 LAB — VITAMIN D 25 HYDROXY (VIT D DEFICIENCY, FRACTURES): Vit D, 25-Hydroxy: 47.3 ng/mL (ref 30.0–100.0)

## 2022-04-21 DIAGNOSIS — G4733 Obstructive sleep apnea (adult) (pediatric): Secondary | ICD-10-CM | POA: Diagnosis not present

## 2022-04-25 ENCOUNTER — Encounter: Payer: Self-pay | Admitting: Dermatology

## 2022-04-25 ENCOUNTER — Ambulatory Visit: Payer: 59 | Admitting: Dermatology

## 2022-04-25 VITALS — BP 127/81 | HR 97

## 2022-04-25 DIAGNOSIS — M793 Panniculitis, unspecified: Secondary | ICD-10-CM | POA: Diagnosis not present

## 2022-04-25 DIAGNOSIS — Z79899 Other long term (current) drug therapy: Secondary | ICD-10-CM | POA: Diagnosis not present

## 2022-04-25 MED ORDER — METHOTREXATE SODIUM 2.5 MG PO TABS: ORAL_TABLET | ORAL | 2 refills | Status: DC

## 2022-04-25 MED ORDER — FOLIC ACID 1 MG PO TABS: ORAL_TABLET | ORAL | 1 refills | Status: DC

## 2022-04-25 NOTE — Patient Instructions (Addendum)
Due to recent changes in healthcare laws, you may see results of your pathology and/or laboratory studies on MyChart before the doctors have had a chance to review them. We understand that in some cases there may be results that are confusing or concerning to you. Please understand that not all results are received at the same time and often the doctors may need to interpret multiple results in order to provide you with the best plan of care or course of treatment. Therefore, we ask that you please give us 2 business days to thoroughly review all your results before contacting the office for clarification. Should we see a critical lab result, you will be contacted sooner.   If You Need Anything After Your Visit  If you have any questions or concerns for your doctor, please call our main line at [phone] and press option 4 to reach your doctor's medical assistant. If no one answers, please leave a voicemail as directed and we will return your call as soon as possible. Messages left after 4 pm will be answered the following business day.   You may also send us a message via MyChart. We typically respond to MyChart messages within 1-2 business days.  For prescription refills, please ask your pharmacy to contact our office. Our fax number is [phone].  If you have an urgent issue when the clinic is closed that cannot wait until the next business day, you can page your doctor at the number below.    Please note that while we do our best to be available for urgent issues outside of office hours, we are not available 24/7.   If you have an urgent issue and are unable to reach us, you may choose to seek medical care at your doctor's office, retail clinic, urgent care center, or emergency room.  If you have a medical emergency, please immediately call 911 or go to the emergency department.  Pager Numbers  - Dr. Kowalski: [phone]  - Dr. Moye: [phone]  - Dr. Stewart:  [phone]  In the event of inclement weather, please call our main line at [phone] for an update on the status of any delays or closures.  Dermatology Medication Tips: Please keep the boxes that topical medications come in in order to help keep track of the instructions about where and how to use these. Pharmacies typically print the medication instructions only on the boxes and not directly on the medication tubes.   If your medication is too expensive, please contact our office at [phone] option 4 or send us a message through MyChart.   We are unable to tell what your co-pay for medications will be in advance as this is different depending on your insurance coverage. However, we may be able to find a substitute medication at lower cost or fill out paperwork to get insurance to cover a needed medication.   If a prior authorization is required to get your medication covered by your insurance company, please allow us 1-2 business days to complete this process.  Drug prices often vary depending on where the prescription is filled and some pharmacies may offer cheaper prices.  The website www.goodrx.com contains coupons for medications through different pharmacies. The prices here do not account for what the cost may be with help from insurance (it may be cheaper with your insurance), but the website can give you the price if you did not use any insurance.  - You can print the associated coupon and take it with   your prescription to the pharmacy.  - You may also stop by our office during regular business hours and pick up a GoodRx coupon card.  - If you need your prescription sent electronically to a different pharmacy, notify our office through [facility] MyChart or by phone at [phone] option 4.     Si Usted Necesita Algo Despus de Su Visita  Tambin puede enviarnos un mensaje a travs de MyChart. Por lo general respondemos a los mensajes de MyChart en el transcurso de 1 a 2  das hbiles.  Para renovar recetas, por favor pida a su farmacia que se ponga en contacto con nuestra oficina. Nuestro nmero de fax es el [phone].  Si tiene un asunto urgente cuando la clnica est cerrada y que no puede esperar hasta el siguiente da hbil, puede llamar/localizar a su doctor(a) al nmero que aparece a continuacin.   Por favor, tenga en cuenta que aunque hacemos todo lo posible para estar disponibles para asuntos urgentes fuera del horario de oficina, no estamos disponibles las 24 horas del da, los 7 das de la semana.   Si tiene un problema urgente y no puede comunicarse con nosotros, puede optar por buscar atencin mdica  en el consultorio de su doctor(a), en una clnica privada, en un centro de atencin urgente o en una sala de emergencias.  Si tiene una emergencia mdica, por favor llame inmediatamente al 911 o vaya a la sala de emergencias.  Nmeros de bper  - Dr. Kowalski: [phone]  - Dra. Moye: [phone]  - Dra. Stewart: [phone]  En caso de inclemencias del tiempo, por favor llame a nuestra lnea principal al [phone] para una actualizacin sobre el estado de cualquier retraso o cierre.  Consejos para la medicacin en dermatologa: Por favor, guarde las cajas en las que vienen los medicamentos de uso tpico para ayudarle a seguir las instrucciones sobre dnde y cmo usarlos. Las farmacias generalmente imprimen las instrucciones del medicamento slo en las cajas y no directamente en los tubos del medicamento.   Si su medicamento es muy caro, por favor, pngase en contacto con nuestra oficina llamando al [phone] y presione la opcin 4 o envenos un mensaje a travs de MyChart.   No podemos decirle cul ser su copago por los medicamentos por adelantado ya que esto es diferente dependiendo de la cobertura de su seguro. Sin embargo, es posible que podamos encontrar un medicamento sustituto a menor costo o llenar un formulario para que el  seguro cubra el medicamento que se considera necesario.   Si se requiere una autorizacin previa para que su compaa de seguros cubra su medicamento, por favor permtanos de 1 a 2 das hbiles para completar este proceso.  Los precios de los medicamentos varan con frecuencia dependiendo del lugar de dnde se surte la receta y alguna farmacias pueden ofrecer precios ms baratos.  El sitio web www.goodrx.com tiene cupones para medicamentos de diferentes farmacias. Los precios aqu no tienen en cuenta lo que podra costar con la ayuda del seguro (puede ser ms barato con su seguro), pero el sitio web puede darle el precio si no utiliz ningn seguro.  - Puede imprimir el cupn correspondiente y llevarlo con su receta a la farmacia.  - Tambin puede pasar por nuestra oficina durante el horario de atencin regular y recoger una tarjeta de cupones de GoodRx.  - Si necesita que su receta se enve electrnicamente a una farmacia diferente, informe a nuestra oficina a travs de MyChart de [facility]   o por telfono llamando al [phone] y presione la opcin 4.  

## 2022-04-25 NOTE — Progress Notes (Signed)
   Follow-Up Visit   Subjective  Hannah Gonzalez is a 62 y.o. female who presents for the following: Follow-up (3 month follow up, currently on MTX and folic acid. ).  She has some side effects from the methotrexate such as tiredness.  She feels like she has not improved but is stable.  The following portions of the chart were reviewed this encounter and updated as appropriate:  Tobacco  Allergies  Meds  Problems  Med Hx  Surg Hx  Fam Hx     Review of Systems: No other skin or systemic complaints except as noted in HPI or Assessment and Plan.  Objective  Well appearing patient in no apparent distress; mood and affect are within normal limits.  A focused examination was performed including left lower leg. Relevant physical exam findings are noted in the Assessment and Plan.  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 Condition is symptomatic / bothersome to patient. Not to goal.  Has improved with smaller plaques today compared to last visit and not as tender. Reviewed labs 03/2022 liver, kidney, CBC normal   Pt feels her condition is improved, although she does have fatigue symptoms with MTX, but wants to continue for now. Symptoms of pain and sensitivity and erythema almost completely resolved. Induration and dyschromia persistent.   - Continue methotrexate '10mg'$  weekly and folic acid '3mg'$  on non-MTX days - Continue with compression stockings    Long term medication management.  Patient is using long term (months to years) prescription medication  to control their dermatologic condition.  These medications require periodic monitoring to evaluate for efficacy and side effects  and may require periodic laboratory monitoring.  methotrexate (RHEUMATREX) 2.5 MG tablet - Left Lower Leg - Anterior TAKE 4 TABLETS BY MOUTH ONCE WEEKLY. CAUTION: CHEMOTHERAPY. PROTECT FROM LIGHT  Drug therapy  Related Medications folic acid (FOLVITE) 1 MG tablet Take 1 tablet (1 mg total) by mouth Three (3) times a day. Take on non-methotrexate days  Return for keep follow up as scheduled, 4 month follow up Lipodermatoschlerosis .  IRuthell Rummage, CMA, am acting as scribe for Sarina Ser, MD. Documentation: I have reviewed the above documentation for accuracy and completeness, and I agree with the above.  Sarina Ser, MD

## 2022-04-30 ENCOUNTER — Encounter: Payer: Self-pay | Admitting: Dermatology

## 2022-05-02 ENCOUNTER — Ambulatory Visit: Payer: 59 | Admitting: Dermatology

## 2022-05-02 ENCOUNTER — Encounter: Payer: Self-pay | Admitting: Dermatology

## 2022-05-02 VITALS — BP 151/85 | HR 91

## 2022-05-02 DIAGNOSIS — L821 Other seborrheic keratosis: Secondary | ICD-10-CM

## 2022-05-02 DIAGNOSIS — L820 Inflamed seborrheic keratosis: Secondary | ICD-10-CM | POA: Diagnosis not present

## 2022-05-02 DIAGNOSIS — L814 Other melanin hyperpigmentation: Secondary | ICD-10-CM | POA: Diagnosis not present

## 2022-05-02 DIAGNOSIS — L578 Other skin changes due to chronic exposure to nonionizing radiation: Secondary | ICD-10-CM | POA: Diagnosis not present

## 2022-05-02 DIAGNOSIS — Z1283 Encounter for screening for malignant neoplasm of skin: Secondary | ICD-10-CM | POA: Diagnosis not present

## 2022-05-02 DIAGNOSIS — Z85828 Personal history of other malignant neoplasm of skin: Secondary | ICD-10-CM | POA: Diagnosis not present

## 2022-05-02 DIAGNOSIS — D229 Melanocytic nevi, unspecified: Secondary | ICD-10-CM

## 2022-05-02 DIAGNOSIS — Z86018 Personal history of other benign neoplasm: Secondary | ICD-10-CM

## 2022-05-02 DIAGNOSIS — D1801 Hemangioma of skin and subcutaneous tissue: Secondary | ICD-10-CM

## 2022-05-02 NOTE — Progress Notes (Signed)
Follow-Up Visit   Subjective  Hannah Gonzalez is a 62 y.o. female who presents for the following: Annual Exam (1 year tbse, spots at hands she would like checked. Hx of bcc,). The patient presents for Total-Body Skin Exam (TBSE) for skin cancer screening and mole check.  The patient has spots, moles and lesions to be evaluated, some may be new or changing and the patient has concerns that these could be cancer.  The following portions of the chart were reviewed this encounter and updated as appropriate:  Tobacco  Allergies  Meds  Problems  Med Hx  Surg Hx  Fam Hx     Review of Systems: No other skin or systemic complaints except as noted in HPI or Assessment and Plan.  Objective  Well appearing patient in no apparent distress; mood and affect are within normal limits. A full examination was performed including scalp, head, eyes, ears, nose, lips, neck, chest, axillae, abdomen, back, buttocks, bilateral upper extremities, bilateral lower extremities, hands, feet, fingers, toes, fingernails, and toenails. All findings within normal limits unless otherwise noted below.  left thumb x 1,  right dorsal hand x 1 , back x  17, left cheek x 3 (21) Erythematous stuck-on, waxy papule or plaque   Assessment & Plan  Inflamed seborrheic keratosis (21) left thumb x 1,  right dorsal hand x 1 , back x  17, left cheek x 3 Symptomatic, irritating, patient would like treated. Destruction of lesion - left thumb x 1,  right dorsal hand x 1 , back x  17, left cheek x 3 Complexity: simple   Destruction method: cryotherapy   Informed consent: discussed and consent obtained   Timeout:  patient name, date of birth, surgical site, and procedure verified Lesion destroyed using liquid nitrogen: Yes   Region frozen until ice ball extended beyond lesion: Yes   Outcome: patient tolerated procedure well with no complications   Post-procedure details: wound care instructions given   Additional details:   Prior to procedure, discussed risks of blister formation, small wound, skin dyspigmentation, or rare scar following cryotherapy. Recommend Vaseline ointment to treated areas while healing.  Lentigines - Scattered tan macules - Due to sun exposure - Benign-appearing, observe - Recommend daily broad spectrum sunscreen SPF 30+ to sun-exposed areas, reapply every 2 hours as needed. - Call for any changes  Seborrheic Keratoses - Stuck-on, waxy, tan-brown papules and/or plaques  - Benign-appearing - Discussed benign etiology and prognosis. - Observe - Call for any changes  Varicose Veins/Spider Veins - Dilated blue, purple or red veins at the lower extremities - Reassured - Smaller vessels can be treated by sclerotherapy (a procedure to inject a medicine into the veins to make them disappear) if desired, but the treatment is not covered by insurance. Larger vessels may be covered if symptomatic and we would refer to vascular surgeon if treatment desired.  Melanocytic Nevi - Tan-brown and/or pink-flesh-colored symmetric macules and papules - Benign appearing on exam today - Observation - Call clinic for new or changing moles - Recommend daily use of broad spectrum spf 30+ sunscreen to sun-exposed areas.   Hemangiomas - Red papules - Discussed benign nature - Observe - Call for any changes  Actinic Damage - Chronic condition, secondary to cumulative UV/sun exposure - diffuse scaly erythematous macules with underlying dyspigmentation - Recommend daily broad spectrum sunscreen SPF 30+ to sun-exposed areas, reapply every 2 hours as needed.  - Staying in the shade or wearing long sleeves, sun glasses (  UVA+UVB protection) and wide brim hats (4-inch brim around the entire circumference of the hat) are also recommended for sun protection.  - Call for new or changing lesions.  History of Basal Cell Carcinoma of the Skin Left superior anterior thigh 2003 - No evidence of recurrence today -  Recommend regular full body skin exams - Recommend daily broad spectrum sunscreen SPF 30+ to sun-exposed areas, reapply every 2 hours as needed.  - Call if any new or changing lesions are noted between office visits  History of Dysplastic Nevi Multiple sites see history - No evidence of recurrence today - Recommend regular full body skin exams - Recommend daily broad spectrum sunscreen SPF 30+ to sun-exposed areas, reapply every 2 hours as needed.  - Call if any new or changing lesions are noted between office visits  Lipodermatosclerosis of legs See note from last week for assessment and treatment plan  Skin cancer screening performed today.  Return in about 1 year (around 05/02/2023) for TBSE and 6 mos for  lipodermasclerosis .  IRuthell Rummage, CMA, am acting as scribe for Sarina Ser, MD. Documentation: I have reviewed the above documentation for accuracy and completeness, and I agree with the above.  Sarina Ser, MD

## 2022-05-02 NOTE — Patient Instructions (Addendum)
Cryotherapy Aftercare  Wash gently with soap and water everyday.   Apply Vaseline and Band-Aid daily until healed.   Seborrheic Keratosis  What causes seborrheic keratoses? Seborrheic keratoses are harmless, common skin growths that first appear during adult life.  As time goes by, more growths appear.  Some people may develop a large number of them.  Seborrheic keratoses appear on both covered and uncovered body parts.  They are not caused by sunlight.  The tendency to develop seborrheic keratoses can be inherited.  They vary in color from skin-colored to gray, brown, or even black.  They can be either smooth or have a rough, warty surface.   Seborrheic keratoses are superficial and look as if they were stuck on the skin.  Under the microscope this type of keratosis looks like layers upon layers of skin.  That is why at times the top layer may seem to fall off, but the rest of the growth remains and re-grows.    Treatment Seborrheic keratoses do not need to be treated, but can easily be removed in the office.  Seborrheic keratoses often cause symptoms when they rub on clothing or jewelry.  Lesions can be in the way of shaving.  If they become inflamed, they can cause itching, soreness, or burning.  Removal of a seborrheic keratosis can be accomplished by freezing, burning, or surgery. If any spot bleeds, scabs, or grows rapidly, please return to have it checked, as these can be an indication of a skin cancer.    Melanoma ABCDEs  Melanoma is the most dangerous type of skin cancer, and is the leading cause of death from skin disease.  You are more likely to develop melanoma if you: Have light-colored skin, light-colored eyes, or red or blond hair Spend a lot of time in the sun Tan regularly, either outdoors or in a tanning bed Have had blistering sunburns, especially during childhood Have a close family member who has had a melanoma Have atypical moles or large birthmarks  Early detection  of melanoma is key since treatment is typically straightforward and cure rates are extremely high if we catch it early.   The first sign of melanoma is often a change in a mole or a new dark spot.  The ABCDE system is a way of remembering the signs of melanoma.  A for asymmetry:  The two halves do not match. B for border:  The edges of the growth are irregular. C for color:  A mixture of colors are present instead of an even brown color. D for diameter:  Melanomas are usually (but not always) greater than 6mm - the size of a pencil eraser. E for evolution:  The spot keeps changing in size, shape, and color.  Please check your skin once per month between visits. You can use a small mirror in front and a large mirror behind you to keep an eye on the back side or your body.   If you see any new or changing lesions before your next follow-up, please call to schedule a visit.  Please continue daily skin protection including broad spectrum sunscreen SPF 30+ to sun-exposed areas, reapplying every 2 hours as needed when you're outdoors.   Staying in the shade or wearing long sleeves, sun glasses (UVA+UVB protection) and wide brim hats (4-inch brim around the entire circumference of the hat) are also recommended for sun protection.    Due to recent changes in healthcare laws, you may see results of your pathology and/or   laboratory studies on MyChart before the doctors have had a chance to review them. We understand that in some cases there may be results that are confusing or concerning to you. Please understand that not all results are received at the same time and often the doctors may need to interpret multiple results in order to provide you with the best plan of care or course of treatment. Therefore, we ask that you please give us 2 business days to thoroughly review all your results before contacting the office for clarification. Should we see a critical lab result, you will be contacted  sooner.   If You Need Anything After Your Visit  If you have any questions or concerns for your doctor, please call our main line at [phone] and press option 4 to reach your doctor's medical assistant. If no one answers, please leave a voicemail as directed and we will return your call as soon as possible. Messages left after 4 pm will be answered the following business day.   You may also send us a message via MyChart. We typically respond to MyChart messages within 1-2 business days.  For prescription refills, please ask your pharmacy to contact our office. Our fax number is [phone].  If you have an urgent issue when the clinic is closed that cannot wait until the next business day, you can page your doctor at the number below.    Please note that while we do our best to be available for urgent issues outside of office hours, we are not available 24/7.   If you have an urgent issue and are unable to reach us, you may choose to seek medical care at your doctor's office, retail clinic, urgent care center, or emergency room.  If you have a medical emergency, please immediately call 911 or go to the emergency department.  Pager Numbers  - Dr. Kowalski: [phone]  - Dr. Moye: [phone]  - Dr. Stewart: [phone]  In the event of inclement weather, please call our main line at [phone] for an update on the status of any delays or closures.  Dermatology Medication Tips: Please keep the boxes that topical medications come in in order to help keep track of the instructions about where and how to use these. Pharmacies typically print the medication instructions only on the boxes and not directly on the medication tubes.   If your medication is too expensive, please contact our office at [phone] option 4 or send us a message through MyChart.   We are unable to tell what your co-pay for medications will be in advance as this is different depending on your insurance  coverage. However, we may be able to find a substitute medication at lower cost or fill out paperwork to get insurance to cover a needed medication.   If a prior authorization is required to get your medication covered by your insurance company, please allow us 1-2 business days to complete this process.  Drug prices often vary depending on where the prescription is filled and some pharmacies may offer cheaper prices.  The website www.goodrx.com contains coupons for medications through different pharmacies. The prices here do not account for what the cost may be with help from insurance (it may be cheaper with your insurance), but the website can give you the price if you did not use any insurance.  - You can print the associated coupon and take it with your prescription to the pharmacy.  - You may also stop by our office   during regular business hours and pick up a GoodRx coupon card.  - If you need your prescription sent electronically to a different pharmacy, notify our office through [facility] MyChart or by phone at [phone] option 4.     Si Usted Necesita Algo Despus de Su Visita  Tambin puede enviarnos un mensaje a travs de MyChart. Por lo general respondemos a los mensajes de MyChart en el transcurso de 1 a 2 das hbiles.  Para renovar recetas, por favor pida a su farmacia que se ponga en contacto con nuestra oficina. Nuestro nmero de fax es el [phone].  Si tiene un asunto urgente cuando la clnica est cerrada y que no puede esperar hasta el siguiente da hbil, puede llamar/localizar a su doctor(a) al nmero que aparece a continuacin.   Por favor, tenga en cuenta que aunque hacemos todo lo posible para estar disponibles para asuntos urgentes fuera del horario de oficina, no estamos disponibles las 24 horas del da, los 7 das de la semana.   Si tiene un problema urgente y no puede comunicarse con nosotros, puede optar por buscar atencin mdica  en el consultorio de  su doctor(a), en una clnica privada, en un centro de atencin urgente o en una sala de emergencias.  Si tiene una emergencia mdica, por favor llame inmediatamente al 911 o vaya a la sala de emergencias.  Nmeros de bper  - Dr. Kowalski: [phone]  - Dra. Moye: [phone]  - Dra. Stewart: [phone]  En caso de inclemencias del tiempo, por favor llame a nuestra lnea principal al [phone] para una actualizacin sobre el estado de cualquier retraso o cierre.  Consejos para la medicacin en dermatologa: Por favor, guarde las cajas en las que vienen los medicamentos de uso tpico para ayudarle a seguir las instrucciones sobre dnde y cmo usarlos. Las farmacias generalmente imprimen las instrucciones del medicamento slo en las cajas y no directamente en los tubos del medicamento.   Si su medicamento es muy caro, por favor, pngase en contacto con nuestra oficina llamando al [phone] y presione la opcin 4 o envenos un mensaje a travs de MyChart.   No podemos decirle cul ser su copago por los medicamentos por adelantado ya que esto es diferente dependiendo de la cobertura de su seguro. Sin embargo, es posible que podamos encontrar un medicamento sustituto a menor costo o llenar un formulario para que el seguro cubra el medicamento que se considera necesario.   Si se requiere una autorizacin previa para que su compaa de seguros cubra su medicamento, por favor permtanos de 1 a 2 das hbiles para completar este proceso.  Los precios de los medicamentos varan con frecuencia dependiendo del lugar de dnde se surte la receta y alguna farmacias pueden ofrecer precios ms baratos.  El sitio web www.goodrx.com tiene cupones para medicamentos de diferentes farmacias. Los precios aqu no tienen en cuenta lo que podra costar con la ayuda del seguro (puede ser ms barato con su seguro), pero el sitio web puede darle el precio si no utiliz ningn seguro.  - Puede imprimir el  cupn correspondiente y llevarlo con su receta a la farmacia.  - Tambin puede pasar por nuestra oficina durante el horario de atencin regular y recoger una tarjeta de cupones de GoodRx.  - Si necesita que su receta se enve electrnicamente a una farmacia diferente, informe a nuestra oficina a travs de MyChart de [place] o por telfono llamando al [phone] y presione la opcin 4.  

## 2022-05-11 DIAGNOSIS — G4733 Obstructive sleep apnea (adult) (pediatric): Secondary | ICD-10-CM | POA: Diagnosis not present

## 2022-05-13 ENCOUNTER — Ambulatory Visit
Admission: RE | Admit: 2022-05-13 | Discharge: 2022-05-13 | Disposition: A | Discharge status: Home / Self Care | Payer: 59 | Source: Ambulatory Visit | Attending: Family Medicine | Admitting: Family Medicine

## 2022-05-13 DIAGNOSIS — Z1231 Encounter for screening mammogram for malignant neoplasm of breast: Secondary | ICD-10-CM | POA: Diagnosis not present

## 2022-05-16 NOTE — Progress Notes (Unsigned)
Cardiology Office Note  Date:  05/17/2022   ID:  Hannah Gonzalez, DOB [birth year], MRN [id number]  PCP:  Virginia Crews, MD   Chief Complaint  Patient presents with   Follow-up    6 month f/u.  Doing well today and mediations reviewed verbally.    HPI:  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  OSA on CPAP who presents for evaluation of her tachycardia and hyperlipidemia  LOV September 2023 age>89 Mother fell, surgery, rehab Got covid in rehab, Passed  Now on CPAP, tolerating well Sleep well  Lab work reviewed Total cholesterol 242, LDL 161  Denies tachycardia taking metoprolol succinate 75 in the morning   dermatologic issue,  on methotrexate 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 Faith Rogue    mother has history of A. fib, father with pacemaker, died of a stroke Both parents have very high cholesterol    PMH:   has a past medical history of Arthritis, Basal cell carcinoma (05/04/2001), basal cell carcinoma (05/04/2001), dysplastic nevus (02/23/2009), dysplastic nevus (11/23/2009), Palpitations, Poison ivy  dermatitis (09/21/2020), Sleep apnea, and Vitamin D deficiency.  PSH:    Past Surgical History:  Procedure Laterality Date   ANTERIOR CRUCIATE LIGAMENT REPAIR     ARTHROSCOPIC REPAIR ACL     left   BACK SURGERY     BIOPSY BREAST     left    BREAST BIOPSY Left 2010   benign-core   LAPAROSCOPY     MYOMECTOMY     PAROTIDECTOMY     THYMECTOMY     TONSILLECTOMY     TOTAL HIP ARTHROPLASTY Right 01/10/2018   Procedure: TOTAL HIP ARTHROPLASTY ANTERIOR APPROACH;  Surgeon: Lovell Sheehan, MD;  Location: [facility] ORS;  Service: Orthopedics;  Laterality: Right;    Current Outpatient Medications  Medication Sig Dispense Refill   Cetirizine HCl 10 MG CAPS Take 10 mg by mouth daily.      Cholecalciferol (VITAMIN D3) 1000 units CAPS Take 1,000 Units by mouth daily.      Coenzyme Q10 (COQ10) 200 MG CAPS Take by mouth.     CRANBERRY PO Take 1 capsule by mouth daily.     folic acid (FOLVITE) 1 MG tablet Take 1 tablet (1 mg total) by mouth Three (3) times a day. Take on non-methotrexate days 270 tablet 1   methotrexate (RHEUMATREX) 2.5 MG tablet TAKE 4 TABLETS BY MOUTH ONCE WEEKLY. CAUTION: CHEMOTHERAPY. PROTECT FROM LIGHT 16 tablet 2   metoprolol succinate (TOPROL-XL) 50 MG 24 hr tablet Take 1.5 tablets (75 mg total) by mouth daily. 135 tablet 3   mometasone (NASONEX) 50 MCG/ACT nasal spray 2 sprays daily.     Multiple Vitamins-Minerals (ONE-A-DAY WOMENS 50 PLUS PO) Take 1  tablet by mouth daily.     Probiotic Product (ALIGN PO) Take by mouth.     Psyllium (METAMUCIL PO) Take by mouth.     tolterodine (DETROL LA) 4 MG 24 hr capsule Take 1 capsule (4 mg total) by mouth daily. 90 capsule 3   No current facility-administered medications for this visit.    Allergies:   Aspirin   Social History:  The patient  reports that she has never smoked. She has never used smokeless tobacco. She reports that she does not drink alcohol and does not use drugs.   Family History:   family history includes  Arrhythmia in her mother; Arthritis in her father and mother; Breast cancer in her cousin and maternal aunt; Cancer in her maternal aunt and mother; Heart disease in her mother; Heart failure in her mother; Hyperlipidemia in her brother and mother; Hypertension in her brother, father, and mother; Prostate cancer in her father.   Review of Systems: Review of Systems  Constitutional: Negative.   HENT: Negative.    Respiratory: Negative.    Cardiovascular: Negative.   Gastrointestinal: Negative.   Musculoskeletal: Negative.   Neurological: Negative.   Psychiatric/Behavioral: Negative.    All other systems reviewed and are negative.  PHYSICAL EXAM: VS:  BP 130/80 (BP Location: Left Arm, Patient Position: Sitting)   Pulse 84   Ht 5\' 8"  (1.727 m)   Wt 181 lb 12.8 oz (82.5 kg)   SpO2 96%   BMI 27.64 kg/m  , BMI Body mass index is 27.64 kg/m. Constitutional:  oriented to person, place, and time. No distress.  HENT:  Head: Grossly normal Eyes:  no discharge. No scleral icterus.  Neck: No JVD, no carotid bruits  Cardiovascular: Regular rate and rhythm, no murmurs appreciated Pulmonary/Chest: Clear to auscultation bilaterally, no wheezes or rails Abdominal: Soft.  no distension.  no tenderness.  Musculoskeletal: Normal range of motion Neurological:  normal muscle tone. Coordination normal. No atrophy Skin: Skin warm and dry Psychiatric: normal affect, pleasant  Recent Labs: 04/14/2022: ALT 36; BUN 11; Creatinine, Ser 0.78; Hemoglobin 13.4; Platelets 260; Potassium 4.4; Sodium 143; TSH 3.100    Lipid Panel Lab Results  Component Value Date   CHOL 242 (H) 04/14/2022   HDL 50 04/14/2022   LDLCALC 161 (H) 04/14/2022   TRIG 169 (H) 04/14/2022    Wt Readings from Last 3 Encounters:  05/17/22 181 lb 12.8 oz (82.5 kg)  04/07/22 178 lb 14.4 oz (81.1 kg)  12/30/21 179 lb 11.2 oz (81.5 kg)     ASSESSMENT AND PLAN:  Tachycardia -   symptoms well-controlled on metoprolol succinate 75  daily, refill provided  Mixed hyperlipidemia  No significant coronary calcification on prior CT scans We have ordered calcium scoring to update her imaging For elevated calcium score, could consider cholesterol management Previously preferred not to be on a statin  Essential hypertension -  Blood pressure is well controlled on today's visit. No changes made to the medications.  Stress/adjustment disorder Retired from [employer] Mother died this past week, husband sister also died this past week, 2 funerals this past weekend   Total encounter time more than 30 minutes  Greater than 50% was spent in counseling and coordination of care with the patient    No orders of the defined types were placed in this encounter.    Signed, Esmond Plants, M.D., Ph.D. 05/17/2022  [place], [place]

## 2022-05-17 ENCOUNTER — Encounter: Payer: Self-pay | Admitting: Cardiovascular Disease

## 2022-05-17 ENCOUNTER — Ambulatory Visit: Payer: 59 | Attending: Cardiovascular Disease | Admitting: Cardiovascular Disease

## 2022-05-17 VITALS — BP 130/80 | HR 84 | Ht 68.0 in | Wt 181.8 lb

## 2022-05-17 DIAGNOSIS — G4733 Obstructive sleep apnea (adult) (pediatric): Secondary | ICD-10-CM | POA: Diagnosis not present

## 2022-05-17 DIAGNOSIS — I10 Essential (primary) hypertension: Secondary | ICD-10-CM | POA: Diagnosis not present

## 2022-05-17 DIAGNOSIS — R000 Tachycardia, unspecified: Secondary | ICD-10-CM | POA: Diagnosis not present

## 2022-05-17 DIAGNOSIS — E782 Mixed hyperlipidemia: Secondary | ICD-10-CM | POA: Diagnosis not present

## 2022-05-17 MED ORDER — METOPROLOL SUCCINATE ER 50 MG PO TB24: 75.0000 mg | ORAL_TABLET | Freq: Every day | ORAL | 3 refills | Status: DC

## 2022-05-17 NOTE — Patient Instructions (Addendum)
Medication Instructions:  No changes  If you need a refill on your cardiac medications before your next appointment, please call your pharmacy.   Lab work: No new labs needed  Testing/Procedures: CT coronary calcium score for hyperlipidemia We will order CT coronary calcium score $99 at our [facility] in [place]  Please call [place] @ [phone] to schedule  [address], [place] [address]   Follow-Up: At [facility], you and your health needs are our priority.  As part of our continuing mission to provide you with exceptional heart care, we have created designated Provider Care Teams.  These Care Teams include your primary Cardiologist (physician) and Advanced Practice Providers (APPs -  Physician Assistants and Nurse Practitioners) who all work together to provide you with the care you need, when you need it.  You will need a follow up appointment in 12 months  Providers on your designated Care Team:   Murray Hodgkins, NP Christell Faith, PA-C Cadence Kathlen Mody, [place]  COVID-19 Vaccine Information can be found at: [email or web address] For questions related to vaccine distribution or appointments, please email [email or web address] or call [phone].

## 2022-05-20 ENCOUNTER — Ambulatory Visit
Admission: RE | Admit: 2022-05-20 | Discharge: 2022-05-20 | Disposition: A | Discharge status: Home / Self Care | Payer: 59 | Source: Ambulatory Visit | Attending: Cardiovascular Disease | Admitting: Cardiovascular Disease

## 2022-05-20 DIAGNOSIS — E782 Mixed hyperlipidemia: Secondary | ICD-10-CM | POA: Insufficient documentation

## 2022-06-11 DIAGNOSIS — G4733 Obstructive sleep apnea (adult) (pediatric): Secondary | ICD-10-CM | POA: Diagnosis not present

## 2022-06-15 DIAGNOSIS — H903 Sensorineural hearing loss, bilateral: Secondary | ICD-10-CM | POA: Diagnosis not present

## 2022-06-15 DIAGNOSIS — H90A11 Conductive hearing loss, unilateral, right ear with restricted hearing on the contralateral side: Secondary | ICD-10-CM | POA: Diagnosis not present

## 2022-06-15 DIAGNOSIS — H6983 Other specified disorders of Eustachian tube, bilateral: Secondary | ICD-10-CM | POA: Diagnosis not present

## 2022-06-17 DIAGNOSIS — G4733 Obstructive sleep apnea (adult) (pediatric): Secondary | ICD-10-CM | POA: Diagnosis not present

## 2022-07-11 DIAGNOSIS — G4733 Obstructive sleep apnea (adult) (pediatric): Secondary | ICD-10-CM | POA: Diagnosis not present

## 2022-07-17 DIAGNOSIS — G4733 Obstructive sleep apnea (adult) (pediatric): Secondary | ICD-10-CM | POA: Diagnosis not present

## 2022-07-19 DIAGNOSIS — H6983 Other specified disorders of Eustachian tube, bilateral: Secondary | ICD-10-CM | POA: Diagnosis not present

## 2022-08-11 DIAGNOSIS — G4733 Obstructive sleep apnea (adult) (pediatric): Secondary | ICD-10-CM | POA: Diagnosis not present

## 2022-08-17 DIAGNOSIS — G4733 Obstructive sleep apnea (adult) (pediatric): Secondary | ICD-10-CM | POA: Diagnosis not present

## 2022-08-19 ENCOUNTER — Other Ambulatory Visit: Payer: Self-pay

## 2022-08-19 DIAGNOSIS — R109 Unspecified abdominal pain: Secondary | ICD-10-CM | POA: Diagnosis not present

## 2022-08-19 DIAGNOSIS — R935 Abnormal findings on diagnostic imaging of other abdominal regions, including retroperitoneum: Secondary | ICD-10-CM | POA: Diagnosis not present

## 2022-08-19 DIAGNOSIS — N200 Calculus of kidney: Secondary | ICD-10-CM | POA: Diagnosis not present

## 2022-08-19 LAB — CBC
HCT: 41.7 % (ref 36.0–46.0)
Hemoglobin: 14 g/dL (ref 12.0–15.0)
MCH: 31 pg (ref 26.0–34.0)
MCHC: 33.6 g/dL (ref 30.0–36.0)
MCV: 92.5 fL (ref 80.0–100.0)
Platelets: 270 10*3/uL (ref 150–400)
RBC: 4.51 MIL/uL (ref 3.87–5.11)
RDW: 13.3 % (ref 11.5–15.5)
WBC: 9 10*3/uL (ref 4.0–10.5)
nRBC: 0 % (ref 0.0–0.2)

## 2022-08-19 LAB — URINALYSIS, ROUTINE W REFLEX MICROSCOPIC
Bilirubin Urine: NEGATIVE
Glucose, UA: NEGATIVE mg/dL
Hgb urine dipstick: NEGATIVE
Ketones, ur: NEGATIVE mg/dL
Leukocytes,Ua: NEGATIVE
Nitrite: NEGATIVE
Protein, ur: NEGATIVE mg/dL
Specific Gravity, Urine: 1.02 (ref 1.005–1.030)
pH: 7 (ref 5.0–8.0)

## 2022-08-19 LAB — BASIC METABOLIC PANEL
Anion gap: 10 (ref 5–15)
BUN: 21 mg/dL (ref 8–23)
CO2: 23 mmol/L (ref 22–32)
Calcium: 9.2 mg/dL (ref 8.9–10.3)
Chloride: 107 mmol/L (ref 98–111)
Creatinine, Ser: 1.11 mg/dL — ABNORMAL HIGH (ref 0.44–1.00)
GFR, Estimated: 56 mL/min — ABNORMAL LOW (ref >60)
Glucose, Bld: 146 mg/dL — ABNORMAL HIGH (ref 70–99)
Potassium: 4 mmol/L (ref 3.5–5.1)
Sodium: 140 mmol/L (ref 135–145)

## 2022-08-19 NOTE — ED Triage Notes (Signed)
Pt to ed from home via POV for right sided flank pain that moves into her groin. Pt denies burning when she urinates but has increased frequency. Pt is caox4, in no acute distress and ambulatory in triage/

## 2022-08-20 ENCOUNTER — Emergency Department: Payer: 59

## 2022-08-20 ENCOUNTER — Emergency Department
Admission: EM | Admit: 2022-08-20 | Discharge: 2022-08-20 | Disposition: A | Discharge status: Home / Self Care | Payer: 59 | Attending: Emergency Medicine | Admitting: Emergency Medicine

## 2022-08-20 DIAGNOSIS — R935 Abnormal findings on diagnostic imaging of other abdominal regions, including retroperitoneum: Secondary | ICD-10-CM | POA: Diagnosis not present

## 2022-08-20 DIAGNOSIS — R109 Unspecified abdominal pain: Secondary | ICD-10-CM | POA: Diagnosis not present

## 2022-08-20 DIAGNOSIS — N200 Calculus of kidney: Secondary | ICD-10-CM

## 2022-08-20 MED ORDER — ONDANSETRON 4 MG PO TBDP: 4.0000 mg | ORAL_TABLET | Freq: Three times a day (TID) | ORAL | 0 refills | Status: DC | PRN

## 2022-08-20 NOTE — ED Provider Notes (Signed)
[facility] Provider Note    Event Date/Time   First MD Initiated Contact with Patient 08/20/22 0112     (approximate)   History   Flank Pain (Right sided - 6pm)   HPI Hannah Gonzalez is a 62 y.o. female who presents for evaluation of acute onset of sharp right-sided flank pain that radiates down into her right lower abdomen and into her groin.  It started around 6 PM suddenly and without warning.  No dysuria, no hematuria, but she felt like she was not completely emptying her bladder despite having increased urinary frequency and urgency.  She became nauseated but had no vomiting.  She said the pain was severe for about 7 hours and so she decided come to the emergency department.  After she got here she finally got to a position of comfort and now her pain is almost completely gone.  She says she feels a little bit sore but otherwise feels normal.  She has no personal history of kidney stones but has family members who have had stones including her father who required a ureteral stent at 1 point.     Physical Exam   Triage Vital Signs: ED Triage Vitals  Enc Vitals Group     BP 08/19/22 2301 (!) 184/84     Pulse Rate 08/19/22 2301 84     Resp 08/19/22 2301 16     Temp 08/19/22 2301 98 F (36.7 C)     Temp Source 08/19/22 2301 Oral     SpO2 08/19/22 2301 98 %     Weight --      Height 08/19/22 2302 1.727 m (5\' 8" )     Head Circumference --      Peak Flow --      Pain Score 08/19/22 2302 10     Pain Loc --      Pain Edu? --      Excl. in GC? --     Most recent vital signs: Vitals:   08/19/22 2301  BP: (!) 184/84  Pulse: 84  Resp: 16  Temp: 98 F (36.7 C)  SpO2: 98%    General: Awake, no distress.  CV:  Good peripheral perfusion.  Resp:  Normal effort. Speaking easily and comfortably, no accessory muscle usage nor intercostal retractions.   Abd:  No distention.  No tenderness to palpation. Other:  No right flank tenderness to  percussion   ED Results / Procedures / Treatments   Labs (all labs ordered are listed, but only abnormal results are displayed) Labs Reviewed  URINALYSIS, ROUTINE W REFLEX MICROSCOPIC - Abnormal; Notable for the following components:      Result Value   Color, Urine YELLOW (*)    APPearance CLOUDY (*)    All other components within normal limits  BASIC METABOLIC PANEL - Abnormal; Notable for the following components:   Glucose, Bld 146 (*)    Creatinine, Ser 1.11 (*)    GFR, Estimated 56 (*)    All other components within normal limits  CBC     RADIOLOGY I viewed and interpreted the patient's CT of the abdomen and pelvis.  No ureteral stone but there is some right-sided hydronephrosis.  There is a stone in her bladder.  Radiologist commented that it appears that a stone is recently passed on the right side and there is a 3 mm bladder calculus.   PROCEDURES:  Critical Care performed: No  Procedures    IMPRESSION / MDM /  ASSESSMENT AND PLAN / ED COURSE  I reviewed the triage vital signs and the nursing notes.                              Differential diagnosis includes, but is not limited to, kidney stone/renal colic, ureteral stone, UTI/pyelo, appendicitis, intra-abdominal abscess.  Patient's presentation is most consistent with acute presentation with potential threat to life or bodily function.  Labs/studies ordered: BMP, urinalysis, CBC, CT renal stone study  Interventions/Medications given:  Medications - No data to display  (Note:  hospital course my include additional interventions and/or labs/studies not listed above.)   Patient is currently asymptomatic.  Triage vitals are notable for some hypertension but this was likely situational and her pain is completely resolved.  Urinalysis is normal.  CBC is normal.  BMP shows a slight elevation of her creatinine, but the patient says she just got back from a hiking trip with her husband and she is likely having  some side effects of that.  It is much less likely that she is having some acute kidney injury as a result of a renal stone.  CT scan shows a 3 mm bladder calculus and her symptoms are consistent with having recently passed a stone.  She is in no distress.  She declined a Toradol injection.  I offered urology follow-up but she and I both agree it should be fine for her to follow-up with her PCP.  I gave my usual and customary return precautions and a prescription for Zofran should she continue to have some nausea.         FINAL CLINICAL IMPRESSION(S) / ED DIAGNOSES   Final diagnoses:  Right flank pain  Kidney stone     Rx / DC Orders   ED Discharge Orders          Ordered    ondansetron (ZOFRAN-ODT) 4 MG disintegrating tablet  Every 8 hours PRN        08/20/22 0241             Note:  This document was prepared using Dragon voice recognition software and may include unintentional dictation errors.   Loleta Rose, MD 08/20/22 [phone]

## 2022-08-20 NOTE — Discharge Instructions (Signed)
As we discussed, you passed a 3 mm stone into your bladder.  The hard part is done!  The stones usually come out from the bladder without too much difficulty.  Please take over-the-counter ibuprofen and/or Tylenol according to label instructions for your discomfort.  We also wrote your prescription for nausea medicine in case you continue to have any nausea or vomiting.  Remember to drink plenty of fluids; your kidney function is not quite as good as normal, but this is more likely to be the result of your recent hiking trip then it is due to the kidney stone.  Follow-up with your regular doctor as needed.  The 2 of you can decide if you would benefit from outpatient follow-up with urology.    Return to the emergency department if you develop new or worsening symptoms that concern you.

## 2022-08-30 ENCOUNTER — Other Ambulatory Visit: Payer: Self-pay | Admitting: Dermatology

## 2022-08-30 DIAGNOSIS — M793 Panniculitis, unspecified: Secondary | ICD-10-CM

## 2022-08-31 ENCOUNTER — Ambulatory Visit: Payer: 59 | Admitting: Dermatology

## 2022-08-31 VITALS — BP 132/87

## 2022-08-31 DIAGNOSIS — Z79899 Other long term (current) drug therapy: Secondary | ICD-10-CM | POA: Diagnosis not present

## 2022-08-31 DIAGNOSIS — Z7189 Other specified counseling: Secondary | ICD-10-CM

## 2022-08-31 DIAGNOSIS — M793 Panniculitis, unspecified: Secondary | ICD-10-CM

## 2022-08-31 NOTE — Patient Instructions (Signed)
Due to recent changes in healthcare laws, you may see results of your pathology and/or laboratory studies on MyChart before the doctors have had a chance to review them. We understand that in some cases there may be results that are confusing or concerning to you. Please understand that not all results are received at the same time and often the doctors may need to interpret multiple results in order to provide you with the best plan of care or course of treatment. Therefore, we ask that you please give us 2 business days to thoroughly review all your results before contacting the office for clarification. Should we see a critical lab result, you will be contacted sooner.   If You Need Anything After Your Visit  If you have any questions or concerns for your doctor, please call our main line at [phone] and press option 4 to reach your doctor's medical assistant. If no one answers, please leave a voicemail as directed and we will return your call as soon as possible. Messages left after 4 pm will be answered the following business day.   You may also send us a message via MyChart. We typically respond to MyChart messages within 1-2 business days.  For prescription refills, please ask your pharmacy to contact our office. Our fax number is [phone].  If you have an urgent issue when the clinic is closed that cannot wait until the next business day, you can page your doctor at the number below.    Please note that while we do our best to be available for urgent issues outside of office hours, we are not available 24/7.   If you have an urgent issue and are unable to reach us, you may choose to seek medical care at your doctor's office, retail clinic, urgent care center, or emergency room.  If you have a medical emergency, please immediately call 911 or go to the emergency department.  Pager Numbers  - Dr. Kowalski: [phone]  - Dr. Moye: [phone]  - Dr. Stewart:  [phone]  In the event of inclement weather, please call our main line at [phone] for an update on the status of any delays or closures.  Dermatology Medication Tips: Please keep the boxes that topical medications come in in order to help keep track of the instructions about where and how to use these. Pharmacies typically print the medication instructions only on the boxes and not directly on the medication tubes.   If your medication is too expensive, please contact our office at [phone] option 4 or send us a message through MyChart.   We are unable to tell what your co-pay for medications will be in advance as this is different depending on your insurance coverage. However, we may be able to find a substitute medication at lower cost or fill out paperwork to get insurance to cover a needed medication.   If a prior authorization is required to get your medication covered by your insurance company, please allow us 1-2 business days to complete this process.  Drug prices often vary depending on where the prescription is filled and some pharmacies may offer cheaper prices.  The website www.goodrx.com contains coupons for medications through different pharmacies. The prices here do not account for what the cost may be with help from insurance (it may be cheaper with your insurance), but the website can give you the price if you did not use any insurance.  - You can print the associated coupon and take it with   your prescription to the pharmacy.  - You may also stop by our office during regular business hours and pick up a GoodRx coupon card.  - If you need your prescription sent electronically to a different pharmacy, notify our office through [facility] MyChart or by phone at [phone] option 4.     Si Usted Necesita Algo Despus de Su Visita  Tambin puede enviarnos un mensaje a travs de MyChart. Por lo general respondemos a los mensajes de MyChart en el transcurso de 1 a 2  das hbiles.  Para renovar recetas, por favor pida a su farmacia que se ponga en contacto con nuestra oficina. Nuestro nmero de fax es el [phone].  Si tiene un asunto urgente cuando la clnica est cerrada y que no puede esperar hasta el siguiente da hbil, puede llamar/localizar a su doctor(a) al nmero que aparece a continuacin.   Por favor, tenga en cuenta que aunque hacemos todo lo posible para estar disponibles para asuntos urgentes fuera del horario de oficina, no estamos disponibles las 24 horas del da, los 7 das de la semana.   Si tiene un problema urgente y no puede comunicarse con nosotros, puede optar por buscar atencin mdica  en el consultorio de su doctor(a), en una clnica privada, en un centro de atencin urgente o en una sala de emergencias.  Si tiene una emergencia mdica, por favor llame inmediatamente al 911 o vaya a la sala de emergencias.  Nmeros de bper  - Dr. Kowalski: [phone]  - Dra. Moye: [phone]  - Dra. Stewart: [phone]  En caso de inclemencias del tiempo, por favor llame a nuestra lnea principal al [phone] para una actualizacin sobre el estado de cualquier retraso o cierre.  Consejos para la medicacin en dermatologa: Por favor, guarde las cajas en las que vienen los medicamentos de uso tpico para ayudarle a seguir las instrucciones sobre dnde y cmo usarlos. Las farmacias generalmente imprimen las instrucciones del medicamento slo en las cajas y no directamente en los tubos del medicamento.   Si su medicamento es muy caro, por favor, pngase en contacto con nuestra oficina llamando al [phone] y presione la opcin 4 o envenos un mensaje a travs de MyChart.   No podemos decirle cul ser su copago por los medicamentos por adelantado ya que esto es diferente dependiendo de la cobertura de su seguro. Sin embargo, es posible que podamos encontrar un medicamento sustituto a menor costo o llenar un formulario para que el  seguro cubra el medicamento que se considera necesario.   Si se requiere una autorizacin previa para que su compaa de seguros cubra su medicamento, por favor permtanos de 1 a 2 das hbiles para completar este proceso.  Los precios de los medicamentos varan con frecuencia dependiendo del lugar de dnde se surte la receta y alguna farmacias pueden ofrecer precios ms baratos.  El sitio web www.goodrx.com tiene cupones para medicamentos de diferentes farmacias. Los precios aqu no tienen en cuenta lo que podra costar con la ayuda del seguro (puede ser ms barato con su seguro), pero el sitio web puede darle el precio si no utiliz ningn seguro.  - Puede imprimir el cupn correspondiente y llevarlo con su receta a la farmacia.  - Tambin puede pasar por nuestra oficina durante el horario de atencin regular y recoger una tarjeta de cupones de GoodRx.  - Si necesita que su receta se enve electrnicamente a una farmacia diferente, informe a nuestra oficina a travs de MyChart de [place]   o por telfono llamando al [phone] y presione la opcin 4.  

## 2022-08-31 NOTE — Progress Notes (Signed)
   Follow-Up Visit   Subjective  Hannah Gonzalez is a 62 y.o. female who presents for the following: Lipodermatosclerosis of left lower leg - methotrexate 10 mg qweek, Folic acid 1 mg - no change but not worse   The following portions of the chart were reviewed this encounter and updated as appropriate: medications, allergies, medical history  Review of Systems:  No other skin or systemic complaints except as noted in HPI or Assessment and Plan.  Objective  Well appearing patient in no apparent distress; mood and affect are within normal limits.  A focused examination was performed of the following areas: left lower leg  Relevant exam findings are noted in the Assessment and Plan.     Assessment & Plan   Lipodermatosclerosis of left lower extremity Left lower leg  Indurated and violaceous plaques of left lower leg - inferior 6.5 x 4.5 cm, superior anterior 3.5 x 1.5 cm, superior posterior 3.0 x 1.5 cm    Chronic and persistent condition with duration or expected duration over one year. Condition is symptomatic / bothersome to patient. Not to goal.   Has improved with smaller plaques today compared to last visit and not as tender. Reviewed labs 03/2022 liver, kidney, CBC normal   Pt feels her condition is improved, although she does have fatigue symptoms with MTX, but wants to continue for now. Symptoms of pain and sensitivity and erythema almost completely resolved. Induration and dyschromia persistent.   - Continue methotrexate 10mg  weekly and folic acid 3mg  on non-MTX days - Continue with compression stockings    Long term medication management.  Patient is using long term (months to years) prescription medication  to control their dermatologic condition.  These medications require periodic monitoring to evaluate for efficacy and side effects and may require periodic laboratory monitoring.    Lipodermatosclerosis  Related Procedures Hepatic Function  Panel    Return today (on 08/31/2022) for Follow up as scheduled.  I, Joanie Coddington, CMA, am acting as scribe for Armida Sans, MD .   Documentation: I have reviewed the above documentation for accuracy and completeness, and I agree with the above.  Armida Sans, MD

## 2022-09-02 ENCOUNTER — Encounter: Payer: Self-pay | Admitting: Dermatology

## 2022-09-10 DIAGNOSIS — G4733 Obstructive sleep apnea (adult) (pediatric): Secondary | ICD-10-CM | POA: Diagnosis not present

## 2022-09-15 DIAGNOSIS — G4733 Obstructive sleep apnea (adult) (pediatric): Secondary | ICD-10-CM | POA: Diagnosis not present

## 2022-10-03 ENCOUNTER — Encounter: Payer: Self-pay | Admitting: Family Medicine

## 2022-10-03 ENCOUNTER — Ambulatory Visit (INDEPENDENT_AMBULATORY_CARE_PROVIDER_SITE_OTHER): Payer: 59 | Admitting: Family Medicine

## 2022-10-03 VITALS — BP 129/75 | HR 78 | Temp 98.0°F | Resp 16 | Ht 68.0 in | Wt 182.9 lb

## 2022-10-03 DIAGNOSIS — R300 Dysuria: Secondary | ICD-10-CM | POA: Diagnosis not present

## 2022-10-03 DIAGNOSIS — N3090 Cystitis, unspecified without hematuria: Secondary | ICD-10-CM | POA: Diagnosis not present

## 2022-10-03 LAB — POCT URINALYSIS DIPSTICK
Bilirubin, UA: NEGATIVE
Glucose, UA: NEGATIVE
Ketones, UA: NEGATIVE
Nitrite, UA: NEGATIVE
Protein, UA: POSITIVE — AB
Spec Grav, UA: 1.025 (ref 1.010–1.025)
Urobilinogen, UA: 0.2 E.U./dL
pH, UA: 6 (ref 5.0–8.0)

## 2022-10-03 MED ORDER — CEPHALEXIN 500 MG PO CAPS: 500.0000 mg | ORAL_CAPSULE | Freq: Three times a day (TID) | ORAL | 0 refills | Status: AC

## 2022-10-03 NOTE — Progress Notes (Signed)
Acute Office Visit  Subjective:     Patient ID: Hannah Gonzalez, female    DOB: 02/20/1961, 62 y.o.   MRN: [id number]  Chief Complaint  Patient presents with   Urinary Tract Infection    HPI Discussed the use of AI scribe software for clinical note transcription with the patient, who gave verbal consent to proceed.  History of Present Illness   The patient, with a history of a recent kidney stone, presents with symptoms of a urinary tract infection (UTI) that started last Friday. They describe a burning sensation that has evolved into a general irritated feeling, akin to pins and needles. They also report feeling generally unwell, with a sense of being run down. Six weeks ago, they had a kidney stone that was still in the bladder when they left the ER. They are unsure if the stone has passed as they did not strain their urine to look for it. The patient also mentions the financial burden of their ER visit due to insurance deductible issues.       ROS      Objective:    BP 129/75 (BP Location: Right Arm, Patient Position: Sitting, Cuff Size: Large)   Pulse 78   Temp 98 F (36.7 C) (Temporal)   Resp 16   Ht 5\' 8"  (1.727 m)   Wt 182 lb 14.4 oz (83 kg)   SpO2 97%   BMI 27.81 kg/m    Physical Exam Vitals reviewed.  Constitutional:      General: She is not in acute distress.    Appearance: She is well-developed.  HENT:     Head: Normocephalic and atraumatic.  Eyes:     General: No scleral icterus.    Conjunctiva/sclera: Conjunctivae normal.  Cardiovascular:     Rate and Rhythm: Normal rate and regular rhythm.     Heart sounds: Normal heart sounds. No murmur heard. Pulmonary:     Effort: Pulmonary effort is normal. No respiratory distress.     Breath sounds: Normal breath sounds. No wheezing or rales.  Abdominal:     General: There is no distension.     Palpations: Abdomen is soft.     Tenderness: There is no abdominal tenderness. There is no guarding or  rebound.  Skin:    General: Skin is warm and dry.     Capillary Refill: Capillary refill takes less than 2 seconds.     Findings: No rash.  Neurological:     Mental Status: She is alert and oriented to person, place, and time.  Psychiatric:        Behavior: Behavior normal.     Results for orders placed or performed in visit on 10/03/22  POCT urinalysis dipstick  Result Value Ref Range   Color, UA yellow    Clarity, UA clear    Glucose, UA Negative Negative   Bilirubin, UA Negative    Ketones, UA Negative    Spec Grav, UA 1.025 1.010 - 1.025   Blood, UA small    pH, UA 6.0 5.0 - 8.0   Protein, UA Positive (A) Negative   Urobilinogen, UA 0.2 0.2 or 1.0 E.U./dL   Nitrite, UA Negative    Leukocytes, UA Moderate (2+) (A) Negative   Appearance     Odor          Assessment & Plan:   Problem List Items Addressed This Visit   None Visit Diagnoses     Cystitis    -  Primary  Relevant Orders   POCT urinalysis dipstick (Completed)   Urine Culture           Urinary Tract Infection (UTI) Symptoms of burning and general irritation since last Friday. Urine sample today suggestive of infection. Patient reports feeling unwell, which is consistent with systemic effects of UTI. -Start Keflex 500mg  TID for 5 days. -Send urine for culture and sensitivity to confirm diagnosis and guide future treatment.  History of Kidney Stone Patient reports a kidney stone 6 weeks ago, which was seen on CT scan to have likely passed into the bladder. No current symptoms suggestive of kidney stone. -No further imaging required at this time. -Advise patient to report any symptoms suggestive of kidney stone for further evaluation.  Follow-up Patient to report any ongoing symptoms or concerns. If no concerns, next appointment is the annual physical already scheduled for next year.        Meds ordered this encounter  Medications   cephALEXin (KEFLEX) 500 MG capsule    Sig: Take 1 capsule  (500 mg total) by mouth 3 (three) times daily for 5 days.    Dispense:  15 capsule    Refill:  0    Return if symptoms worsen or fail to improve.  Shirlee Latch, MD

## 2022-10-11 DIAGNOSIS — G4733 Obstructive sleep apnea (adult) (pediatric): Secondary | ICD-10-CM | POA: Diagnosis not present

## 2022-10-16 DIAGNOSIS — G4733 Obstructive sleep apnea (adult) (pediatric): Secondary | ICD-10-CM | POA: Diagnosis not present

## 2022-10-31 ENCOUNTER — Ambulatory Visit: Payer: 59 | Admitting: Podiatry

## 2022-10-31 ENCOUNTER — Ambulatory Visit (INDEPENDENT_AMBULATORY_CARE_PROVIDER_SITE_OTHER): Payer: 59

## 2022-10-31 ENCOUNTER — Encounter: Payer: Self-pay | Admitting: Podiatry

## 2022-10-31 ENCOUNTER — Ambulatory Visit: Payer: Self-pay

## 2022-10-31 DIAGNOSIS — L819 Disorder of pigmentation, unspecified: Secondary | ICD-10-CM

## 2022-10-31 DIAGNOSIS — G5791 Unspecified mononeuropathy of right lower limb: Secondary | ICD-10-CM

## 2022-10-31 DIAGNOSIS — M2011 Hallux valgus (acquired), right foot: Secondary | ICD-10-CM

## 2022-10-31 DIAGNOSIS — M778 Other enthesopathies, not elsewhere classified: Secondary | ICD-10-CM | POA: Diagnosis not present

## 2022-10-31 NOTE — Telephone Encounter (Signed)
  Chief Complaint: Lump just outside of vagina - pink on tissue after morning urination Symptoms: above Frequency: 2 days Pertinent Negatives: Patient denies fever Disposition: [] ED /[] Urgent Care (no appt availability in office) / [x] Appointment(In office/virtual)/ []  [facility]/ [] Home Care/ [] Refused Recommended Disposition /[] [facility]/ []  Follow-up with PCP Additional Notes: Pt recently had a UTI. She thinks she can feel a lump just outside her vagina. Pt states it is uncomfortable when she wears pants. There is a hint of pink on tissue after 1st morning void.     Reason for Disposition  Tender lump (swelling or "ball") at vaginal opening  Answer Assessment - Initial Assessment Questions 1. SYMPTOM: "What's the main symptom you're concerned about?" (e.g., pain, itching, dryness)     Soreness, lump  2. LOCATION: "Where is the   located?" (e.g., inside/outside, left/right)     Just outside of vagina 3. ONSET: "When did the    start?"     Past 2 days 4. PAIN: "Is there any pain?" If Yes, ask: "How bad is it?" (Scale: 1-10; mild, moderate, severe)   -  MILD (1-3): Doesn't interfere with normal activities.    -  MODERATE (4-7): Interferes with normal activities (e.g., work or school) or awakens from sleep.     -  SEVERE (8-10): Excruciating pain, unable to do any normal activities.     When wearing pants - and when she wipes 6. CAUSE: "What do you think is causing the discharge?" "Have you had the same problem before? What happened then?"     Cyst 7. OTHER SYMPTOMS: "Do you have any other symptoms?" (e.g., fever, itching, vaginal bleeding, pain with urination, injury to genital area, vaginal foreign body)     No other discharge  Protocols used: Vaginal Symptoms-A-AH

## 2022-10-31 NOTE — Progress Notes (Signed)
Subjective:  Patient ID: Hannah Gonzalez, female    DOB: May 24, 1960,  MRN: [id number] HPI Chief Complaint  Patient presents with   Foot Pain    Right - foot is "uncomfortable" all the time x years, numbness, history of back surgery in 2018 and noticed feeling starting to go after that time, bunion deformity, callused area plantar forefoot, used to see Dr. Orland Jarred, can't wear but a few pairs of shoes that are comfortable, uses toe box guards because she noticed she was always getting holes in the tops of shoes   New Patient (Initial Visit)    62 y.o. female presents with the above complaint.   ROS: Denies fever chills nausea vomit muscle aches pains calf pain back pain chest pain shortness of breath.  Past Medical History:  Diagnosis Date   Arthritis    Basal cell carcinoma 05/04/2001   Basal cell left thigh   Hx of basal cell carcinoma 05/04/2001   L sup. ant. 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 , Disp: , Rfl:    Cholecalciferol (VITAMIN D3) 1000 units CAPS, Take 1,000 Units by mouth daily. , Disp: , Rfl:    Coenzyme Q10 (COQ10) 200 MG CAPS, Take by mouth., Disp: , Rfl:    folic acid (FOLVITE) 1 MG tablet,  Take 1 tablet (1 mg total) by mouth Three (3) times a day. Take on non-methotrexate days, Disp: 270 tablet, Rfl: 1   methotrexate (RHEUMATREX) 2.5 MG tablet, TAKE 4 TABLETS BY MOUTH ONCE WEEKLY. PROTECT FROM LIGHT, Disp: 16 tablet, Rfl: 2   metoprolol succinate (TOPROL-XL) 50 MG 24 hr tablet, Take 1.5 tablets (75 mg total) by mouth daily., Disp: 135 tablet, Rfl: 3   Multiple Vitamins-Minerals (ONE-A-DAY WOMENS 50 PLUS PO), Take 1 tablet by mouth daily., Disp: , Rfl:    Probiotic Product (ALIGN PO), Take by mouth., Disp: , Rfl:    Psyllium (METAMUCIL PO), Take by mouth., Disp: , Rfl:    tolterodine (DETROL LA) 4 MG 24 hr capsule, Take 1 capsule (4 mg total) by mouth daily., Disp: 90 capsule, Rfl: 3  Allergies  Allergen Reactions   Aspirin Hives, Rash, Anaphylaxis and Itching   Review of Systems Objective:  There were no vitals filed for this visit.  General: Well developed, nourished, in no acute distress, alert and oriented x3   Dermatological: Skin is warm, dry and supple bilateral. Nails x 10 are well maintained; remaining integument appears unremarkable at this time. There are no open sores, no preulcerative lesions, no  rash or signs of infection present.  Rash to the medial ankle left diagnosed as a adipose sclerosis previously with 2 biopsies.  It appears to be hemosiderin type deposition with hyperpigmentation covering a large area of her medial ankle left.  It is nontender.  Left foot is not problematic.  Vascular: Dorsalis Pedis artery and Posterior Tibial artery pedal pulses are 2/4 bilateral with immedate capillary fill time. Pedal hair growth present. No varicosities and no lower extremity edema present bilateral.   Neruologic: She has loss of sensation to the entire right foot which runs up the lateral aspect of the leg consistent with peroneal neuropathy.  She does have a history of hip replacement and back problems on that side.  Left foot is completely normal normal's muscle  strength all that is grossly intact however the right side does demonstrate slight weakness.  Decreased sensorium per Semmes Weinstein monofilament no vibratory sensation right.  Musculoskeletal: No gross boney pedal deformities bilateral. No pain, crepitus, or limitation noted with foot and ankle range of motion bilateral. Muscular strength 5/5 in all groups tested bilateral.  Hallux abductovalgus deformity right foot.  Gait: Unassisted, Nonantalgic.    Radiographs:  Radiographs taken today of the right foot demonstrate an osseously mature foot with good bone mineralization at this point I do not see any signs of fracture however I do see an increase in the first intermetatarsal angle consistent with hallux valgus deformity.  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  She also has discoloration of the left ankle for which she is currently taking methotrexate and would like to be able to come off the methotrexate so that she could take other anti-inflammatories or medications for her pain.  Plan: Discussed etiology pathology conservative surgical therapies I did discuss surgery for the bunion deformity however I would like to make sure that the neurologic issues that we have on that right side are him evaluated and treated by neurology.  Left foot we are referring her to Dr. Isa Rankin for that discoloration and hyperpigmentation.  Hopefully she will be able to get off of the methotrexate.     Hannah Gonzalez, [place]

## 2022-11-01 ENCOUNTER — Ambulatory Visit (INDEPENDENT_AMBULATORY_CARE_PROVIDER_SITE_OTHER): Payer: 59 | Admitting: Family Medicine

## 2022-11-01 ENCOUNTER — Ambulatory Visit: Payer: 59 | Admitting: Family Medicine

## 2022-11-01 VITALS — BP 135/69 | HR 84 | Ht 68.0 in | Wt 184.8 lb

## 2022-11-01 DIAGNOSIS — N390 Urinary tract infection, site not specified: Secondary | ICD-10-CM | POA: Insufficient documentation

## 2022-11-01 DIAGNOSIS — N764 Abscess of vulva: Secondary | ICD-10-CM | POA: Diagnosis not present

## 2022-11-01 DIAGNOSIS — B952 Enterococcus as the cause of diseases classified elsewhere: Secondary | ICD-10-CM | POA: Insufficient documentation

## 2022-11-01 DIAGNOSIS — R000 Tachycardia, unspecified: Secondary | ICD-10-CM | POA: Diagnosis not present

## 2022-11-01 DIAGNOSIS — R319 Hematuria, unspecified: Secondary | ICD-10-CM | POA: Diagnosis not present

## 2022-11-01 LAB — POCT URINALYSIS DIPSTICK
Bilirubin, UA: NEGATIVE
Blood, UA: NEGATIVE
Glucose, UA: NEGATIVE
Ketones, UA: NEGATIVE
Leukocytes, UA: NEGATIVE
Nitrite, UA: NEGATIVE
Protein, UA: NEGATIVE
Spec Grav, UA: 1.015 (ref 1.010–1.025)
Urobilinogen, UA: 0.2 U/dL
pH, UA: 6 (ref 5.0–8.0)

## 2022-11-01 MED ORDER — DOXYCYCLINE HYCLATE 100 MG PO TABS: 100.0000 mg | ORAL_TABLET | Freq: Two times a day (BID) | ORAL | 0 refills | Status: DC

## 2022-11-01 MED ORDER — AMOXICILLIN-POT CLAVULANATE 875-125 MG PO TABS: 1.0000 | ORAL_TABLET | Freq: Two times a day (BID) | ORAL | 0 refills | Status: DC

## 2022-11-01 NOTE — Progress Notes (Signed)
Established patient visit   Patient: Hannah Gonzalez   DOB: 1960-08-15   62 y.o. Female  MRN: [id number] Visit Date: 11/01/2022  Today's healthcare provider: Sherlyn Hay, DO   No chief complaint on file.  Subjective    HPI Patient is present due to sore in private area/blood w/urination. Only noticed blood Saturday and Sunday morning and has now stopped, only noticed when wiping. Reports fresh and bright red. Reminds her of the color when you pop a pimple. Patient recently treat for UTI and soreness was present then as well. She completed treatment on 8/17 or 8/18   - feels like salt in a papercut  - sting when she pees or takes a shower and gets soap there  - no discharge  Blood only occurred in the morning, then stopped for the rest of the day.  Recent UTI treated with cephalexin 500 mg 3 times daily for 5 days  Takes Metoprolol XL - for tachycardia     Medications: Outpatient Medications Prior to Visit  Medication Sig   Cetirizine HCl 10 MG CAPS Take 10 mg by mouth daily.    Cholecalciferol (VITAMIN D3) 1000 units CAPS Take 1,000 Units by mouth daily.    Coenzyme Q10 (COQ10) 200 MG CAPS Take by mouth.   folic acid (FOLVITE) 1 MG tablet Take 1 tablet (1 mg total) by mouth Three (3) times a day. Take on non-methotrexate days   methotrexate (RHEUMATREX) 2.5 MG tablet TAKE 4 TABLETS BY MOUTH ONCE WEEKLY. PROTECT FROM LIGHT   metoprolol succinate (TOPROL-XL) 50 MG 24 hr tablet Take 1.5 tablets (75 mg total) by mouth daily.   Multiple Vitamins-Minerals (ONE-A-DAY WOMENS 50 PLUS PO) Take 1 tablet by mouth daily.   Probiotic Product (ALIGN PO) Take by mouth.   Psyllium (METAMUCIL PO) Take by mouth.   tolterodine (DETROL LA) 4 MG 24 hr capsule Take 1 capsule (4 mg total) by mouth daily.   No facility-administered medications prior to visit.    Review of Systems  Constitutional:  Positive for fatigue (baseline). Negative for appetite change, chills and fever.   Respiratory:  Negative for chest tightness and shortness of breath.   Cardiovascular:  Negative for chest pain and palpitations.  Gastrointestinal:  Negative for abdominal pain, nausea and vomiting.  Genitourinary:  Positive for dysuria, frequency (baseline), hematuria and urgency (baseline, on tolteradine). Negative for decreased urine volume, difficulty urinating, flank pain, vaginal bleeding and vaginal discharge.  Neurological:  Negative for dizziness and weakness.        Objective    BP 135/69 (BP Location: Left Arm, Patient Position: Sitting, Cuff Size: Normal)   Pulse 84   Ht 5\' 8"  (1.727 m)   Wt 184 lb 12.8 oz (83.8 kg)   SpO2 98%   BMI 28.10 kg/m     Physical Exam Vitals and nursing note reviewed.  Constitutional:      General: She is not in acute distress.    Appearance: Normal appearance.  HENT:     Head: Normocephalic and atraumatic.  Eyes:     General: No scleral icterus.    Conjunctiva/sclera: Conjunctivae normal.  Cardiovascular:     Rate and Rhythm: Normal rate.  Pulmonary:     Effort: Pulmonary effort is normal.  Genitourinary:   Neurological:     Mental Status: She is alert and oriented to person, place, and time. Mental status is at baseline.  Psychiatric:        Mood and  Affect: Mood normal.        Behavior: Behavior normal.      Results for orders placed or performed in visit on 11/01/22  POCT Urinalysis Dipstick  Result Value Ref Range   Color, UA     Clarity, UA     Glucose, UA Negative Negative   Bilirubin, UA negative    Ketones, UA negative    Spec Grav, UA 1.015 1.010 - 1.025   Blood, UA negative    pH, UA 6.0 5.0 - 8.0   Protein, UA Negative Negative   Urobilinogen, UA 0.2 0.2 or 1.0 E.U./dL   Nitrite, UA negative    Leukocytes, UA Negative Negative   Appearance     Odor      Assessment & Plan    Vulvar abscess Assessment & Plan: Vulvar abscess noted on physical exam, very small in diameter without presence of  fluctuance to warrant I&D. Recommended conservative therapy with warm, moist compresses a minimum of 3-4 times per day as well as keeping the area clean and dry.  - Unable to prescribe Bactrim due to potential interaction with patient's methotrexate.  - Prescribed doxycycline and Augmentin for patient to use together if conservative therapy does not work.  Patient is aware and expresses understanding of these instructions.  Orders: -     Doxycycline Hyclate; Take 1 tablet (100 mg total) by mouth 2 (two) times daily.  Dispense: 14 tablet; Refill: 0 -     Amoxicillin-Pot Clavulanate; Take 1 tablet by mouth 2 (two) times daily.  Dispense: 14 tablet; Refill: 0  Hematuria, unspecified type -     POCT urinalysis dipstick  Tachycardia Assessment & Plan: Tx'd by card w/metoprolol succinate 75 mg daily. No acute concerns; defer to cardiology for management.     Return if symptoms worsen or fail to improve.      I discussed the assessment and treatment plan with the patient  The patient was provided an opportunity to ask questions and all were answered. The patient agreed with the plan and demonstrated an understanding of the instructions.   The patient was advised to call back or seek an in-person evaluation if the symptoms worsen or if the condition fails to improve as anticipated.    Sherlyn Hay, DO  [facility] [phone] (phone) [phone] (fax)  [facility]

## 2022-11-01 NOTE — Assessment & Plan Note (Addendum)
Tx'd by card w/metoprolol succinate 75 mg daily. No acute concerns; defer to cardiology for management.

## 2022-11-01 NOTE — Assessment & Plan Note (Addendum)
Recent UTI treated with cephalexin

## 2022-11-01 NOTE — Patient Instructions (Signed)
Try warm compresses atleast 3-4 times per day. Give it a few days to see if it gets better. If it doesn't seem to be improving after a few days, go ahead and start the antibiotics.

## 2022-11-02 NOTE — Addendum Note (Signed)
Addended by: Kristian Covey on: 11/02/2022 03:57 PM   Modules accepted: Orders

## 2022-11-07 ENCOUNTER — Encounter: Payer: Self-pay | Admitting: Family Medicine

## 2022-11-07 DIAGNOSIS — N764 Abscess of vulva: Secondary | ICD-10-CM | POA: Insufficient documentation

## 2022-11-07 NOTE — Assessment & Plan Note (Addendum)
Vulvar abscess noted on physical exam, very small in diameter without presence of fluctuance to warrant I&D. Recommended conservative therapy with warm, moist compresses a minimum of 3-4 times per day as well as keeping the area clean and dry.  - Unable to prescribe Bactrim due to potential interaction with patient's methotrexate.  - Prescribed doxycycline and Augmentin for patient to use together if conservative therapy does not work.  Patient is aware and expresses understanding of these instructions.

## 2022-11-16 DIAGNOSIS — G4733 Obstructive sleep apnea (adult) (pediatric): Secondary | ICD-10-CM | POA: Diagnosis not present

## 2022-11-20 ENCOUNTER — Other Ambulatory Visit: Payer: Self-pay | Admitting: Dermatology

## 2022-11-20 DIAGNOSIS — M793 Panniculitis, unspecified: Secondary | ICD-10-CM

## 2022-11-21 ENCOUNTER — Telehealth: Payer: Self-pay | Admitting: Family Medicine

## 2022-11-22 ENCOUNTER — Other Ambulatory Visit: Payer: Self-pay | Admitting: Family Medicine

## 2022-11-22 DIAGNOSIS — R350 Frequency of micturition: Secondary | ICD-10-CM

## 2022-11-22 MED ORDER — TOLTERODINE TARTRATE ER 4 MG PO CP24: 4.0000 mg | ORAL_CAPSULE | Freq: Every day | ORAL | 3 refills | Status: DC

## 2022-11-22 NOTE — Telephone Encounter (Signed)
[facility] is requesting prescription refill tolterodine (DETROL LA) 4 MG 24 hr capsule   Please advise

## 2022-11-22 NOTE — Telephone Encounter (Signed)
LR: 10/26/2021 LOV: 11/01/2022

## 2022-12-01 DIAGNOSIS — H2513 Age-related nuclear cataract, bilateral: Secondary | ICD-10-CM | POA: Diagnosis not present

## 2022-12-01 DIAGNOSIS — H40003 Preglaucoma, unspecified, bilateral: Secondary | ICD-10-CM | POA: Diagnosis not present

## 2022-12-01 DIAGNOSIS — H43813 Vitreous degeneration, bilateral: Secondary | ICD-10-CM | POA: Diagnosis not present

## 2022-12-29 DIAGNOSIS — M793 Panniculitis, unspecified: Secondary | ICD-10-CM | POA: Diagnosis not present

## 2022-12-30 LAB — HEPATIC FUNCTION PANEL
ALT: 29 [IU]/L (ref 0–32)
AST: 27 [IU]/L (ref 0–40)
Albumin: 4.6 g/dL (ref 3.9–4.9)
Alkaline Phosphatase: 80 [IU]/L (ref 44–121)
Bilirubin Total: 0.4 mg/dL (ref 0.0–1.2)
Bilirubin, Direct: 0.13 mg/dL (ref 0.00–0.40)
Total Protein: 6.6 g/dL (ref 6.0–8.5)

## 2023-01-02 ENCOUNTER — Telehealth: Payer: Self-pay

## 2023-01-02 DIAGNOSIS — M793 Panniculitis, unspecified: Secondary | ICD-10-CM

## 2023-01-02 MED ORDER — METHOTREXATE SODIUM 2.5 MG PO TABS: ORAL_TABLET | ORAL | 4 refills | Status: DC

## 2023-01-02 NOTE — Telephone Encounter (Addendum)
Patient was called and discussed lab results. Patient verbalized understanding. Refills sent in for patient's methotrexate. ----- Message from Armida Sans sent at 12/30/2022  6:03 PM EST ----- Liver tests from 12/29/2022 were all NORMAL Continue Methotrexate for Lipodermatosclerosus

## 2023-01-05 DIAGNOSIS — G4733 Obstructive sleep apnea (adult) (pediatric): Secondary | ICD-10-CM | POA: Diagnosis not present

## 2023-01-16 DIAGNOSIS — R682 Dry mouth, unspecified: Secondary | ICD-10-CM | POA: Diagnosis not present

## 2023-01-16 DIAGNOSIS — R5383 Other fatigue: Secondary | ICD-10-CM | POA: Diagnosis not present

## 2023-01-16 DIAGNOSIS — H6121 Impacted cerumen, right ear: Secondary | ICD-10-CM | POA: Diagnosis not present

## 2023-01-16 DIAGNOSIS — H903 Sensorineural hearing loss, bilateral: Secondary | ICD-10-CM | POA: Diagnosis not present

## 2023-02-04 DIAGNOSIS — G4733 Obstructive sleep apnea (adult) (pediatric): Secondary | ICD-10-CM | POA: Diagnosis not present

## 2023-02-07 DIAGNOSIS — G4733 Obstructive sleep apnea (adult) (pediatric): Secondary | ICD-10-CM | POA: Diagnosis not present

## 2023-02-08 ENCOUNTER — Ambulatory Visit: Payer: 59 | Admitting: Diagnostic Neuroimaging

## 2023-03-29 DIAGNOSIS — M3500 Sicca syndrome, unspecified: Secondary | ICD-10-CM | POA: Diagnosis not present

## 2023-03-29 DIAGNOSIS — U099 Post covid-19 condition, unspecified: Secondary | ICD-10-CM | POA: Diagnosis not present

## 2023-03-29 DIAGNOSIS — M793 Panniculitis, unspecified: Secondary | ICD-10-CM | POA: Diagnosis not present

## 2023-04-03 ENCOUNTER — Other Ambulatory Visit: Payer: Self-pay | Admitting: Family Medicine

## 2023-04-03 DIAGNOSIS — Z1231 Encounter for screening mammogram for malignant neoplasm of breast: Secondary | ICD-10-CM

## 2023-04-07 ENCOUNTER — Ambulatory Visit
Admission: RE | Admit: 2023-04-07 | Discharge: 2023-04-07 | Disposition: A | Discharge status: Home / Self Care | Payer: 59 | Attending: Family Medicine | Admitting: Family Medicine

## 2023-04-07 ENCOUNTER — Ambulatory Visit: Payer: 59 | Admitting: Family Medicine

## 2023-04-07 ENCOUNTER — Encounter: Payer: Self-pay | Admitting: Family Medicine

## 2023-04-07 ENCOUNTER — Ambulatory Visit
Admission: RE | Admit: 2023-04-07 | Discharge: 2023-04-07 | Disposition: A | Discharge status: Home / Self Care | Payer: 59 | Source: Ambulatory Visit | Attending: Family Medicine | Admitting: Family Medicine

## 2023-04-07 VITALS — BP 141/72 | HR 114 | Temp 98.7°F | Ht 68.0 in | Wt 188.0 lb

## 2023-04-07 DIAGNOSIS — J189 Pneumonia, unspecified organism: Secondary | ICD-10-CM

## 2023-04-07 DIAGNOSIS — J101 Influenza due to other identified influenza virus with other respiratory manifestations: Secondary | ICD-10-CM

## 2023-04-07 DIAGNOSIS — J111 Influenza due to unidentified influenza virus with other respiratory manifestations: Secondary | ICD-10-CM | POA: Diagnosis not present

## 2023-04-07 LAB — POC COVID19/FLU A&B COMBO
Covid Antigen, POC: NEGATIVE
Influenza A Antigen, POC: POSITIVE — AB
Influenza B Antigen, POC: NEGATIVE

## 2023-04-07 MED ORDER — DOXYCYCLINE HYCLATE 100 MG PO TABS: 100.0000 mg | ORAL_TABLET | Freq: Two times a day (BID) | ORAL | 0 refills | Status: AC

## 2023-04-07 NOTE — Progress Notes (Signed)
Acute visit   Patient: Hannah Gonzalez   DOB: [birth year]   63 y.o. Female  MRN: [id number]  Chief Complaint  Patient presents with   Fever    Fever X 5 days associated with dry cough. Wednesday when she woke up symptoms had moved into her head, runny nose and noticing blood when blowing her nose, sneezing continuously, having to sleep at an incline, rib cage pain due to sneezing and coughing so much reports both are deep.   Subjective    Discussed the use of AI scribe software for clinical note transcription with the patient, who gave verbal consent to proceed.  History of Present Illness   The patient, with a history of flu vaccination, presents with a five-day history of fever, cough, and upper respiratory symptoms. The symptoms started with a peculiar scratchy feeling in the chest, which progressed to a deep, barky cough and a fever of 101 degrees. Over the next few days, the patient developed sinus congestion, swollen eyes, and a runny nose. The patient also reported sneezing and a runny nose so severe that it was filling up her mask. The patient has been sleeping upright due to difficulty breathing. The patient tested negative for COVID-19 at home. The patient's physical was rescheduled due to her illness.       Review of Systems  Objective    BP (!) 141/72 (BP Location: Left Arm, Patient Position: Sitting, Cuff Size: Normal)   Pulse (!) 114   Temp 98.7 F (37.1 C) (Oral)   Ht 5\' 8"  (1.727 m)   Wt 188 lb (85.3 kg)   SpO2 99%   BMI 28.59 kg/m   Physical Exam Vitals reviewed.  Constitutional:      General: She is not in acute distress.    Appearance: Normal appearance. She is well-developed. She is not diaphoretic.  HENT:     Head: Normocephalic and atraumatic.     Right Ear: Tympanic membrane, ear canal and external ear normal.     Left Ear: Tympanic membrane, ear canal and external ear normal.     Nose: Nose normal.     Mouth/Throat:     Mouth: Mucous  membranes are moist.     Pharynx: Oropharynx is clear. No oropharyngeal exudate.  Eyes:     General: No scleral icterus.    Conjunctiva/sclera: Conjunctivae normal.     Pupils: Pupils are equal, round, and reactive to light.  Cardiovascular:     Rate and Rhythm: Normal rate and regular rhythm.     Heart sounds: Normal heart sounds. No murmur heard. Pulmonary:     Effort: Pulmonary effort is normal. No respiratory distress.     Breath sounds: Rhonchi (RLL) present. No wheezing.  Musculoskeletal:     Cervical back: Neck supple.     Right lower leg: No edema.     Left lower leg: No edema.  Lymphadenopathy:     Cervical: No cervical adenopathy.  Skin:    General: Skin is warm and dry.     Findings: No rash.  Neurological:     Mental Status: She is alert.       No results found for any visits on 04/07/23.  Assessment & Plan     Problem List Items Addressed This Visit   None Visit Diagnoses       Influenza A    -  Primary   Relevant Orders   DG Chest 2 View   POC Covid19/Flu A&B  Antigen     Pneumonia of right lower lobe due to infectious organism       Relevant Medications   doxycycline (VIBRA-TABS) 100 MG tablet   Other Relevant Orders   DG Chest 2 View   POC Covid19/Flu A&B Antigen           Influenza A Confirmed positive for Influenza A via rapid test. Symptoms include fever (101F), deep cough, nasal congestion, sneezing, and significant fatigue for five days. She received a flu vaccine, which may have reduced the severity. Tamiflu is not indicated as it is effective only within the first 48 hours of symptom onset. Flu typically lasts 7-10 days, with peak symptoms around day 5. Emphasized the importance of rest and hydration. - Recommend symptomatic treatment with acetaminophen or ibuprofen for fever and body aches. - Advise using honey for cough relief. - Encourage rest and hydration.  Pneumonia Suspected lobar pneumonia based on physical exam findings of  abnormal lung sounds localized to the right lower lung. Symptoms include persistent fever and productive cough. She is at risk for secondary bacterial pneumonia following influenza. Discussed the need for antibiotic treatment and monitoring for improvement.  - Prescribe doxycycline 100 mg, one pill twice a day. - Order chest x-ray at the outpatient imaging center to confirm pneumonia diagnosis. - Advise her to start doxycycline immediately and monitor for improvement. - Instruct her to seek medical attention if symptoms worsen or do not improve.  General Health Maintenance She rescheduled her physical exam due to current illness. The new appointment is set for Monday, April 17, 2023. - Proceed with the physical exam on April 17, 2023, if she has recovered from the current illness.  Follow-up - Follow up if symptoms do not improve or worsen. - Review chest x-ray results once available. - Proceed with the physical exam on April 17, 2023.       Meds ordered this encounter  Medications   doxycycline (VIBRA-TABS) 100 MG tablet    Sig: Take 1 tablet (100 mg total) by mouth 2 (two) times daily for 7 days.    Dispense:  14 tablet    Refill:  0     Return if symptoms worsen or fail to improve.      Shirlee Latch, MD  [facility] [phone] (phone) [phone] (fax)  [facility]

## 2023-04-10 ENCOUNTER — Encounter: Payer: Self-pay | Admitting: Family Medicine

## 2023-04-17 ENCOUNTER — Encounter: Payer: Self-pay | Admitting: Family Medicine

## 2023-04-17 ENCOUNTER — Ambulatory Visit (INDEPENDENT_AMBULATORY_CARE_PROVIDER_SITE_OTHER): Payer: 59 | Admitting: Family Medicine

## 2023-04-17 VITALS — BP 136/70 | HR 90 | Resp 18 | Ht 68.0 in | Wt 187.9 lb

## 2023-04-17 DIAGNOSIS — E782 Mixed hyperlipidemia: Secondary | ICD-10-CM

## 2023-04-17 DIAGNOSIS — I10 Essential (primary) hypertension: Secondary | ICD-10-CM | POA: Diagnosis not present

## 2023-04-17 DIAGNOSIS — Z0000 Encounter for general adult medical examination without abnormal findings: Secondary | ICD-10-CM

## 2023-04-17 DIAGNOSIS — G4733 Obstructive sleep apnea (adult) (pediatric): Secondary | ICD-10-CM | POA: Diagnosis not present

## 2023-04-17 DIAGNOSIS — Z0001 Encounter for general adult medical examination with abnormal findings: Secondary | ICD-10-CM | POA: Diagnosis not present

## 2023-04-17 DIAGNOSIS — R739 Hyperglycemia, unspecified: Secondary | ICD-10-CM | POA: Diagnosis not present

## 2023-04-17 NOTE — Assessment & Plan Note (Signed)
 Blood pressure well-controlled on current medication regimen. - Continue metoprolol

## 2023-04-17 NOTE — Assessment & Plan Note (Signed)
 Quality of sleep improved significantly  Using CPAP regularly with good compliance

## 2023-04-17 NOTE — Assessment & Plan Note (Signed)
 Reviewed last lipid panel Not currently on a statin Recheck FLP and CMP Discussed diet and exercise

## 2023-04-17 NOTE — Progress Notes (Signed)
 Complete physical exam   Patient: Hannah Gonzalez   DOB: September 30, 1960   63 y.o. Female  MRN: [id number] Visit Date: 04/17/2023  Today's healthcare provider: Shirlee Latch, MD   Chief Complaint  Patient presents with   Annual Exam   Subjective    Hannah Gonzalez is a 63 y.o. female who presents today for a complete physical exam.  She reports consuming a general diet.  She generally feels well. She reports sleeping well. She does not have additional problems to discuss today.    Discussed the use of AI scribe software for clinical note transcription with the patient, who gave verbal consent to proceed.  History of Present Illness   The patient, with a history of hypertension and sleep apnea, presents for a routine physical examination. She reports no new complaints or concerns. The patient's blood pressure is well-controlled on metoprolol, and she continues to use her CPAP machine for sleep apnea, which she reports has significantly improved her quality of sleep. The patient recently recovered from the flu and pneumonia, and she reports feeling stronger each day since her illness.  The patient also has a history of lipodermatosclerosis, for which she was previously taking methotrexate. However, due to lack of improvement and side effects from the medication, she decided to discontinue it. The patient reports having been evaluated for Sjogren's syndrome, but the blood work came back normal. She continues to experience symptoms suggestive of Sjogren's syndrome, but no definitive diagnosis has been made.        Last depression screening scores    04/17/2023    9:53 AM 11/01/2022   10:54 AM 10/03/2022   10:25 AM  PHQ 2/9 Scores  PHQ - 2 Score 0 0 0  PHQ- 9 Score 1     Last fall risk screening    04/17/2023    9:53 AM  Fall Risk   Falls in the past year? 0  Number falls in past yr: 0  Injury with Fall? 0        Medications: Outpatient Medications Prior to  Visit  Medication Sig   Cetirizine HCl 10 MG CAPS Take 10 mg by mouth daily.    Cholecalciferol (VITAMIN D3) 1000 units CAPS Take 1,000 Units by mouth daily.    metoprolol succinate (TOPROL-XL) 50 MG 24 hr tablet Take 1.5 tablets (75 mg total) by mouth daily.   Multiple Vitamins-Minerals (ONE-A-DAY WOMENS 50 PLUS PO) Take 1 tablet by mouth daily.   Probiotic Product (ALIGN PO) Take by mouth.   Psyllium (METAMUCIL PO) Take by mouth.   tolterodine (DETROL LA) 4 MG 24 hr capsule Take 1 capsule (4 mg total) by mouth daily.   [DISCONTINUED] Coenzyme Q10 (COQ10) 200 MG CAPS Take by mouth.   [DISCONTINUED] folic acid (FOLVITE) 1 MG tablet Take 1 tablet (1 mg total) by mouth Three (3) times a day. Take on non-methotrexate days   [DISCONTINUED] methotrexate (RHEUMATREX) 2.5 MG tablet TAKE 4 TABLETS ONCE WEEKLY   No facility-administered medications prior to visit.    Review of Systems    Objective    BP 136/70   Pulse 90   Resp 18   Ht 5\' 8"  (1.727 m)   Wt 187 lb 14.4 oz (85.2 kg)   SpO2 97%   BMI 28.57 kg/m    Physical Exam Vitals reviewed.  Constitutional:      General: She is not in acute distress.    Appearance: Normal appearance. She is well-developed. She  is not diaphoretic.  HENT:     Head: Normocephalic and atraumatic.     Right Ear: Tympanic membrane, ear canal and external ear normal.     Left Ear: Tympanic membrane, ear canal and external ear normal.     Nose: Nose normal.     Mouth/Throat:     Mouth: Mucous membranes are moist.     Pharynx: Oropharynx is clear. No oropharyngeal exudate.  Eyes:     General: No scleral icterus.    Conjunctiva/sclera: Conjunctivae normal.     Pupils: Pupils are equal, round, and reactive to light.  Neck:     Thyroid: No thyromegaly.  Cardiovascular:     Rate and Rhythm: Normal rate and regular rhythm.     Heart sounds: Normal heart sounds. No murmur heard. Pulmonary:     Effort: Pulmonary effort is normal. No respiratory  distress.     Breath sounds: Normal breath sounds. No wheezing or rales.  Abdominal:     General: There is no distension.     Palpations: Abdomen is soft.     Tenderness: There is no abdominal tenderness.  Musculoskeletal:        General: No deformity.     Cervical back: Neck supple.     Right lower leg: No edema.     Left lower leg: No edema.  Lymphadenopathy:     Cervical: No cervical adenopathy.  Skin:    General: Skin is warm and dry.     Findings: No rash.  Neurological:     Mental Status: She is alert and oriented to person, place, and time. Mental status is at baseline.     Gait: Gait normal.  Psychiatric:        Mood and Affect: Mood normal.        Behavior: Behavior normal.        Thought Content: Thought content normal.      No results found for any visits on 04/17/23.  Assessment & Plan    Routine Health Maintenance and Physical Exam  Exercise Activities and Dietary recommendations  Goals   None     Immunization History  Administered Date(s) Administered   Fluad Quad(high Dose 65+) 11/17/2021   Influenza Inj Mdck Quad Pf 11/17/2018   Influenza Split 11/22/2015   Influenza Whole 11/21/2016   Influenza-Unspecified 12/03/2014, 11/04/2020, 12/14/2022   Moderna Covid-19 Vaccine Bivalent Booster 23yrs & up 11/19/2020   PFIZER(Purple Top)SARS-COV-2 Vaccination 04/10/2019, 05/01/2019, 01/27/2020   Td 09/21/2015   Tdap 08/30/2005   Zoster Recombinant(Shingrix) 06/05/2020, 09/14/2020    Health Maintenance  Topic Date Due   COVID-19 Vaccine (5 - 2024-25 season) 10/23/2022   MAMMOGRAM  05/12/2024   Colonoscopy  05/18/2025   DTaP/Tdap/Td (3 - Td or Tdap) 09/20/2025   Cervical Cancer Screening (Pap smear)  06/05/2026   Cervical Cancer Screening (HPV/Pap Cotest)  06/05/2026   INFLUENZA VACCINE  Completed   Hepatitis C Screening  Completed   HIV Screening  Completed   Zoster Vaccines- Shingrix  Completed   HPV VACCINES  Aged Out    Discussed health  benefits of physical activity, and encouraged her to engage in regular exercise appropriate for her age and condition.  Problem List Items Addressed This Visit       Cardiovascular and Mediastinum   Essential hypertension   Blood pressure well-controlled on current medication regimen. - Continue metoprolol      Relevant Orders   Comprehensive metabolic panel     Respiratory   OSA (  obstructive sleep apnea)   Quality of sleep improved significantly  Using CPAP regularly with good compliance        Other   Mixed hyperlipidemia   Reviewed last lipid panel Not currently on a statin Recheck FLP and CMP Discussed diet and exercise       Relevant Orders   Comprehensive metabolic panel   Lipid panel   Other Visit Diagnoses       Encounter for annual physical exam    -  Primary   Relevant Orders   Hemoglobin A1c   Comprehensive metabolic panel   Lipid panel   CBC w/Diff/Platelet   TSH     Hyperglycemia       Relevant Orders   Hemoglobin A1c           Long COVID Ongoing symptoms since December 2023, with multiple non-specific issues persisting. Multiple specialists consulted without definitive diagnosis. Discussed symptom management. - Monitor and manage symptoms as she arises  Lipodermatosclerosis Discontinued methotrexate due to lack of improvement and side effects. Rheumatologist ruled out Sjogren's syndrome based on blood work. Discussed rationale for stopping methotrexate. - Monitor and manage symptomatically  General Health Maintenance Routine physical examination. Generally well with no new complaints. Blood pressure and heart rate are well-managed. Compliant with medications and CPAP usage. Discussed various screenings and vaccinations, including the benefits and convenience of the pneumonia vaccine. Determined fasting for lab work unnecessary based on recent studies. - Schedule mammogram for late March - Schedule Pap smear for next year at age 7 -  Schedule colon cancer screening for 2027 - Administer pneumonia vaccine (can be done at pharmacy or after lunch at the office) - Order labs for cholesterol, kidney and liver function, blood counts, thyroid, and A1c - Schedule next physical for a year from now as a 'Welcome to [facility]' visit  Follow-up - Schedule next physical for a year from now as a 'Welcome to [facility]' visit - Follow-up via MyChart for lab results.        Return in about 1 year (around 04/16/2024) for Welcome to Medicare.     Shirlee Latch, MD  [facility] [phone] (phone) [phone] (fax)  [facility]

## 2023-04-18 ENCOUNTER — Encounter: Payer: Self-pay | Admitting: Family Medicine

## 2023-04-18 LAB — HEMOGLOBIN A1C
Est. average glucose Bld gHb Est-mCnc: 117 mg/dL
Hgb A1c MFr Bld: 5.7 % — ABNORMAL HIGH (ref 4.8–5.6)

## 2023-04-18 LAB — CBC WITH DIFFERENTIAL/PLATELET
Basophils Absolute: 0 10*3/uL (ref 0.0–0.2)
Basos: 1 %
EOS (ABSOLUTE): 0.1 10*3/uL (ref 0.0–0.4)
Eos: 2 %
Hematocrit: 40.1 % (ref 34.0–46.6)
Hemoglobin: 13.4 g/dL (ref 11.1–15.9)
Immature Grans (Abs): 0 10*3/uL (ref 0.0–0.1)
Immature Granulocytes: 0 %
Lymphocytes Absolute: 2.5 10*3/uL (ref 0.7–3.1)
Lymphs: 44 %
MCH: 30.2 pg (ref 26.6–33.0)
MCHC: 33.4 g/dL (ref 31.5–35.7)
MCV: 90 fL (ref 79–97)
Monocytes Absolute: 0.5 10*3/uL (ref 0.1–0.9)
Monocytes: 8 %
Neutrophils Absolute: 2.6 10*3/uL (ref 1.4–7.0)
Neutrophils: 45 %
Platelets: 275 10*3/uL (ref 150–450)
RBC: 4.44 x10E6/uL (ref 3.77–5.28)
RDW: 12.2 % (ref 11.7–15.4)
WBC: 5.7 10*3/uL (ref 3.4–10.8)

## 2023-04-18 LAB — LIPID PANEL
Chol/HDL Ratio: 5.9 {ratio} — ABNORMAL HIGH (ref 0.0–4.4)
Cholesterol, Total: 241 mg/dL — ABNORMAL HIGH (ref 100–199)
HDL: 41 mg/dL (ref >39)
LDL Chol Calc (NIH): 156 mg/dL — ABNORMAL HIGH (ref 0–99)
Triglycerides: 238 mg/dL — ABNORMAL HIGH (ref 0–149)
VLDL Cholesterol Cal: 44 mg/dL — ABNORMAL HIGH (ref 5–40)

## 2023-04-18 LAB — COMPREHENSIVE METABOLIC PANEL
ALT: 19 [IU]/L (ref 0–32)
AST: 21 [IU]/L (ref 0–40)
Albumin: 4.3 g/dL (ref 3.9–4.9)
Alkaline Phosphatase: 72 [IU]/L (ref 44–121)
BUN/Creatinine Ratio: 14 (ref 12–28)
BUN: 12 mg/dL (ref 8–27)
Bilirubin Total: 0.3 mg/dL (ref 0.0–1.2)
CO2: 24 mmol/L (ref 20–29)
Calcium: 9.8 mg/dL (ref 8.7–10.3)
Chloride: 103 mmol/L (ref 96–106)
Creatinine, Ser: 0.88 mg/dL (ref 0.57–1.00)
Globulin, Total: 2 g/dL (ref 1.5–4.5)
Glucose: 90 mg/dL (ref 70–99)
Potassium: 4.6 mmol/L (ref 3.5–5.2)
Sodium: 143 mmol/L (ref 134–144)
Total Protein: 6.3 g/dL (ref 6.0–8.5)
eGFR: 73 mL/min/{1.73_m2} (ref >59)

## 2023-04-18 LAB — TSH: TSH: 1.52 u[IU]/mL (ref 0.450–4.500)

## 2023-05-04 ENCOUNTER — Encounter: Payer: Self-pay | Admitting: Dermatology

## 2023-05-04 ENCOUNTER — Ambulatory Visit: Payer: Managed Care, Other (non HMO) | Admitting: Dermatology

## 2023-05-04 DIAGNOSIS — Z85828 Personal history of other malignant neoplasm of skin: Secondary | ICD-10-CM | POA: Diagnosis not present

## 2023-05-04 DIAGNOSIS — Z86018 Personal history of other benign neoplasm: Secondary | ICD-10-CM | POA: Diagnosis not present

## 2023-05-04 DIAGNOSIS — L814 Other melanin hyperpigmentation: Secondary | ICD-10-CM

## 2023-05-04 DIAGNOSIS — M793 Panniculitis, unspecified: Secondary | ICD-10-CM | POA: Diagnosis not present

## 2023-05-04 DIAGNOSIS — L570 Actinic keratosis: Secondary | ICD-10-CM | POA: Diagnosis not present

## 2023-05-04 DIAGNOSIS — D1801 Hemangioma of skin and subcutaneous tissue: Secondary | ICD-10-CM | POA: Diagnosis not present

## 2023-05-04 DIAGNOSIS — L578 Other skin changes due to chronic exposure to nonionizing radiation: Secondary | ICD-10-CM

## 2023-05-04 DIAGNOSIS — Z1283 Encounter for screening for malignant neoplasm of skin: Secondary | ICD-10-CM | POA: Diagnosis not present

## 2023-05-04 DIAGNOSIS — Z7189 Other specified counseling: Secondary | ICD-10-CM

## 2023-05-04 DIAGNOSIS — W908XXA Exposure to other nonionizing radiation, initial encounter: Secondary | ICD-10-CM | POA: Diagnosis not present

## 2023-05-04 DIAGNOSIS — L821 Other seborrheic keratosis: Secondary | ICD-10-CM | POA: Diagnosis not present

## 2023-05-04 DIAGNOSIS — L820 Inflamed seborrheic keratosis: Secondary | ICD-10-CM | POA: Diagnosis not present

## 2023-05-04 DIAGNOSIS — D229 Melanocytic nevi, unspecified: Secondary | ICD-10-CM

## 2023-05-04 NOTE — Patient Instructions (Addendum)

## 2023-05-04 NOTE — Progress Notes (Signed)
 Follow-Up Visit   Subjective  Hannah Gonzalez is a 63 y.o. female who presents for the following: Skin Cancer Screening and Full Body Skin Exam, hx of BCC, hx of Dysplastic nevus.  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  The patient presents for Total-Body Skin Exam (TBSE) for skin cancer screening and mole check. The patient has spots, moles and lesions to be evaluated, some may be new or changing and the patient may have concern these could be cancer.  The following portions of the chart were reviewed this encounter and updated as appropriate: medications, allergies, medical history  Review of Systems:  No other skin or systemic complaints except as noted in HPI or Assessment and Plan.  Objective  Well appearing patient in no apparent distress; mood and affect are within normal limits.  A full examination was performed including scalp, head, eyes, ears, nose, lips, neck, chest, axillae, abdomen, back, buttocks, bilateral upper extremities, bilateral lower extremities, hands, feet, fingers, toes, fingernails, and toenails. All findings within normal limits unless otherwise noted below.   Relevant physical exam findings are noted in the Assessment and Plan.  left nose x 1, right anterior side burn x 1 (2) Erythematous thin papules/macules with gritty scale.  right sideburn x 2, left pretibial x 2, right pretibial x 1 (5) Stuck-on, waxy, tan-brown papule or plaque --Discussed benign etiology and prognosis.   Assessment & Plan   SKIN CANCER SCREENING PERFORMED TODAY.  ACTINIC DAMAGE - Chronic condition, secondary to cumulative UV/sun exposure - diffuse scaly erythematous macules with underlying dyspigmentation - Recommend daily broad spectrum sunscreen SPF 30+ to sun-exposed areas, reapply every 2 hours as needed.  -  Staying in the shade or wearing long sleeves, sun glasses (UVA+UVB protection) and wide brim hats (4-inch brim around the entire circumference of the hat) are also recommended for sun protection.  - Call for new or changing lesions.  LENTIGINES, SEBORRHEIC KERATOSES, HEMANGIOMAS - Benign normal skin lesions - Benign-appearing - Call for any changes  MELANOCYTIC NEVI - Tan-brown and/or pink-flesh-colored symmetric macules and papules - Benign appearing on exam today - Observation - Call clinic for new or changing moles - Recommend daily use of broad spectrum spf 30+ sunscreen to sun-exposed areas.   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 Condition is symptomatic / bothersome to patient. Not to goal. Has improved with methotrexate treatment resulting in smaller plaques today compared to last visit and not as tender.  He is off methotrexate now.  He does not want any further treatment for her lipodermatosclerosis at this time. Patient decline treatment   HISTORY OF DYSPLASTIC NEVUS No evidence of recurrence today Recommend regular full body skin exams Recommend daily broad spectrum sunscreen SPF 30+ to sun-exposed areas, reapply every 2 hours as needed.  Call if any new or changing lesions are noted between office visits   HISTORY OF BASAL CELL CARCINOMA OF THE SKIN - No evidence of recurrence today - Recommend regular full body skin exams - Recommend daily broad spectrum sunscreen SPF 30+ to sun-exposed areas, reapply every 2 hours as needed.  - Call if any new or changing lesions are noted between office visits   AK (ACTINIC KERATOSIS) (2) left nose x 1,  right anterior side burn x 1 (2) ACTINIC DAMAGE - chronic, secondary to cumulative UV radiation exposure/sun exposure over time - diffuse scaly erythematous  macules with underlying dyspigmentation - Recommend daily broad spectrum sunscreen SPF 30+ to sun-exposed areas, reapply every 2 hours as needed.  - Recommend staying in the shade or wearing long sleeves, sun glasses (UVA+UVB protection) and wide brim hats (4-inch brim around the entire circumference of the hat). - Call for new or changing lesions.   If not gone in 8 weeks return to the office  Destruction of lesion - left nose x 1, right anterior side burn x 1 (2) Complexity: simple   Destruction method: cryotherapy   Informed consent: discussed and consent obtained   Timeout:  patient name, date of birth, surgical site, and procedure verified Lesion destroyed using liquid nitrogen: Yes   Region frozen until ice ball extended beyond lesion: Yes   Outcome: patient tolerated procedure well with no complications   Post-procedure details: wound care instructions given   INFLAMED SEBORRHEIC KERATOSIS (5) right sideburn x 2, left pretibial x 2, right pretibial x 1 (5) Symptomatic, irritating, patient would like treated.   If not gone on the right sideburn in 8 weeks Return to the office  Destruction of lesion - right sideburn x 2, left pretibial x 2, right pretibial x 1 (5) Complexity: simple   Destruction method: cryotherapy   Informed consent: discussed and consent obtained   Timeout:  patient name, date of birth, surgical site, and procedure verified Lesion destroyed using liquid nitrogen: Yes   Region frozen until ice ball extended beyond lesion: Yes   Outcome: patient tolerated procedure well with no complications   Post-procedure details: wound care instructions given     Return in about 1 year (around 05/03/2024) for TBSE, hx of BCC, hx of Dysplastic nevus.  IAngelique Holm, CMA, am acting as scribe for Armida Sans, MD .   Documentation: I have reviewed the above documentation for accuracy and completeness, and I agree with the above.  Armida Sans, MD

## 2023-05-15 ENCOUNTER — Ambulatory Visit
Admission: RE | Admit: 2023-05-15 | Discharge: 2023-05-15 | Disposition: A | Discharge status: Home / Self Care | Payer: 59 | Source: Ambulatory Visit | Attending: Family Medicine | Admitting: Family Medicine

## 2023-05-15 DIAGNOSIS — Z1231 Encounter for screening mammogram for malignant neoplasm of breast: Secondary | ICD-10-CM | POA: Insufficient documentation

## 2023-05-18 ENCOUNTER — Encounter: Payer: Self-pay | Admitting: Family Medicine

## 2023-05-22 ENCOUNTER — Encounter: Payer: Self-pay | Admitting: Dermatology

## 2023-05-22 ENCOUNTER — Ambulatory Visit: Admitting: Dermatology

## 2023-05-22 DIAGNOSIS — W908XXA Exposure to other nonionizing radiation, initial encounter: Secondary | ICD-10-CM

## 2023-05-22 DIAGNOSIS — L570 Actinic keratosis: Secondary | ICD-10-CM

## 2023-05-22 DIAGNOSIS — L820 Inflamed seborrheic keratosis: Secondary | ICD-10-CM | POA: Diagnosis not present

## 2023-05-22 DIAGNOSIS — L578 Other skin changes due to chronic exposure to nonionizing radiation: Secondary | ICD-10-CM

## 2023-05-22 DIAGNOSIS — Z7189 Other specified counseling: Secondary | ICD-10-CM

## 2023-05-22 NOTE — Patient Instructions (Addendum)

## 2023-05-22 NOTE — Progress Notes (Signed)
   Follow-Up Visit   Subjective  Hannah Gonzalez is a 63 y.o. female who presents for the following: 3 weeks f/u on precancers on her face treated with LN2 05/04/2023. Recheck ISKs on the right lower leg and left lower leg treated with LN2 05/04/2023. The patient has spots, moles and lesions to be evaluated, some may be new or changing and the patient may have concern these could be cancer.  The following portions of the chart were reviewed this encounter and updated as appropriate: medications, allergies, medical history  Review of Systems:  No other skin or systemic complaints except as noted in HPI or Assessment and Plan.  Objective  Well appearing patient in no apparent distress; mood and affect are within normal limits.  A focused examination was performed of the following areas:face,right leg, left leg    Relevant exam findings are noted in the Assessment and Plan.  left nose x 2 (2) Erythematous thin papules/macules with gritty scale.  right lateral pretibial x 1 Stuck-on, waxy, tan-brown papule --Discussed benign etiology and prognosis.        Assessment & Plan   ACTINIC DAMAGE - chronic, secondary to cumulative UV radiation exposure/sun exposure over time - diffuse scaly erythematous macules with underlying dyspigmentation - Recommend daily broad spectrum sunscreen SPF 30+ to sun-exposed areas, reapply every 2 hours as needed.  - Recommend staying in the shade or wearing long sleeves, sun glasses (UVA+UVB protection) and wide brim hats (4-inch brim around the entire circumference of the hat). - Call for new or changing lesions.    AK (ACTINIC KERATOSIS) (2) left nose x 2 (2) Persistent Actinic keratosis x1 and new AK nasal brigge See photos. Destruction of lesion - left nose x 2 (2) Complexity: simple   Destruction method: cryotherapy   Informed consent: discussed and consent obtained   Timeout:  patient name, date of birth, surgical site, and procedure  verified Lesion destroyed using liquid nitrogen: Yes   Region frozen until ice ball extended beyond lesion: Yes   Outcome: patient tolerated procedure well with no complications   Post-procedure details: wound care instructions given   INFLAMED SEBORRHEIC KERATOSIS right lateral pretibial x 1 Resolving Irritated Seborrheic keratosis  Left leg x 2  Observe  Photos taken will recheck at next office visit  Persistent ISK- retreat today  Symptomatic, irritating, patient would like treated.  Destruction of lesion - right lateral pretibial x 1 Complexity: simple   Destruction method: cryotherapy   Informed consent: discussed and consent obtained   Timeout:  patient name, date of birth, surgical site, and procedure verified Lesion destroyed using liquid nitrogen: Yes   Region frozen until ice ball extended beyond lesion: Yes   Outcome: patient tolerated procedure well with no complications   Post-procedure details: wound care instructions given    Return in about 3 months (around 08/21/2023) for Aks .  IAngelique Holm, CMA, am acting as scribe for Armida Sans, MD .   Documentation: I have reviewed the above documentation for accuracy and completeness, and I agree with the above.  Armida Sans, MD

## 2023-05-30 ENCOUNTER — Other Ambulatory Visit: Payer: Self-pay | Admitting: Cardiovascular Disease

## 2023-05-30 DIAGNOSIS — I10 Essential (primary) hypertension: Secondary | ICD-10-CM

## 2023-06-06 NOTE — Progress Notes (Signed)
 Cardiology Office Note  Date:  06/09/2023   ID:  Hannah Gonzalez, DOB Apr 03, 1960, MRN [id number]  PCP:  Myrla Jon HERO, MD   Cc: palpitations  HPI:  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  OSA on CPAP who presents for evaluation of her tachycardia and hyperlipidemia  LOV 3/24 In follow-up today she reports doing well Husband back to work, 10 to 12 hrs  Likes to [employer], likes to cook No exercise, feels she is limited secondary to hip pain, bunions limiting exertion completed back surgery, hip replacement surgery  on CPAP  Lab work reviewed Total cholesterol 241, LDL 156 A1C 5.7  on metoprolol  succinate 75 in the morning Tachycardia controlled   dermatologic issue,  on methotrexate  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   Prior CT scan chest 2016 and 2023 no significant coronary calcification noted or aortic atherosclerosis Images pulled up and reviewed  Did lifescreening in the past,  All normal  EKG personally reviewed by myself on todays visit EKG Interpretation Date/Time:  Friday June 09 2023 11:42:17 EDT Ventricular Rate:  94 PR Interval:  120 QRS Duration:  72 QT Interval:  360 QTC Calculation: 450 R Axis:   27  Text Interpretation: Normal sinus rhythm Normal ECG When compared with ECG of 02-Mar-2009 19:29, No significant change was found Confirmed by Perla Lye [phone]) on 06/09/2023 11:56:08 AM   Other past medical history reviewed 30 day monitor reviewed with her showing normal sinus rhythm, no significant arrhythmia, average heart rate 80-90 bpm  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 fib,  father with pacemaker, died of a stroke Both parents have very high cholesterol    PMH:   has a past medical history of Allergy (12/2017), Arthritis, Basal cell carcinoma (05/04/2001), basal cell carcinoma (05/04/2001), dysplastic nevus (02/23/2009), dysplastic nevus (11/23/2009), Palpitations, Poison ivy dermatitis (09/21/2020), Sleep apnea, and Vitamin D  deficiency.  PSH:    Past Surgical History:  Procedure Laterality Date   ANTERIOR CRUCIATE LIGAMENT REPAIR     ARTHROSCOPIC REPAIR ACL     left   BACK SURGERY     BIOPSY BREAST     left    BREAST BIOPSY Left 2010   benign-core   JOINT REPLACEMENT  12/2017   Right hip   LAPAROSCOPY     MYOMECTOMY     PAROTIDECTOMY     SPINE SURGERY  09/2016   THYMECTOMY     TONSILLECTOMY     TOTAL HIP ARTHROPLASTY Right 01/10/2018   Procedure: TOTAL HIP ARTHROPLASTY ANTERIOR APPROACH;  Surgeon: Leora Lynwood SAUNDERS, MD;  Location: [facility] ORS;  Service: Orthopedics;  Laterality: Right;    Current Outpatient Medications  Medication Sig Dispense Refill   Cetirizine HCl 10 MG CAPS Take 10 mg by mouth daily.      Cholecalciferol  (VITAMIN D3) 1000 units CAPS Take 1,000 Units by mouth daily.      cyanocobalamin (VITAMIN B12) 100 MCG tablet daily.     metoprolol  succinate (TOPROL -XL) 50 MG 24 hr tablet TAKE 1 AND 1/2 TABLET BY MOUTH DAILY 45 tablet 0   Multiple Vitamins-Minerals (ONE-A-DAY WOMENS 50 PLUS PO) Take 1 tablet by mouth daily.     Probiotic Product (ALIGN PO) Take  by mouth.     Psyllium (METAMUCIL PO) Take by mouth.     tolterodine  (DETROL  LA) 4 MG 24 hr capsule Take 1 capsule (4 mg total) by mouth daily. 90 capsule 3   No current facility-administered medications for this visit.   Allergies:   Aspirin    Social History:  The patient  reports that she has never smoked. She has never used smokeless tobacco. She reports that she does not drink alcohol and does not use drugs.   Family History:   family history includes Arrhythmia in her mother;  Arthritis in her father and mother; Breast cancer in her cousin and maternal aunt; COPD in her father; Cancer in her maternal aunt and mother; Heart disease in her mother; Heart failure in her mother; Hyperlipidemia in her brother and mother; Hypertension in her brother, father, and mother; Prostate cancer in her father; Stroke in her father.   Review of Systems: Review of Systems  Constitutional: Negative.   HENT: Negative.    Respiratory: Negative.    Cardiovascular: Negative.   Gastrointestinal: Negative.   Musculoskeletal: Negative.   Neurological: Negative.   Psychiatric/Behavioral: Negative.    All other systems reviewed and are negative.  PHYSICAL EXAM: VS:  BP 120/86 (BP Location: Left Arm, Patient Position: Sitting, Cuff Size: Normal)   Pulse 94   Ht 5' 8 (1.727 m)   Wt 185 lb 3.2 oz (84 kg)   SpO2 96%   BMI 28.16 kg/m  , BMI Body mass index is 28.16 kg/m. Constitutional:  oriented to person, place, and time. No distress.  HENT:  Head: Grossly normal Eyes:  no discharge. No scleral icterus.  Neck: No JVD, no carotid bruits  Cardiovascular: Regular rate and rhythm, no murmurs appreciated Pulmonary/Chest: Clear to auscultation bilaterally, no wheezes or rails Abdominal: Soft.  no distension.  no tenderness.  Musculoskeletal: Normal range of motion Neurological:  normal muscle tone. Coordination normal. No atrophy Skin: Skin warm and dry Psychiatric: normal affect, pleasant  Recent Labs: 04/17/2023: ALT 19; BUN 12; Creatinine, Ser 0.88; Hemoglobin 13.4; Platelets 275; Potassium 4.6; Sodium 143; TSH 1.520   Lipid Panel Lab Results  Component Value Date   CHOL 241 (H) 04/17/2023   HDL 41 04/17/2023   LDLCALC 156 (H) 04/17/2023   TRIG 238 (H) 04/17/2023    Wt Readings from Last 3 Encounters:  06/09/23 185 lb 3.2 oz (84 kg)  04/17/23 187 lb 14.4 oz (85.2 kg)  04/07/23 188 lb (85.3 kg)     ASSESSMENT AND PLAN:  Tachycardia -  Symptoms well-controlled on  metoprolol  succinate 75 daily Recommend extra metoprolol  succinate 25 as needed for breakthrough symptoms  Mixed hyperlipidemia  Long discussion concerning her elevated cholesterol numbers, likely familial No significant coronary calcification on prior CT scans calcium scoring 0 in 3/24 Previously preferred not to be on a statin Discussed nonstatin options, in general she prefers not to be on medication at this time - Recommend periodic screening with calcium scoring every 5 years or so for risk stratification  Essential hypertension -  Blood pressure is well controlled on today's visit. No changes made to the medications.  Stress/adjustment disorder Retired from [employer] Mother died,  husband sister also died Stressed walking program  Weight gain Elevated A1C 5.7 Recommend calorie restriction, walking program  Orders Placed This Encounter  Procedures   EKG 12-Lead     Signed, Velinda Lunger, M.D., Ph.D. 06/09/2023  [facility] [place], [place] [phone]

## 2023-06-09 ENCOUNTER — Encounter: Payer: Self-pay | Admitting: Cardiovascular Disease

## 2023-06-09 ENCOUNTER — Ambulatory Visit: Attending: Cardiovascular Disease | Admitting: Cardiovascular Disease

## 2023-06-09 VITALS — BP 120/86 | HR 94 | Ht 68.0 in | Wt 185.2 lb

## 2023-06-09 DIAGNOSIS — E782 Mixed hyperlipidemia: Secondary | ICD-10-CM | POA: Diagnosis not present

## 2023-06-09 DIAGNOSIS — F411 Generalized anxiety disorder: Secondary | ICD-10-CM | POA: Diagnosis not present

## 2023-06-09 DIAGNOSIS — I10 Essential (primary) hypertension: Secondary | ICD-10-CM

## 2023-06-09 DIAGNOSIS — G4733 Obstructive sleep apnea (adult) (pediatric): Secondary | ICD-10-CM

## 2023-06-09 DIAGNOSIS — R000 Tachycardia, unspecified: Secondary | ICD-10-CM | POA: Diagnosis not present

## 2023-06-09 MED ORDER — METOPROLOL SUCCINATE ER 50 MG PO TB24: 75.0000 mg | ORAL_TABLET | Freq: Every day | ORAL | 3 refills | Status: AC

## 2023-06-09 NOTE — Patient Instructions (Signed)
 Medication Instructions:  No changes  If you need a refill on your cardiac medications before your next appointment, please call your pharmacy.   Lab work: No new labs needed  Testing/Procedures: No new testing needed  Follow-Up: At [facility]

## 2023-06-20 ENCOUNTER — Ambulatory Visit: Payer: 59 | Admitting: Cardiovascular Disease

## 2023-06-26 ENCOUNTER — Ambulatory Visit: Admitting: Family Medicine

## 2023-06-26 ENCOUNTER — Encounter: Payer: Self-pay | Admitting: Family Medicine

## 2023-06-26 VITALS — BP 142/80 | HR 119 | Ht 68.0 in | Wt 186.8 lb

## 2023-06-26 DIAGNOSIS — N750 Cyst of Bartholin's gland: Secondary | ICD-10-CM

## 2023-06-26 DIAGNOSIS — N952 Postmenopausal atrophic vaginitis: Secondary | ICD-10-CM

## 2023-06-26 MED ORDER — ESTRADIOL 0.1 MG/GM VA CREA: 1.0000 | TOPICAL_CREAM | VAGINAL | 12 refills | Status: DC

## 2023-06-26 NOTE — Progress Notes (Signed)
 Acute visit   Patient: Hannah Gonzalez   DOB: August 22, 1960   63 y.o. Female  MRN: [id number] PCP: Mazie Speed, MD   Chief Complaint  Patient presents with   Vaginal Concerns    Patient is present due to vaginal soreness X few weeks that she reports is not any better. Pt reports when she showers she feels something that she feels it should not be there and the past few days it has been more noticiable but today she did not notice it so much. NO form of treatment tried as she is not aware of what it is. Hx of Vulvar abscess   Subjective    Discussed the use of AI scribe software for clinical note transcription with the patient, who gave verbal consent to proceed.  History of Present Illness   Hannah Gonzalez is a 63 year old female who presents with a recurrent bump in the vulva area.  She experiences generalized soreness in the vulva and vagina, especially when showering or wiping. A bump, which she notices while washing, has increased in size over time. On the day of the visit, it was less prominent. Six to seven months ago, she had a similar cyst that burst and bled, identified as an abscess. The current bump is in the same location, off to the left side of the vulva, and is very sore, affecting her comfort when sitting or wearing tight clothing. There is no drainage, bleeding, vaginal discharge, or fever associated with the bump. No other bumps are present on her body.  She experiences dry mouth and eyes, previously managed with medication from a rheumatologist, which she discontinued due to frequent urination. She notes a general sensation of dryness.        Review of Systems  Objective    BP (!) 142/80 (BP Location: Right Arm, Patient Position: Sitting, Cuff Size: Normal)   Pulse (!) 119   Ht 5\' 8"  (1.727 m)   Wt 186 lb 12.8 oz (84.7 kg)   SpO2 95%   BMI 28.40 kg/m  Physical Exam  Physical Exam   GENITOURINARY: Small bump subcutaneous on the lower left  labia, not appearing as an abscess. Possible self-drained Bartholin cyst, small size today.       No results found for any visits on 06/26/23.  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 Currently presents as a small bump, possibly self-drained, with no signs of infection. Symptoms include tenderness and discomfort, especially during activities such as showering and sitting. Examination suggests a benign Bartholin's cyst without infection. - Recommend sitz baths with lukewarm water to promote drainage and reduce discomfort. - Refer to gynecology for further evaluation and management, including potential drainage or removal if the cyst recurs or becomes problematic.   Atrophic Vaginitis - Prescribe vaginal estrogen cream to be used two to three times a week to improve local moisture and reduce irritation.         Meds ordered this encounter  Medications   estradiol (ESTRACE VAGINAL) 0.1 MG/GM  vaginal cream    Sig: Place 1 Applicatorful vaginally 3 (three) times a week.    Dispense:  42.5 g    Refill:  12     Return if symptoms worsen or fail to improve.      Aden Agreste, MD  [facility] [phone] (phone) [phone] (fax)  [facility]

## 2023-07-20 ENCOUNTER — Ambulatory Visit: Payer: 59 | Admitting: Family Medicine

## 2023-08-21 ENCOUNTER — Ambulatory Visit: Admitting: Dermatology

## 2023-08-21 ENCOUNTER — Encounter: Payer: Self-pay | Admitting: Dermatology

## 2023-08-21 DIAGNOSIS — L820 Inflamed seborrheic keratosis: Secondary | ICD-10-CM | POA: Diagnosis not present

## 2023-08-21 DIAGNOSIS — L578 Other skin changes due to chronic exposure to nonionizing radiation: Secondary | ICD-10-CM

## 2023-08-21 DIAGNOSIS — Z7189 Other specified counseling: Secondary | ICD-10-CM

## 2023-08-21 DIAGNOSIS — D489 Neoplasm of uncertain behavior, unspecified: Secondary | ICD-10-CM

## 2023-08-21 DIAGNOSIS — W908XXA Exposure to other nonionizing radiation, initial encounter: Secondary | ICD-10-CM | POA: Diagnosis not present

## 2023-08-21 DIAGNOSIS — C4492 Squamous cell carcinoma of skin, unspecified: Secondary | ICD-10-CM

## 2023-08-21 DIAGNOSIS — D492 Neoplasm of unspecified behavior of bone, soft tissue, and skin: Secondary | ICD-10-CM

## 2023-08-21 DIAGNOSIS — L570 Actinic keratosis: Secondary | ICD-10-CM

## 2023-08-21 DIAGNOSIS — C44321 Squamous cell carcinoma of skin of nose: Secondary | ICD-10-CM

## 2023-08-21 DIAGNOSIS — Z79899 Other long term (current) drug therapy: Secondary | ICD-10-CM

## 2023-08-21 DIAGNOSIS — L821 Other seborrheic keratosis: Secondary | ICD-10-CM | POA: Diagnosis not present

## 2023-08-21 DIAGNOSIS — C44320 Squamous cell carcinoma of skin of unspecified parts of face: Secondary | ICD-10-CM

## 2023-08-21 DIAGNOSIS — Z5111 Encounter for antineoplastic chemotherapy: Secondary | ICD-10-CM | POA: Diagnosis not present

## 2023-08-21 HISTORY — DX: Squamous cell carcinoma of skin, unspecified: C44.92

## 2023-08-21 NOTE — Progress Notes (Signed)
 Follow-Up Visit   Subjective  Hannah Gonzalez is a 63 y.o. female who presents for the following: 3 month ak follow up Spot today at right nose she noticed, and some spots at upper right arm. Hx of spots at right nose, left leg and right leg that need to be rechecked.   The patient has spots, moles and lesions to be evaluated, some may be new or changing and the patient may have concern these could be cancer.  The following portions of the chart were reviewed this encounter and updated as appropriate: medications, allergies, medical history  Review of Systems:  No other skin or systemic complaints except as noted in HPI or Assessment and Plan.  Objective  Well appearing patient in no apparent distress; mood and affect are within normal limits.  A focused examination was performed of the following areas: Face, left leg, right leg   Relevant exam findings are noted in the Assessment and Plan.  right upper arm x 7, left leg x 2 (9) Erythematous stuck-on, waxy papule or plaque Left Nasal Sidewall 0.6 cm crusted papule with texture change  right nasal bridge x 1, right anterior nasal alar crease x 1 (2) Crusted papule see photo   Assessment & Plan   SEBORRHEIC KERATOSIS At right upper arm  - Stuck-on, waxy, tan-brown papules and/or plaques  - Benign-appearing - Discussed benign etiology and prognosis. - Observe - Call for any changes  ACTINIC DAMAGE - chronic, secondary to cumulative UV radiation exposure/sun exposure over time - diffuse scaly erythematous macules with underlying dyspigmentation - Recommend daily broad spectrum sunscreen SPF 30+ to sun-exposed areas, reapply every 2 hours as needed.  - Recommend staying in the shade or wearing long sleeves, sun glasses (UVA+UVB protection) and wide brim hats (4-inch brim around the entire circumference of the hat). - Call for new or changing lesions.  INFLAMED SEBORRHEIC KERATOSIS (9) right upper arm x 7, left leg x  2 (9) Symptomatic, irritating, patient would like treated. Destruction of lesion - right upper arm x 7, left leg x 2 (9) Complexity: simple   Destruction method: cryotherapy   Informed consent: discussed and consent obtained   Timeout:  patient name, date of birth, surgical site, and procedure verified Lesion destroyed using liquid nitrogen: Yes   Region frozen until ice ball extended beyond lesion: Yes   Outcome: patient tolerated procedure well with no complications   Post-procedure details: wound care instructions given    NEOPLASM OF UNCERTAIN BEHAVIOR Left Nasal Sidewall Epidermal / dermal shaving  Lesion diameter (cm):  0.6 Informed consent: discussed and consent obtained   Timeout: patient name, date of birth, surgical site, and procedure verified   Procedure prep:  Patient was prepped and draped in usual sterile fashion Prep type:  Isopropyl alcohol Anesthesia: the lesion was anesthetized in a standard fashion   Anesthetic:  1% lidocaine  w/ epinephrine  1-100,000 buffered w/ 8.4% NaHCO3 Instrument used: flexible razor blade   Hemostasis achieved with: pressure, aluminum chloride and electrodesiccation   Outcome: patient tolerated procedure well   Post-procedure details: sterile dressing applied and wound care instructions given   Dressing type: bandage and petrolatum    Destruction of lesion Complexity: extensive   Destruction method: electrodesiccation and curettage   Informed consent: discussed and consent obtained   Timeout:  patient name, date of birth, surgical site, and procedure verified Procedure prep:  Patient was prepped and draped in usual sterile fashion Prep type:  Isopropyl alcohol Anesthesia: the lesion  was anesthetized in a standard fashion   Anesthetic:  1% lidocaine  w/ epinephrine  1-100,000 buffered w/ 8.4% NaHCO3 Curettage performed in three different directions: Yes   Electrodesiccation performed over the curetted area: Yes   Final wound size (cm):   0.6 Hemostasis achieved with:  pressure, aluminum chloride and electrodesiccation Outcome: patient tolerated procedure well with no complications   Post-procedure details: sterile dressing applied and wound care instructions given   Dressing type: bandage and petrolatum    Specimen 1 - Surgical pathology Differential Diagnosis: R/o ak vs scc vs bcc  ED&C today  Check Margins: no R/o ak vs scc vs bcc   Discussed may use 22f/u cream to same area  ACTINIC KERATOSIS (2) right nasal bridge x 1, right anterior nasal alar crease x 1 (2) Start cream Sept 1 - Start 5-fluorouracil/calcipotriene cream twice a day for 10 days to affected areas including right nasal bridge . Prescription sent to [facility]. Patient advised they will receive an email to purchase the medication online and have it sent to their home. Patient provided with handout reviewing treatment course and side effects and advised to call or message us  on MyChart with any concerns.  Reviewed course of treatment and expected reaction.  Patient advised to expect inflammation and crusting and advised that erosions are possible.  Patient advised to be diligent with sun protection during and after treatment. Counseled to keep medication out of reach of children and pets.  ACTINIC DAMAGE WITH PRECANCEROUS ACTINIC KERATOSES Counseling for Topical Chemotherapy Management: Patient exhibits: - Severe, confluent actinic changes with pre-cancerous actinic keratoses that is secondary to cumulative UV radiation exposure over time - Condition that is severe; chronic, not at goal. - diffuse scaly erythematous macules and papules with underlying dyspigmentation - Discussed Prescription Field Treatment topical Chemotherapy for Severe, Chronic Confluent Actinic Changes with Pre-Cancerous Actinic Keratoses Field treatment involves treatment of an entire area of skin that has confluent Actinic Changes (Sun/ Ultraviolet light  damage) and PreCancerous Actinic Keratoses by method of PhotoDynamic Therapy (PDT) and/or prescription Topical Chemotherapy agents such as 5-fluorouracil, 5-fluorouracil/calcipotriene, and/or imiquimod.  The purpose is to decrease the number of clinically evident and subclinical PreCancerous lesions to prevent progression to development of skin cancer by chemically destroying early precancer changes that may or may not be visible.  It has been shown to reduce the risk of developing skin cancer in the treated area. As a result of treatment, redness, scaling, crusting, and open sores may occur during treatment course. One or more than one of these methods may be used and may have to be used several times to control, suppress and eliminate the PreCancerous changes. Discussed treatment course, expected reaction, and possible side effects. - Recommend daily broad spectrum sunscreen SPF 30+ to sun-exposed areas, reapply every 2 hours as needed.  - Staying in the shade or wearing long sleeves, sun glasses (UVA+UVB protection) and wide brim hats (4-inch brim around the entire circumference of the hat) are also recommended. - Call for new or changing lesions.  SEBORRHEIC KERATOSIS   ACTINIC SKIN DAMAGE   CHEMOTHERAPY MANAGEMENT, ENCOUNTER FOR   COUNSELING AND COORDINATION OF CARE   MEDICATION MANAGEMENT    Return for keep follow up in march as schedule.  IEleanor Blush, CMA, am acting as scribe for Alm Rhyme, MD.   Documentation: I have reviewed the above documentation for accuracy and completeness, and I agree with the above.  Alm Rhyme, MD

## 2023-08-21 NOTE — Patient Instructions (Addendum)
 Start cream in September 1 Start - Start 5-fluorouracil/calcipotriene cream twice a day for 10 days to affected areas including nasal bride, right nose crease . Prescription sent to [facility]. Patient advised they will receive an email to purchase the medication online and have it sent to their home. Patient provided with handout reviewing treatment course and side effects and advised to call or message us  on MyChart with any concerns.  Reviewed course of treatment and expected reaction.  Patient advised to expect inflammation and crusting and advised that erosions are possible.  Patient advised to be diligent with sun protection during and after treatment. Counseled to keep medication out of reach of children and pets.  Instructions for [facility]  One or more of your medications was sent to the [facility] order [facility]. You will receive an email from them and can purchase the medicine through that link. It will then be mailed to your home at the address you confirmed. If for any reason you do not receive an email from them, please check your spam folder. If you still do not find the email, please let us  know. [facility] phone number is [phone].     5-Fluorouracil/Calcipotriene Patient Education   Actinic keratoses are the dry, red scaly spots on the skin caused by sun damage. A portion of these spots can turn into skin cancer with time, and treating them can help prevent development of skin cancer.   Treatment of these spots requires removal of the defective skin cells. There are various ways to remove actinic keratoses, including freezing with liquid nitrogen, treatment with creams, or treatment with a blue light procedure in the office.   5-fluorouracil cream is a topical cream used to treat actinic keratoses. It works by interfering with the growth of abnormal fast-growing skin cells, such as actinic keratoses. These  cells peel off and are replaced by healthy ones.   5-fluorouracil/calcipotriene is a combination of the 5-fluorouracil cream with a vitamin D  analog cream called calcipotriene. The calcipotriene alone does not treat actinic keratoses. However, when it is combined with 5-fluorouracil, it helps the 5-fluorouracil treat the actinic keratoses much faster so that the same results can be achieved with a much shorter treatment time.  INSTRUCTIONS FOR 5-FLUOROURACIL/CALCIPOTRIENE CREAM:   5-fluorouracil/calcipotriene cream typically only needs to be used for 4-7 days. A thin layer should be applied twice a day to the treatment areas recommended by your physician.   If your physician prescribed you separate tubes of 5-fluourouracil and calcipotriene, apply a thin layer of 5-fluorouracil followed by a thin layer of calcipotriene.   Avoid contact with your eyes, nostrils, and mouth. Do not use 5-fluorouracil/calcipotriene cream on infected or open wounds.   You will develop redness, irritation and some crusting at areas where you have pre-cancer damage/actinic keratoses. IF YOU DEVELOP PAIN, BLEEDING, OR SIGNIFICANT CRUSTING, STOP THE TREATMENT EARLY - you have already gotten a good response and the actinic keratoses should clear up well.  Wash your hands after applying 5-fluorouracil 5% cream on your skin.   A moisturizer or sunscreen with a minimum SPF 30 should be applied each morning.   Once you have finished the treatment, you can apply a thin layer of Vaseline twice a day to irritated areas to soothe and calm the areas more quickly. If you experience significant discomfort, contact your physician.  For some patients it is necessary to repeat the treatment for best results.  SIDE EFFECTS: When using  5-fluorouracil/calcipotriene cream, you may have mild irritation, such as redness, dryness, swelling, or a mild burning sensation. This usually resolves within 2 weeks. The more actinic keratoses you  have, the more redness and inflammation you can expect during treatment. Eye irritation has been reported rarely. If this occurs, please let us  know.  If you have any trouble using this cream, please call the office. If you have any other questions about this information, please do not hesitate to ask me before you leave the office.    Biopsy Wound Care Instructions  Leave the original bandage on for 24 hours if possible.  If the bandage becomes soaked or soiled before that time, it is OK to remove it and examine the wound.  A small amount of post-operative bleeding is normal.  If excessive bleeding occurs, remove the bandage, place gauze over the site and apply continuous pressure (no peeking) over the area for 30 minutes. If this does not work, please call our clinic as soon as possible or page your doctor if it is after hours.   Once a day, cleanse the wound with soap and water. It is fine to shower. If a thick crust develops you may use a Q-tip dipped into dilute hydrogen peroxide (mix 1:1 with water) to dissolve it.  Hydrogen peroxide can slow the healing process, so use it only as needed.    After washing, apply petroleum jelly (Vaseline) or an antibiotic ointment if your doctor prescribed one for you, followed by a bandage.    For best healing, the wound should be covered with a layer of ointment at all times. If you are not able to keep the area covered with a bandage to hold the ointment in place, this may mean re-applying the ointment several times a day.  Continue this wound care until the wound has healed and is no longer open.   Itching and mild discomfort is normal during the healing process. However, if you develop pain or severe itching, please call our office.   If you have any discomfort, you can take Tylenol  (acetaminophen ) or ibuprofen as directed on the bottle. (Please do not take these if you have an allergy to them or cannot take them for another reason).  Some redness,  tenderness and white or yellow material in the wound is normal healing.  If the area becomes very sore and red, or develops a thick yellow-green material (pus), it may be infected; please notify us .    If you have stitches, return to clinic as directed to have the stitches removed. You will continue wound care for 2-3 days after the stitches are removed.   Wound healing continues for up to one year following surgery. It is not unusual to experience pain in the scar from time to time during the interval.  If the pain becomes severe or the scar thickens, you should notify the office.    A slight amount of redness in a scar is expected for the first six months.  After six months, the redness will fade and the scar will soften and fade.  The color difference becomes less noticeable with time.  If there are any problems, return for a post-op surgery check at your earliest convenience.  To improve the appearance of the scar, you can use silicone scar gel, cream, or sheets (such as Mederma or Serica) every night for up to one year. These are available over the counter (without a prescription).  Please call our office at [phone]  for any questions or concerns.      Actinic keratoses are precancerous spots that appear secondary to cumulative UV radiation exposure/sun exposure over time. They are chronic with expected duration over 1 year. A portion of actinic keratoses will progress to squamous cell carcinoma of the skin. It is not possible to reliably predict which spots will progress to skin cancer and so treatment is recommended to prevent development of skin cancer.  Recommend daily broad spectrum sunscreen SPF 30+ to sun-exposed areas, reapply every 2 hours as needed.  Recommend staying in the shade or wearing long sleeves, sun glasses (UVA+UVB protection) and wide brim hats (4-inch brim around the entire circumference of the hat). Call for new or changing lesions.     Seborrheic  Keratosis  What causes seborrheic keratoses? Seborrheic keratoses are harmless, common skin growths that first appear during adult life.  As time goes by, more growths appear.  Some people may develop a large number of them.  Seborrheic keratoses appear on both covered and uncovered body parts.  They are not caused by sunlight.  The tendency to develop seborrheic keratoses can be inherited.  They vary in color from skin-colored to gray, brown, or even black.  They can be either smooth or have a rough, warty surface.   Seborrheic keratoses are superficial and look as if they were stuck on the skin.  Under the microscope this type of keratosis looks like layers upon layers of skin.  That is why at times the top layer may seem to fall off, but the rest of the growth remains and re-grows.    Treatment Seborrheic keratoses do not need to be treated, but can easily be removed in the office.  Seborrheic keratoses often cause symptoms when they rub on clothing or jewelry.  Lesions can be in the way of shaving.  If they become inflamed, they can cause itching, soreness, or burning.  Removal of a seborrheic keratosis can be accomplished by freezing, burning, or surgery. If any spot bleeds, scabs, or grows rapidly, please return to have it checked, as these can be an indication of a skin cancer.    Due to recent changes in healthcare laws, you may see results of your pathology and/or laboratory studies on MyChart before the doctors have had a chance to review them. We understand that in some cases there may be results that are confusing or concerning to you. Please understand that not all results are received at the same time and often the doctors may need to interpret multiple results in order to provide you with the best plan of care or course of treatment. Therefore, we ask that you please give us  2 business days to thoroughly review all your results before contacting the office for clarification. Should we see  a critical lab result, you will be contacted sooner.   If You Need Anything After Your Visit  If you have any questions or concerns for your doctor, please call our main line at [phone] and press option 4 to reach your doctor's medical assistant. If no one answers, please leave a voicemail as directed and we will return your call as soon as possible. Messages left after 4 pm will be answered the following business day.   You may also send us  a message via MyChart. We typically respond to MyChart messages within 1-2 business days.  For prescription refills, please ask your pharmacy to contact our office. Our fax number is [phone].  If you have an  urgent issue when the clinic is closed that cannot wait until the next business day, you can page your doctor at the number below.    Please note that while we do our best to be available for urgent issues outside of office hours, we are not available 24/7.   If you have an urgent issue and are unable to reach us , you may choose to seek medical care at your doctor's office, retail clinic, urgent care center, or emergency room.  If you have a medical emergency, please immediately call 911 or go to the emergency department.  Pager Numbers  - Dr. Hester: [phone]  - Dr. Jackquline: [phone]  - Dr. Claudene: [phone]   In the event of inclement weather, please call our main line at [phone] for an update on the status of any delays or closures.  Dermatology Medication Tips: Please keep the boxes that topical medications come in in order to help keep track of the instructions about where and how to use these. Pharmacies typically print the medication instructions only on the boxes and not directly on the medication tubes.   If your medication is too expensive, please contact our office at [phone] option 4 or send us  a message through MyChart.   We are unable to tell what your co-pay for medications will be in advance as  this is different depending on your insurance coverage. However, we may be able to find a substitute medication at lower cost or fill out paperwork to get insurance to cover a needed medication.   If a prior authorization is required to get your medication covered by your insurance company, please allow us  1-2 business days to complete this process.  Drug prices often vary depending on where the prescription is filled and some pharmacies may offer cheaper prices.  The website www.goodrx.com contains coupons for medications through different pharmacies. The prices here do not account for what the cost may be with help from insurance (it may be cheaper with your insurance), but the website can give you the price if you did not use any insurance.  - You can print the associated coupon and take it with your prescription to the pharmacy.  - You may also stop by our office during regular business hours and pick up a GoodRx coupon card.  - If you need your prescription sent electronically to a different pharmacy, notify our office through [facility] or by phone at [phone] option 4.     Si Usted Necesita Algo Despus de Su Visita  Tambin puede enviarnos un mensaje a travs de Clinical cytogeneticist. Por lo general respondemos a los mensajes de MyChart en el transcurso de 1 a 2 das hbiles.  Para renovar recetas, por favor pida a su farmacia que se ponga en contacto con nuestra oficina. Randi lakes de fax es [place] [phone].  Si tiene un asunto urgente cuando la clnica est cerrada y que no puede esperar hasta el siguiente da hbil, puede llamar/localizar a su doctor(a) al nmero que aparece a continuacin.   Por favor, tenga en cuenta que aunque hacemos todo lo posible para estar disponibles para asuntos urgentes fuera del horario de [place], no estamos disponibles las 24 horas del da, los 7 [address]  [address] de la [place].   Si tiene un problema urgente y no puede comunicarse con nosotros, puede optar por  buscar atencin mdica  en el consultorio de su doctor(a), en una clnica privada, en un centro de atencin urgente o en una sala de  emergencias.  Si tiene Engineer, drilling, por favor llame inmediatamente al 911 o vaya a la sala de emergencias.  Nmeros de bper  - Dr. Hester: [phone]  - Dra. Jackquline: [phone]  - Dr. Claudene: [phone]   En caso de inclemencias del tiempo, por favor llame a landry capes principal al [phone] para una actualizacin sobre el [place] de cualquier retraso o cierre.  Consejos para la medicacin en dermatologa: Por favor, guarde las cajas en las que vienen los medicamentos de uso tpico para ayudarle a seguir las instrucciones sobre dnde y cmo usarlos. Las farmacias generalmente imprimen las instrucciones del medicamento slo en las cajas y no directamente en los tubos del [place].   Si su medicamento es muy caro, por favor, pngase en contacto con landry rieger llamando al [phone] y presione la opcin 4 o envenos un mensaje a travs de Clinical cytogeneticist.   No podemos decirle cul ser su copago por los medicamentos por adelantado ya que esto es diferente dependiendo de la cobertura de su seguro. Sin embargo, es posible que podamos encontrar un medicamento sustituto a Audiological scientist un formulario para que el seguro cubra el medicamento que se considera necesario.   Si se requiere una autorizacin previa para que su compaa de seguros malta su medicamento, por favor permtanos de 1 a 2 das hbiles para completar este proceso.  Los precios de los medicamentos varan con frecuencia dependiendo del Environmental consultant de dnde se surte la receta y alguna farmacias pueden ofrecer precios ms baratos.  El sitio web www.goodrx.com tiene cupones para medicamentos de Health and safety inspector. Los precios aqu no tienen en cuenta lo que podra costar con la ayuda del seguro (puede ser ms barato con su seguro), pero el sitio web puede darle el precio si no  utiliz Tourist information centre manager.  - Puede imprimir el cupn correspondiente y llevarlo con su receta a la farmacia.  - Tambin puede pasar por nuestra oficina durante el horario de atencin regular y Education officer, museum una tarjeta de cupones de GoodRx.  - Si necesita que su receta se enve electrnicamente a una farmacia diferente, informe a nuestra oficina a travs de MyChart de [place] o por telfono llamando al [phone] y presione la opcin 4.

## 2023-08-23 LAB — SURGICAL PATHOLOGY

## 2023-08-28 ENCOUNTER — Ambulatory Visit: Payer: Self-pay | Admitting: Dermatology

## 2023-08-28 NOTE — Telephone Encounter (Addendum)
 Tried calling patient regarding results. No answer. Lm for patient to return call.   ----- Message from Alm Rhyme sent at 08/28/2023  5:23 PM EDT ----- FINAL DIAGNOSIS        1. 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

## 2023-08-29 ENCOUNTER — Encounter: Payer: Self-pay | Admitting: Dermatology

## 2023-08-29 NOTE — Telephone Encounter (Signed)
 Spoke with patient by phone. Advised her of BX results and answered MyChart question.  All questions and concerns answered.

## 2023-09-04 DIAGNOSIS — J029 Acute pharyngitis, unspecified: Secondary | ICD-10-CM | POA: Diagnosis not present

## 2023-09-04 DIAGNOSIS — Z6827 Body mass index (BMI) 27.0-27.9, adult: Secondary | ICD-10-CM | POA: Diagnosis not present

## 2023-09-04 DIAGNOSIS — J069 Acute upper respiratory infection, unspecified: Secondary | ICD-10-CM | POA: Diagnosis not present

## 2023-09-05 MED ORDER — DOXYCYCLINE MONOHYDRATE 100 MG PO CAPS: ORAL_CAPSULE | ORAL | 0 refills | Status: DC

## 2023-09-05 MED ORDER — MUPIROCIN 2 % EX OINT: TOPICAL_OINTMENT | CUTANEOUS | 0 refills | Status: DC

## 2023-09-05 NOTE — Addendum Note (Signed)
 Addended by: WILLMA KNEE A on: 09/05/2023 04:58 PM   Modules accepted: Orders

## 2023-09-12 DIAGNOSIS — G4733 Obstructive sleep apnea (adult) (pediatric): Secondary | ICD-10-CM | POA: Diagnosis not present

## 2023-11-20 ENCOUNTER — Other Ambulatory Visit: Payer: Self-pay | Admitting: Family Medicine

## 2023-11-20 ENCOUNTER — Other Ambulatory Visit: Payer: Self-pay

## 2023-11-20 ENCOUNTER — Telehealth: Payer: Self-pay | Admitting: Family Medicine

## 2023-11-20 DIAGNOSIS — R350 Frequency of micturition: Secondary | ICD-10-CM

## 2023-11-20 NOTE — Telephone Encounter (Unsigned)
 Copied from CRM [id number]. Topic: Clinical - Medication Refill >> Nov 20, 2023  4:09 PM Debby BROCKS wrote: Medication:  tolterodine  (DETROL  LA) 4 MG 24 hr capsule   Has the patient contacted their pharmacy? Yes (Agent: If no, request that the patient contact the pharmacy for the refill. If patient does not wish to contact the pharmacy document the reason why and proceed with request.) (Agent: If yes, when and what did the pharmacy advise?) No refills Left  This is the patient's preferred pharmacy:  [facility] [id number] [place], [place] - [address] [place] [address] Phone: [phone] Fax: [phone]  Is this the correct pharmacy for this prescription? Yes If no, delete pharmacy and type the correct one.   Has the prescription been filled recently? Yes  Is the patient out of the medication? No, has enough for the end of the week  Has the patient been seen for an appointment in the last year OR does the patient have an upcoming appointment? Yes  Can we respond through MyChart? Yes  Agent: Please be advised that Rx refills may take up to 3 business days. We ask that you follow-up with your pharmacy.

## 2023-11-20 NOTE — Telephone Encounter (Signed)
 We received a refill request from [facility] at [address] [place], [place] [address], Phone: [phone] RX: Tolterodine  Tar ER 4 MG CAP QTY: 90

## 2023-11-21 ENCOUNTER — Telehealth: Payer: Self-pay | Admitting: Family Medicine

## 2023-11-21 MED ORDER — TOLTERODINE TARTRATE ER 4 MG PO CP24: 4.0000 mg | ORAL_CAPSULE | Freq: Every day | ORAL | 3 refills | Status: AC

## 2023-11-21 NOTE — Telephone Encounter (Signed)
 [facility] faxed refill request for the following medications:  tolterodine  (DETROL  LA) 4 MG 24 hr capsule    Please advise.

## 2023-11-22 NOTE — Telephone Encounter (Signed)
 Duplicate request, refilled 11/21/23.  Requested Prescriptions  Pending Prescriptions Disp Refills   tolterodine  (DETROL  LA) 4 MG 24 hr capsule 90 capsule 3    Sig: Take 1 capsule (4 mg total) by mouth daily.     Urology:  Bladder Agents 2 Passed - 11/22/2023 12:33 PM      Passed - Cr in normal range and within 360 days    Creatinine, Ser  Date Value Ref Range Status  04/17/2023 0.88 0.57 - 1.00 mg/dL Final         Passed - ALT in normal range and within 360 days    ALT  Date Value Ref Range Status  04/17/2023 19 0 - 32 IU/L Final         Passed - AST in normal range and within 360 days    AST  Date Value Ref Range Status  04/17/2023 21 0 - 40 IU/L Final         Passed - Valid encounter within last 12 months    Recent Outpatient Visits           4 months ago Bartholin cyst   [facility] [place], Jon HERO, MD   7 months ago Encounter for annual physical exam   [facility] [place], Jon HERO, MD   7 months ago Influenza A   [facility] [place], Jon HERO, MD       Future Appointments             In 5 months Hester Alm BROCKS, MD [facility]

## 2023-11-22 NOTE — Telephone Encounter (Signed)
 tolterodine  (DETROL  LA) 4 MG 24 hr capsule 90 capsule 3 11/21/2023 --   Sig - Route: Take 1 capsule (4 mg total) by mouth daily. - Oral   Sent to pharmacy as: tolterodine  (DETROL  LA) 4 MG 24 hr capsule   Notes to Pharmacy: Requesting 1 year supply   E-Prescribing Status: Receipt confirmed by pharmacy (11/21/2023 11:50 AM EDT)

## 2023-11-22 NOTE — Telephone Encounter (Signed)
 Disp Refills Start End   tolterodine  (DETROL  LA) 4 MG 24 hr capsule 90 capsule 3 11/21/2023 --   Sig - Route: Take 1 capsule (4 mg total) by mouth daily. - Oral   Sent to pharmacy as: tolterodine  (DETROL  LA) 4 MG 24 hr capsule   Notes to Pharmacy: Requesting 1 year supply   E-Prescribing Status: Receipt confirmed by pharmacy (11/21/2023 11:50 AM EDT)

## 2023-12-07 DIAGNOSIS — H04123 Dry eye syndrome of bilateral lacrimal glands: Secondary | ICD-10-CM | POA: Diagnosis not present

## 2023-12-07 DIAGNOSIS — H43813 Vitreous degeneration, bilateral: Secondary | ICD-10-CM | POA: Diagnosis not present

## 2023-12-07 DIAGNOSIS — H2513 Age-related nuclear cataract, bilateral: Secondary | ICD-10-CM | POA: Diagnosis not present

## 2023-12-07 DIAGNOSIS — H40003 Preglaucoma, unspecified, bilateral: Secondary | ICD-10-CM | POA: Diagnosis not present

## 2024-01-29 ENCOUNTER — Ambulatory Visit: Admitting: Physician Assistant

## 2024-01-29 VITALS — BP 134/83 | HR 89 | Resp 14 | Ht 68.0 in | Wt 187.6 lb

## 2024-01-29 DIAGNOSIS — R350 Frequency of micturition: Secondary | ICD-10-CM

## 2024-01-29 DIAGNOSIS — R3989 Other symptoms and signs involving the genitourinary system: Secondary | ICD-10-CM

## 2024-01-29 DIAGNOSIS — R319 Hematuria, unspecified: Secondary | ICD-10-CM | POA: Diagnosis not present

## 2024-01-29 LAB — POCT URINALYSIS DIPSTICK
Bilirubin, UA: NEGATIVE
Glucose, UA: NEGATIVE
Ketones, UA: NEGATIVE
Nitrite, UA: NEGATIVE
Protein, UA: NEGATIVE
Spec Grav, UA: 1.025 (ref 1.010–1.025)
Urobilinogen, UA: 0.2 U/dL
pH, UA: 6 (ref 5.0–8.0)

## 2024-01-29 MED ORDER — CEPHALEXIN 500 MG PO CAPS: 500.0000 mg | ORAL_CAPSULE | Freq: Two times a day (BID) | ORAL | 0 refills | Status: AC

## 2024-01-29 NOTE — Progress Notes (Signed)
 Established patient visit  Patient: Hannah Gonzalez   DOB: 05-Dec-1960   63 y.o. Female  MRN: [id number] Visit Date: 01/29/2024  Today's healthcare provider: Jolynn Spencer, PA-C   Chief Complaint  Patient presents with   Urinary Tract Infection    Frequency, urgency with lower abdomen pressure ongoing 3-4 days   Subjective     Discussed the use of AI scribe software for clinical note transcription with the patient, who gave verbal consent to proceed.  History of Present Illness Hannah Gonzalez is a 63 year old female who presents with symptoms suggestive of a urinary tract infection.  She has had several days of dysuria, increased urinary frequency, nocturia, and a sensation of incomplete bladder emptying, with frequent small-volume voids and more pelvic pressure than usual. She feels these symptoms are different from her baseline urinary incontinence. She denies back or flank pain and fever. She had a kidney stone about sixteen months ago and typically has about one UTI per year. She is allergic to aspirin  but has no antibiotic allergies. She takes long-standing medication for urinary incontinence and feels it may now be less effective.       04/17/2023    9:53 AM 11/01/2022   10:54 AM 10/03/2022   10:25 AM  Depression screen PHQ 2/9  Decreased Interest 0 0 0  Down, Depressed, Hopeless 0 0 0  PHQ - 2 Score 0 0 0  Altered sleeping 0    Tired, decreased energy 1    Change in appetite 0    Feeling bad or failure about yourself  0    Trouble concentrating 0    Moving slowly or fidgety/restless 0    Suicidal thoughts 0    PHQ-9 Score 1     Difficult doing work/chores Not difficult at all       Data saved with a previous flowsheet row definition      04/17/2023    9:53 AM 11/01/2022   10:54 AM  GAD 7 : Generalized Anxiety Score  Nervous, Anxious, on Edge 0 0  Control/stop worrying 0 0  Worry too much - different things 0 0  Trouble relaxing 0 0  Restless 0 0   Easily annoyed or irritable 0 0  Afraid - awful might happen 0 0  Total GAD 7 Score 0 0    Medications: Outpatient Medications Prior to Visit  Medication Sig   co-enzyme Q-10 30 MG capsule Take 30 mg by mouth daily.   Cetirizine HCl 10 MG CAPS Take 10 mg by mouth daily.    Cholecalciferol  (VITAMIN D3) 1000 units CAPS Take 1,000 Units by mouth daily.    cyanocobalamin (VITAMIN B12) 100 MCG tablet daily.   metoprolol  succinate (TOPROL -XL) 50 MG 24 hr tablet Take 1.5 tablets (75 mg total) by mouth daily.   Multiple Vitamins-Minerals (ONE-A-DAY WOMENS 50 PLUS PO) Take 1 tablet by mouth daily.   Probiotic Product (ALIGN PO) Take by mouth.   tolterodine  (DETROL  LA) 4 MG 24 hr capsule Take 1 capsule (4 mg total) by mouth daily.   [DISCONTINUED] doxycycline  (MONODOX ) 100 MG capsule Take one cap po BID x 7 days. Take with food and plenty of drink.   [DISCONTINUED] estradiol  (ESTRACE  VAGINAL) 0.1 MG/GM vaginal cream Place 1 Applicatorful vaginally 3 (three) times a week.   [DISCONTINUED] mupirocin  ointment (BACTROBAN ) 2 % Apply to wound QD until healed.   [DISCONTINUED] Psyllium (METAMUCIL PO) Take by mouth.   No facility-administered medications prior to visit.  Review of Systems All negative Except see HPI       Objective    BP 134/83   Pulse 89   Resp 14   Ht 5' 8 (1.727 m)   Wt 187 lb 9.6 oz (85.1 kg)   SpO2 98%   BMI 28.52 kg/m     Physical Exam Constitutional:      General: She is not in acute distress.    Appearance: Normal appearance.  HENT:     Head: Normocephalic.  Pulmonary:     Effort: Pulmonary effort is normal. No respiratory distress.  Abdominal:     General: There is no distension.     Palpations: There is no mass.     Tenderness: There is abdominal tenderness. There is no right CVA tenderness, left CVA tenderness, guarding or rebound.  Neurological:     Mental Status: She is alert and oriented to person, place, and time. Mental status is at  baseline.      Results for orders placed or performed in visit on 01/29/24  POCT Urinalysis Dipstick  Result Value Ref Range   Color, UA Yellow    Clarity, UA Clear    Glucose, UA Negative Negative   Bilirubin, UA Negative    Ketones, UA Negative    Spec Grav, UA 1.025 1.010 - 1.025   Blood, UA Large    pH, UA 6.0 5.0 - 8.0   Protein, UA Negative Negative   Urobilinogen, UA 0.2 0.2 or 1.0 E.U./dL   Nitrite, UA Negative    Leukocytes, UA Moderate (2+) (A) Negative   Appearance     Odor          Assessment & Plan Urinary tract infection with hematuria Suspected UTI with hematuria, dysuria, and lower abdominal pressure. Leukocytosis and hematuria present. Differential includes UTI, kidney stones, or other causes of hematuria. Empirical treatment with cephalosporins chosen based on local susceptibility patterns. - Prescribed cephalosporin antibiotics for 5-7 days. - Ordered urine microscopy and culture. - Advised to monitor for fever or worsening symptoms and report if she occurs. - Encouraged increased fluid intake, including cranberry juice. - Advised to consume probiotics such as yogurt or kefir to prevent yeast infection. - Instructed to report any signs of yeast infection, such as vaginal discharge or itchiness. - Advised to use over-the-counter Monistat if yeast infection symptoms develop.  Urinary incontinence Chronic urinary incontinence managed with detrol  which does not work very well. Current symptoms may overlap with UTI symptoms. - Continue current medication for urinary incontinence. Follow-up with pcp for re-assessment  Hematuria Follow-up with pcp after completion of treatment for uti Consider a referral to urology if symptoms persist or worsen  Orders Placed This Encounter  Procedures   Urine Culture   Urine Microscopic   POCT Urinalysis Dipstick    No follow-ups on file.   The patient was advised to call back or seek an in-person evaluation if the  symptoms worsen or if the condition fails to improve as anticipated.  I discussed the assessment and treatment plan with the patient. The patient was provided an opportunity to ask questions and all were answered. The patient agreed with the plan and demonstrated an understanding of the instructions.  I, Swannie Milius, PA-C have reviewed all documentation for this visit. The documentation on 01/29/2024  for the exam, diagnosis, procedures, and orders are all accurate and complete.  Jolynn Spencer, [facility], MMS [facility] [phone] (phone) [phone] (fax)  [facility]

## 2024-01-30 LAB — URINALYSIS, MICROSCOPIC ONLY
Bacteria, UA: NONE SEEN
Casts: NONE SEEN /LPF

## 2024-01-30 LAB — SPECIMEN STATUS REPORT

## 2024-02-01 ENCOUNTER — Ambulatory Visit: Payer: Self-pay | Admitting: Physician Assistant

## 2024-02-01 LAB — SPECIMEN STATUS REPORT

## 2024-02-01 LAB — URINE CULTURE

## 2024-02-25 IMAGING — MG MM DIGITAL SCREENING BILAT W/ TOMO AND CAD
8 series · 8 of 24 positions shown · non-contrast
Comparison: Previous exam(s).

CLINICAL DATA: Screening.

EXAM:
DIGITAL SCREENING BILATERAL MAMMOGRAM WITH TOMOSYNTHESIS AND CAD
TECHNIQUE: Bilateral screening digital craniocaudal and mediolateral oblique
mammograms were obtained. Bilateral screening digital breast
tomosynthesis was performed. The images were evaluated with
computer-aided detection.

[L MLO synth-2D]
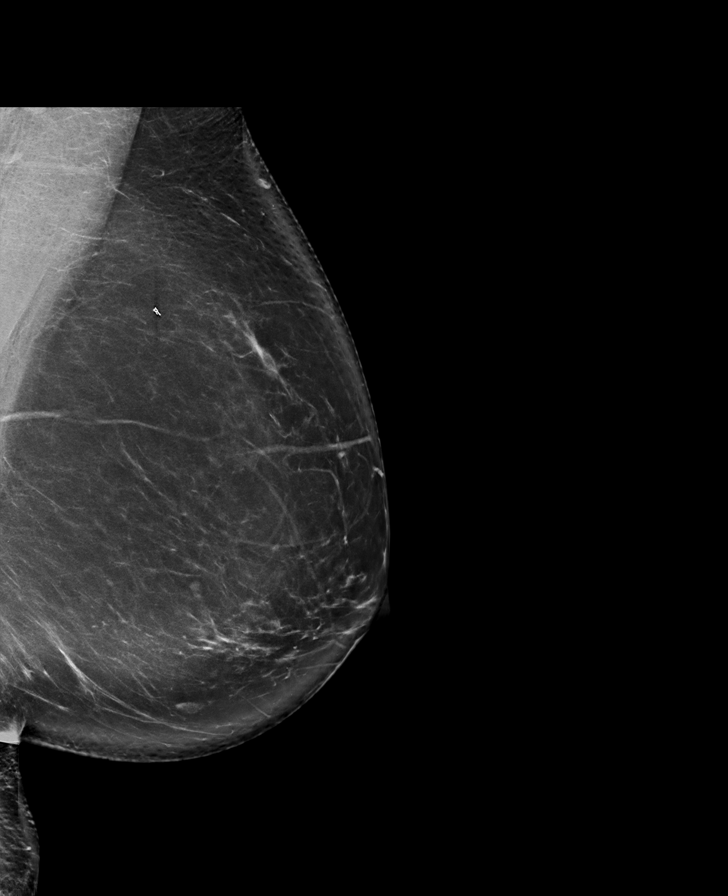

[L CC synth-2D]
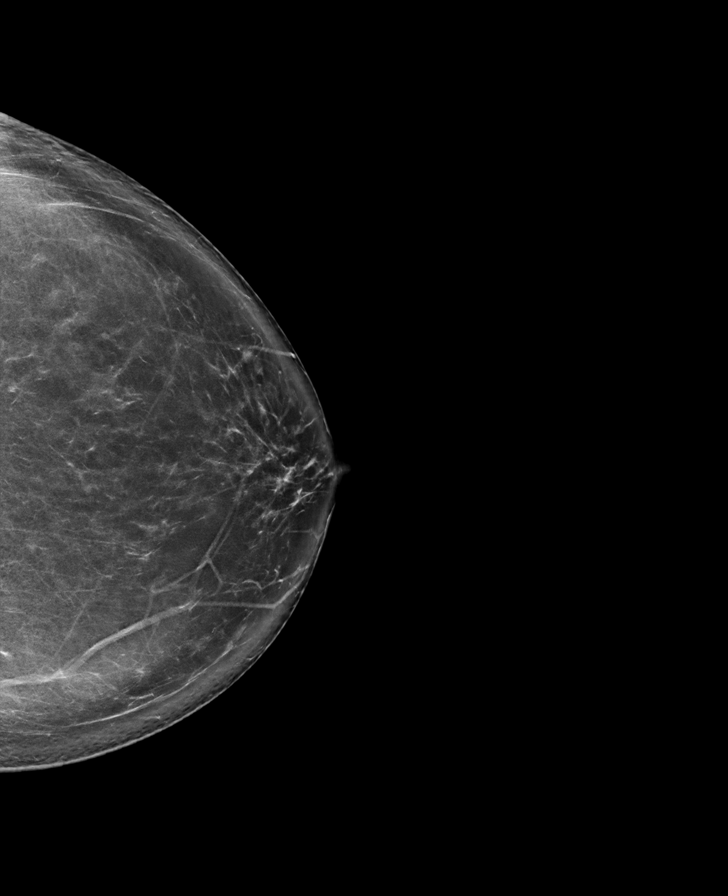

[R CC synth-2D]
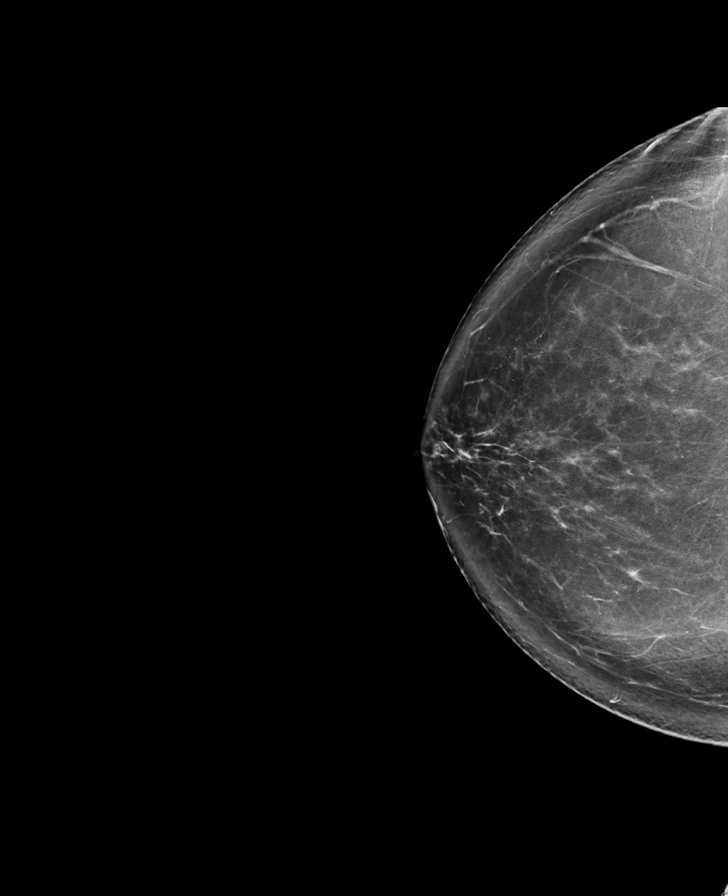

[R MLO synth-2D]
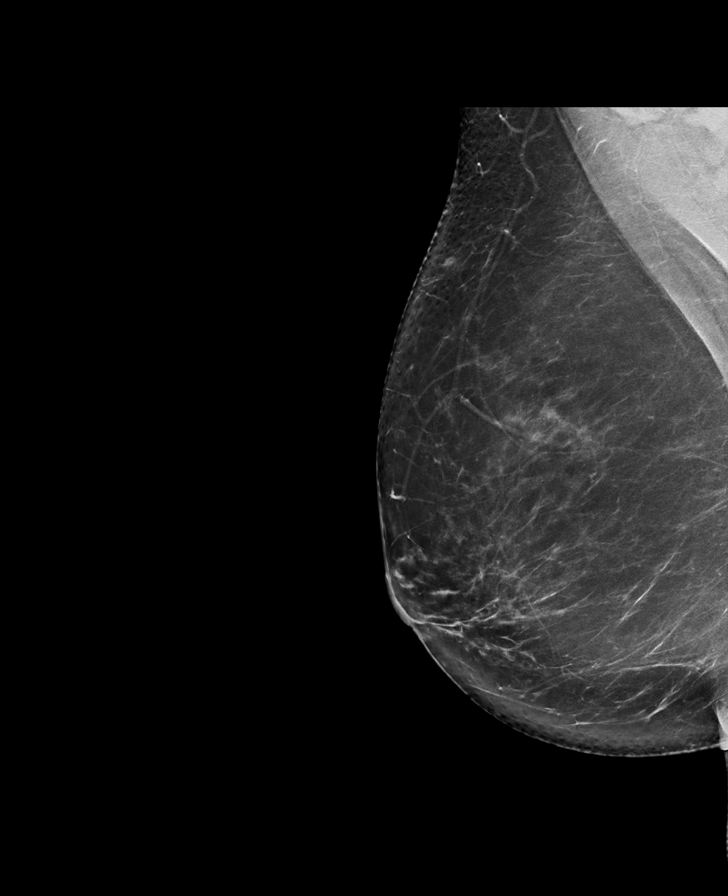

[R MLO tomo · tomo slice 47/93.0]
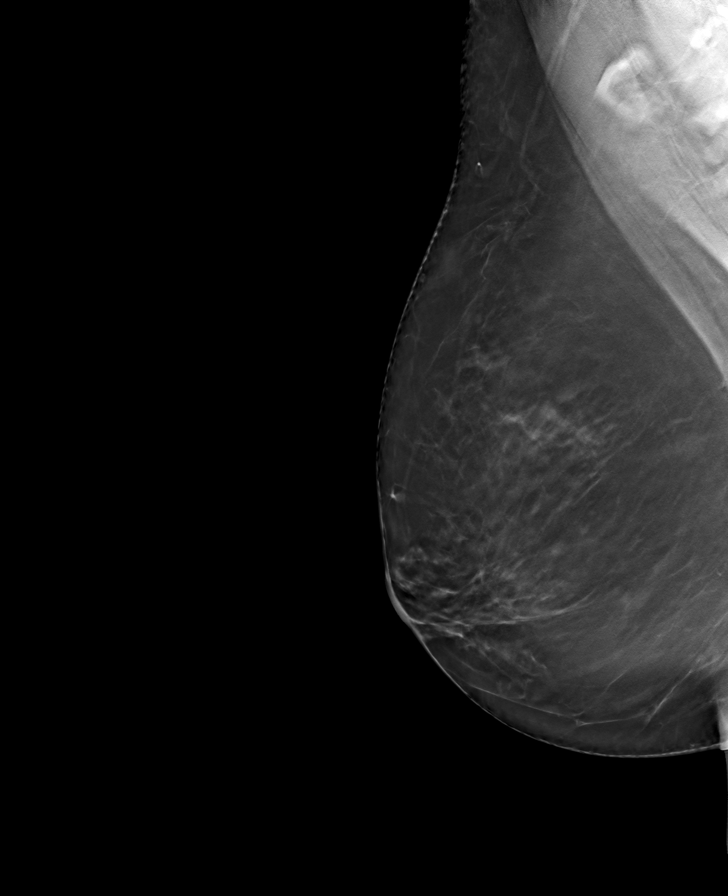

[L MLO tomo · tomo slice 49/96.0]
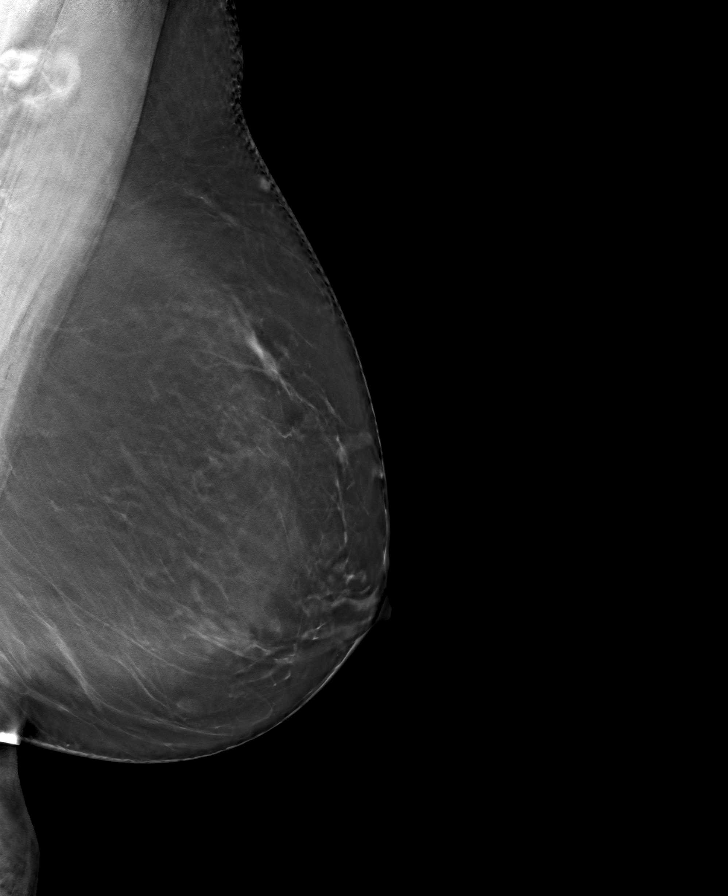

[R CC tomo · tomo slice 47/93.0]
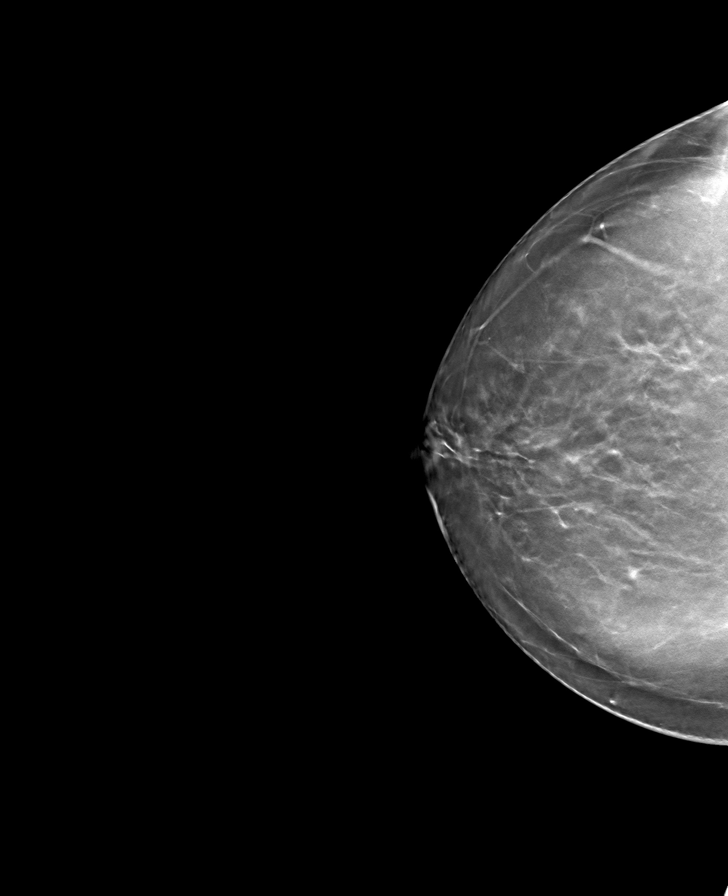

[L CC tomo · tomo slice 45/89.0]
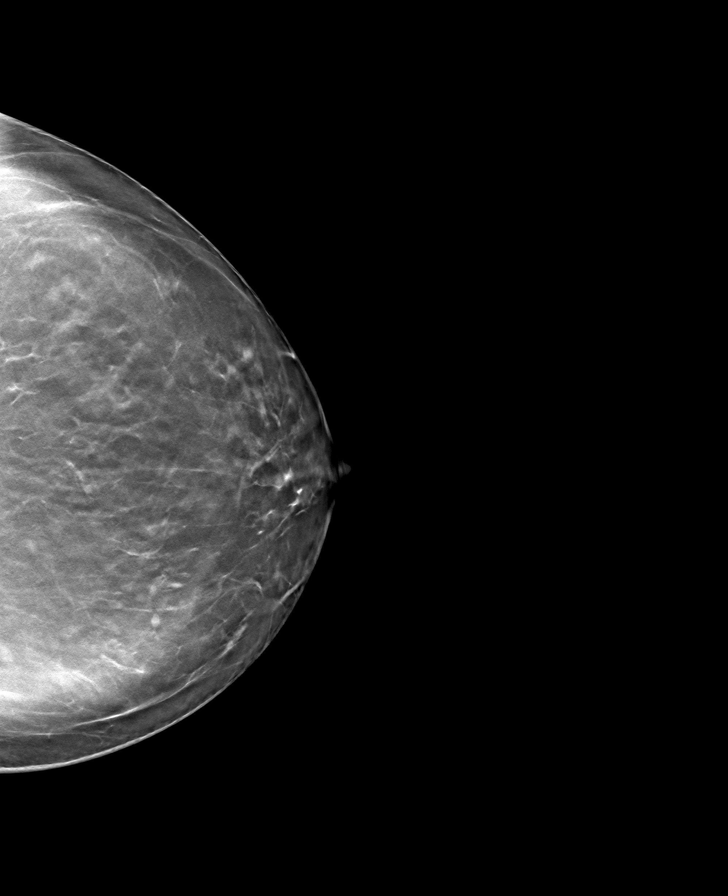

[8 of 24 positions shown; findings below may reference images not displayed]

ACR Breast Density Category b: There are scattered areas of
fibroglandular density.
FINDINGS: There are no findings suspicious for malignancy.
IMPRESSION: No mammographic evidence of malignancy. A result letter of this
screening mammogram will be mailed directly to the patient.

RECOMMENDATION:
Screening mammogram in one year. (Code:51-O-LD2)

BI-RADS CATEGORY  1: Negative.

## 2024-02-26 ENCOUNTER — Telehealth: Payer: Self-pay | Admitting: Diagnostic Neuroimaging

## 2024-02-26 NOTE — Telephone Encounter (Signed)
 Pt called to Cancel appt  due to  finding new MD  Appt Cancled

## 2024-03-06 ENCOUNTER — Ambulatory Visit: Admitting: Diagnostic Neuroimaging

## 2024-04-16 ENCOUNTER — Encounter: Payer: 59 | Admitting: Family Medicine

## 2024-05-08 ENCOUNTER — Ambulatory Visit: Admitting: Dermatology
